# Patient Record
Sex: Female | Born: 1980 | Race: White | Hispanic: No | Smoking: Former smoker
Health system: Southern US, Community
[De-identification: ages and names within clinical notes are randomized; demographics above are authoritative.]

## PROBLEM LIST (undated history)

## (undated) DIAGNOSIS — F53 Postpartum depression: Secondary | ICD-10-CM

## (undated) DIAGNOSIS — G8929 Other chronic pain: Secondary | ICD-10-CM

## (undated) DIAGNOSIS — O99345 Other mental disorders complicating the puerperium: Secondary | ICD-10-CM

## (undated) DIAGNOSIS — F419 Anxiety disorder, unspecified: Secondary | ICD-10-CM

## (undated) DIAGNOSIS — R197 Diarrhea, unspecified: Secondary | ICD-10-CM

## (undated) DIAGNOSIS — T4145XA Adverse effect of unspecified anesthetic, initial encounter: Secondary | ICD-10-CM

## (undated) DIAGNOSIS — R519 Headache, unspecified: Secondary | ICD-10-CM

## (undated) DIAGNOSIS — S82891A Other fracture of right lower leg, initial encounter for closed fracture: Secondary | ICD-10-CM

## (undated) DIAGNOSIS — R51 Headache: Secondary | ICD-10-CM

## (undated) DIAGNOSIS — T8859XA Other complications of anesthesia, initial encounter: Secondary | ICD-10-CM

## (undated) DIAGNOSIS — M1990 Unspecified osteoarthritis, unspecified site: Secondary | ICD-10-CM

## (undated) DIAGNOSIS — M545 Low back pain, unspecified: Secondary | ICD-10-CM

## (undated) HISTORY — DX: Postpartum depression: F53.0

## (undated) HISTORY — PX: BREAST SURGERY: SHX581

## (undated) HISTORY — PX: FRACTURE SURGERY: SHX138

## (undated) HISTORY — DX: Diarrhea, unspecified: R19.7

## (undated) HISTORY — DX: Anxiety disorder, unspecified: F41.9

## (undated) HISTORY — DX: Other mental disorders complicating the puerperium: O99.345

---
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
## 2005-09-06 HISTORY — PX: LAPAROSCOPIC CHOLECYSTECTOMY: SUR755

## 2013-06-18 ENCOUNTER — Ambulatory Visit (HOSPITAL_BASED_OUTPATIENT_CLINIC_OR_DEPARTMENT_OTHER)
Admission: RE | Admit: 2013-06-18 | Discharge: 2013-06-18 | Disposition: A | Discharge status: Home / Self Care | Payer: BC Managed Care – PPO | Source: Ambulatory Visit | Attending: Family | Admitting: Family

## 2013-06-18 ENCOUNTER — Other Ambulatory Visit (HOSPITAL_COMMUNITY)
Admission: RE | Admit: 2013-06-18 | Discharge: 2013-06-18 | Disposition: A | Discharge status: Home / Self Care | Payer: BC Managed Care – PPO | Source: Ambulatory Visit | Attending: Family | Admitting: Family

## 2013-06-18 ENCOUNTER — Encounter: Payer: Self-pay | Admitting: Family

## 2013-06-18 ENCOUNTER — Ambulatory Visit (INDEPENDENT_AMBULATORY_CARE_PROVIDER_SITE_OTHER): Payer: BC Managed Care – PPO | Admitting: Family

## 2013-06-18 VITALS — BP 110/80 | HR 86 | Temp 98.5°F | Resp 18 | Ht 64.5 in | Wt 244.1 lb

## 2013-06-18 DIAGNOSIS — M51379 Other intervertebral disc degeneration, lumbosacral region without mention of lumbar back pain or lower extremity pain: Secondary | ICD-10-CM | POA: Insufficient documentation

## 2013-06-18 DIAGNOSIS — M5137 Other intervertebral disc degeneration, lumbosacral region: Secondary | ICD-10-CM | POA: Insufficient documentation

## 2013-06-18 DIAGNOSIS — Z803 Family history of malignant neoplasm of breast: Secondary | ICD-10-CM

## 2013-06-18 DIAGNOSIS — N760 Acute vaginitis: Secondary | ICD-10-CM | POA: Insufficient documentation

## 2013-06-18 DIAGNOSIS — Z113 Encounter for screening for infections with a predominantly sexual mode of transmission: Secondary | ICD-10-CM | POA: Insufficient documentation

## 2013-06-18 DIAGNOSIS — N949 Unspecified condition associated with female genital organs and menstrual cycle: Secondary | ICD-10-CM

## 2013-06-18 DIAGNOSIS — Z01419 Encounter for gynecological examination (general) (routine) without abnormal findings: Secondary | ICD-10-CM

## 2013-06-18 DIAGNOSIS — R102 Pelvic and perineal pain: Secondary | ICD-10-CM

## 2013-06-18 DIAGNOSIS — R1032 Left lower quadrant pain: Secondary | ICD-10-CM | POA: Insufficient documentation

## 2013-06-18 DIAGNOSIS — Z23 Encounter for immunization: Secondary | ICD-10-CM

## 2013-06-18 DIAGNOSIS — F411 Generalized anxiety disorder: Secondary | ICD-10-CM

## 2013-06-18 LAB — POCT URINALYSIS DIPSTICK
Glucose, UA: NEGATIVE
Nitrite, UA: NEGATIVE
Urobilinogen, UA: 0.2
pH, UA: 5

## 2013-06-18 MED ORDER — IOHEXOL 300 MG/ML  SOLN
100.0000 mL | Freq: Once | INTRAMUSCULAR | Status: AC | PRN
  Administered 2013-06-18: 100 mL via INTRAVENOUS

## 2013-06-18 NOTE — Assessment & Plan Note (Signed)
Stable on effexor. Continue same.  

## 2013-06-18 NOTE — Assessment & Plan Note (Signed)
She is s/p c section x 2. She is morbidly obese limiting physical exam.  Abdominal wall hernia is a possibility. Pap was performed today. Will also send for GC/Chlamydia, ua- urine culture if positive.  Check for gardnerella, candida, trich.  Send for CT abdomen and pelvis to further evaluate abdominal pain.

## 2013-06-18 NOTE — Progress Notes (Signed)
Subjective:    Patient ID: Gina James, female    DOB: 1981-03-18, 32 y.o.   MRN: [id number]  HPI  Ms. Gina James is a 32 yr old female who presents today to establish care. Her chief complaint today is that she has had constant abdominal cramping since July. Of note, she had a laparoscopy in the past to evaluate for possible endometriosis. She is requesting a referral to GYN.    She reports lower abdominal pain. Described as pressure, cramping. Worse if she tries to urinate, have bowel movement or if she lays on her stomach. Denies associated blood in stool or urine.  Discomfort is relieved after urination/defication.  Denies associated abdominal bulging.    Anxiety- diagnosed in 2006. Had post partum depression.  Was placed on effexor.  Report that her anxiety and depression are well controlled.    Review of Systems  Constitutional: Negative for fever.  HENT: Negative for rhinorrhea.   Respiratory: Negative for cough and shortness of breath.   Cardiovascular: Negative for chest pain.  Gastrointestinal: Negative for nausea, vomiting, diarrhea and constipation.  Genitourinary: Negative for dysuria.  Musculoskeletal: Negative for myalgias.       Reports some knee pain  Skin: Negative for rash.  Neurological:       + HA's.  Reports that she gets weekly headaches.  Has had migraines in the past.  Uses aleve for her headaches.   Hematological: Negative for adenopathy.   Past Medical History  Diagnosis Date  . Anxiety   . Post partum depression     History   Social History  . Marital Status: Married    Spouse Name: N/A    Number of Children: N/A  . Years of Education: N/A   Occupational History  . Not on file.   Social History Main Topics  . Smoking status: Former Smoker -- 8 years    Types: Cigarettes    Quit date: 09/06/2004  . Smokeless tobacco: Never Used  . Alcohol Use: Yes     Comment: once a month or less  . Drug Use: Not on file  . Sexual Activity: Not on file    Other Topics Concern  . Not on file   Social History Narrative   2 children- son 2007- Gina James,  Gina James 2009   Homemaker   Married   Completed tech school   Enjoys shopping, spending time with friends, reading.           Past Surgical History  Procedure Laterality Date  . Cholecystectomy  2007  . Other surgical history  1997    "laproscopy to check for endometriosis"    Family History  Problem Relation Age of Onset  . Diabetes Father   . Cancer Sister 30    breast, had chemo/double mastectomy  . Heart disease Maternal Grandfather   . Heart disease Paternal Grandfather     Allergies not on file  No current outpatient prescriptions on file prior to visit.   No current facility-administered medications on file prior to visit.    BP 110/80  Pulse 86  Temp(Src) 98.5 F (36.9 C) (Oral)  Resp 18  Ht 5' 4.5" (1.638 m)  Wt 244 lb 1.3 oz (110.714 kg)  BMI 41.26 kg/m2  SpO2 99%  LMP 06/01/2013       Objective:   Physical Exam  Constitutional: She is oriented to person, place, and time. She appears well-developed and well-nourished. No distress.  HENT:  Head: Normocephalic and atraumatic.  Eyes:  No scleral icterus.  Cardiovascular: Normal rate and regular rhythm.   No murmur heard. Pulmonary/Chest: Effort normal and breath sounds normal. No respiratory distress. She has no wheezes. She has no rales. She exhibits no tenderness.  Abdominal: Soft. Bowel sounds are normal. There is tenderness in the right lower quadrant and left lower quadrant.  Genitourinary:  Neg CMT Normal adnexa Small amount of white discharge noted in vagina No lesions Cervix is normal.    Musculoskeletal: She exhibits no edema.  Lymphadenopathy:    She has no cervical adenopathy.  Neurological: She is alert and oriented to person, place, and time.  Skin: Skin is warm and dry.  Psychiatric: She has a normal mood and affect. Her behavior is normal. Judgment and thought content normal.           Assessment & Plan:

## 2013-06-18 NOTE — Addendum Note (Signed)
Addended by: Mervin Kung A on: 06/18/2013 04:26 PM   Modules accepted: Orders

## 2013-06-18 NOTE — Patient Instructions (Signed)
Please complete your CT scan on the first floor. You will be contacted about your referral to GYN. Follow up in 1 month for a fasting physical. Welcome to [facility]!

## 2013-06-20 LAB — URINE CULTURE
Colony Count: NO GROWTH
Organism ID, Bacteria: NO GROWTH

## 2013-06-21 ENCOUNTER — Telehealth: Payer: Self-pay | Admitting: Family

## 2013-06-21 MED ORDER — METRONIDAZOLE 0.75 % VA GEL: 1.0000 | Freq: Every day | VAGINAL | Status: DC

## 2013-06-21 NOTE — Telephone Encounter (Signed)
Please call patient and let her know pap is negative.  GC/Chlamydia testing negative.  + bacterial vaginosis. Rx with metrogel.

## 2013-06-22 NOTE — Telephone Encounter (Signed)
Notifed pt and she voices understanding. Concerned that she may be starting her menstrual cycle in the next 3 days. Per verbal from Provider, advised pt that is shouldn't interfere with medication absorption as long as she doesn't use tampons while on medication. Pt declines oral form due to increased risk of GI side effects.

## 2013-06-29 ENCOUNTER — Telehealth: Payer: Self-pay | Admitting: Genetic Counselor

## 2013-06-29 NOTE — Telephone Encounter (Signed)
Called pt to schedule genetic appt per pt she wants to hold off on scheduling appt after speaking with MD. Jeanene Erb referring office to make aware spoke with Sue Lush.

## 2013-07-17 ENCOUNTER — Encounter: Payer: BC Managed Care – PPO | Admitting: Family

## 2013-09-03 ENCOUNTER — Ambulatory Visit (INDEPENDENT_AMBULATORY_CARE_PROVIDER_SITE_OTHER): Payer: BC Managed Care – PPO | Admitting: Physician Assistant

## 2013-09-03 ENCOUNTER — Encounter: Payer: Self-pay | Admitting: Physician Assistant

## 2013-09-03 VITALS — BP 112/76 | HR 79 | Temp 98.2°F | Resp 16 | Ht 64.5 in | Wt 247.5 lb

## 2013-09-03 DIAGNOSIS — M545 Low back pain, unspecified: Secondary | ICD-10-CM

## 2013-09-03 DIAGNOSIS — M5380 Other specified dorsopathies, site unspecified: Secondary | ICD-10-CM

## 2013-09-03 DIAGNOSIS — M62830 Muscle spasm of back: Secondary | ICD-10-CM

## 2013-09-03 MED ORDER — METAXALONE 800 MG PO TABS: 800.0000 mg | ORAL_TABLET | Freq: Three times a day (TID) | ORAL | Status: DC

## 2013-09-03 MED ORDER — TRAMADOL HCL 50 MG PO TABS: 50.0000 mg | ORAL_TABLET | Freq: Two times a day (BID) | ORAL | Status: DC | PRN

## 2013-09-03 NOTE — Progress Notes (Signed)
Pre visit review using our clinic review tool, if applicable. No additional management support is needed unless otherwise documented below in the visit note/SLS  

## 2013-09-03 NOTE — Patient Instructions (Signed)
Please continue with Aleve and [place].  Avoid heavy lifting or over use.  Continue home TENS unit.  You will be contacted by Physical therapy.  Skelaxin as needed.  Tramadol to be reserved only for severe pain if needed.  Please call or return to clinic if symptoms not improving.

## 2013-09-04 DIAGNOSIS — M545 Low back pain, unspecified: Secondary | ICD-10-CM | POA: Insufficient documentation

## 2013-09-04 NOTE — Assessment & Plan Note (Signed)
Rx Skelaxin. Continue Aleve as needed for pain. Rx tramadol for severe pain only. Apply topical icy hot or Aspercreme to affected area. Use TENS unit as prescribed. Will place referral to physical therapy. Encouraged exercise and weight loss after this flare-up has resolved. Likely to have recurrences of symptoms giving morbid obesity.

## 2013-09-04 NOTE — Progress Notes (Signed)
Patient ID: Gina James, female   DOB: 06/17/1981, 32 y.o.   MRN: [id number]  Patient presents to clinic today complaining of low back pain. Patient states this issue is chronic, starting after she gave birth to her child in 2009. Patient states she has occasional flareups of back pain that requires muscle relaxer and physical therapy.  Patient is out of her prescription for Skelaxin. Patient also states she's not been set up with physical therapy since moving to the area. Patient states that pain is constant and worse with motion. Denies heavy lifting or trauma. 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 Patient has taken Aleve for her symptoms with minimal relief of pain. Patient denies weakness, numbness or tingling of lower extremities. Denies radiation of back pain into her extremities.  Past Medical History  Diagnosis Date  . Anxiety   . Post partum depression     Current Outpatient Prescriptions on File Prior to Visit  Medication Sig Dispense Refill  . venlafaxine XR (EFFEXOR-XR) 150 MG 24 hr capsule Take 150 mg by mouth daily.      Marland Kitchen venlafaxine XR (EFFEXOR-XR) 37.5 MG 24 hr capsule Take 37.5 mg by mouth daily.       No current facility-administered medications on file prior to visit.    Allergies  Allergen Reactions  . Amoxicillin Hives  . Ceclor [Cefaclor] Hives  . Penicillins Hives  . Sulfa Antibiotics Hives  . Zithromax [Azithromycin] Hives    Family History  Problem Relation Age of Onset  . Diabetes Father   . Cancer Sister 30    breast, had chemo/double mastectomy  . Heart disease Maternal Grandfather   . Heart disease Paternal Grandfather     History   Social History  . Marital Status: Married    Spouse Name: N/A    Number of Children: N/A  . Years of Education: N/A   Social History Main Topics  . Smoking status: Former Smoker -- 8 years    Types: Cigarettes    Quit  date: 09/06/2004  . Smokeless tobacco: Never Used  . Alcohol Use: Yes     Comment: once a month or less  . Drug Use: None  . Sexual Activity: None   Other Topics Concern  . None   Social History Narrative   2 children- son 2007- Sean,  Taryn 2009   Homemaker   Married   Completed tech school   Enjoys shopping, spending time with friends, reading.          Review of Systems - See HPI.  All other ROS are negative.  Filed Vitals:   09/03/13 1111  BP: 112/76  Pulse: 79  Temp: 98.2 F (36.8 C)  Resp: 16   Physical Exam  Vitals reviewed. Constitutional: She is oriented to person, place, and time.  Well-developed, morbidly obese Caucasian female in no acute distress.  HENT:  Head: Normocephalic and atraumatic.  Eyes: Conjunctivae are normal.  Neck: Neck supple.  Cardiovascular: Normal rate, regular rhythm, normal heart sounds and intact distal pulses.   Pulmonary/Chest: Effort normal and breath sounds normal.  Musculoskeletal:       Right hip: Normal.       Left hip: Normal.       Cervical back: Normal.       Thoracic back: Normal.       Lumbar back: She exhibits tenderness, pain and spasm. She  exhibits normal range of motion, no bony tenderness and no deformity.       Right upper leg: Normal.       Left upper leg: Normal.  Neurological: She is alert and oriented to person, place, and time. No cranial nerve deficit. Gait normal.  Skin: Skin is warm and dry. No rash noted.  Psychiatric: Affect normal.    Recent Results (from the past 2160 hour(s))  POCT URINALYSIS DIPSTICK     Status: None   Collection Time    06/18/13  4:17 PM      Result Value Range   Color, UA dark yellow     Clarity, UA cloudy     Glucose, UA negative     Bilirubin, UA moderate     Ketones, UA moderate     Spec Grav, UA >=1.030     Blood, UA trace     pH, UA 5.0     Protein, UA trace     Urobilinogen, UA 0.2     Nitrite, UA negative     Leukocytes, UA Negative    POCT URINE HCG BY  VISUAL COLOR COMPARISON TESTS     Status: None   Collection Time    06/18/13  4:17 PM      Result Value Range   Preg Test, Ur Negative    URINE CULTURE     Status: None   Collection Time    06/18/13  4:21 PM      Result Value Range   Colony Count NO GROWTH     Organism ID, Bacteria NO GROWTH      Assessment/Plan: Low back pain Rx Skelaxin. Continue Aleve as needed for pain. Rx tramadol for severe pain only. Apply topical icy hot or Aspercreme to affected area. Use TENS unit as prescribed. Will place referral to physical therapy. Encouraged exercise and weight loss after this flare-up has resolved. Likely to have recurrences of symptoms giving morbid obesity.

## 2013-09-07 ENCOUNTER — Telehealth: Payer: Self-pay | Admitting: Family

## 2013-09-07 DIAGNOSIS — M545 Low back pain, unspecified: Secondary | ICD-10-CM

## 2013-09-07 NOTE — Telephone Encounter (Signed)
I can place a referral to Neurosurgery but it will be elective.  Patient did not have any neurological signs/symptoms at her visit, nor does she have imaging within our system except for a CT abdomen/pelvis which incidentally picked up arthritis changes of her lower spine.  Neurosurgeon will most likely not see her without her having some imaging to review.  I will still place the referral but my suggestion to the patient would be for her to return to clinic if symptoms not improving or if new symptoms have developed.  If pain is severe and she cannot get appointment, she should proceed to the ER.

## 2013-09-07 NOTE — Telephone Encounter (Signed)
Patient states that she has been taking the medication that was given to her but her back is not getting any better.

## 2013-09-07 NOTE — Telephone Encounter (Signed)
Called pt and informed her that we will need records from prior ortho before referral. Pt is request to see a Neurosurgeon instead of ortho.

## 2013-09-07 NOTE — Telephone Encounter (Signed)
Pt called back, informed pt. States her therapy does not start till Monday. She is requesting to be referred to see a specialists. Please advise.

## 2013-09-07 NOTE — Telephone Encounter (Signed)
Low back pain can take up to a few weeks to completely dissipate. Can take 2 of the tramadol every 12 hours instead of 1.  Has she been scheduled for physical therapy yet?  If pain is not improving over the weekend, then we will need to have her come back in for re-assessment and imaging.

## 2013-09-07 NOTE — Telephone Encounter (Signed)
Patient informed, understood & agreed; appt scheduled Mon, 01.05.15 at 1:15p for reassessment/SLS

## 2013-09-07 NOTE — Telephone Encounter (Signed)
Referral given.  Patient will be contacted by Specialist.

## 2013-09-10 ENCOUNTER — Ambulatory Visit (INDEPENDENT_AMBULATORY_CARE_PROVIDER_SITE_OTHER): Payer: BC Managed Care – PPO | Admitting: Physician Assistant

## 2013-09-10 ENCOUNTER — Encounter: Payer: Self-pay | Admitting: Physician Assistant

## 2013-09-10 VITALS — BP 106/72 | HR 84 | Temp 98.6°F | Resp 16 | Ht 64.5 in | Wt 243.8 lb

## 2013-09-10 DIAGNOSIS — M79605 Pain in left leg: Secondary | ICD-10-CM

## 2013-09-10 DIAGNOSIS — M545 Low back pain, unspecified: Secondary | ICD-10-CM

## 2013-09-10 DIAGNOSIS — M5380 Other specified dorsopathies, site unspecified: Secondary | ICD-10-CM

## 2013-09-10 DIAGNOSIS — M79604 Pain in right leg: Secondary | ICD-10-CM

## 2013-09-10 DIAGNOSIS — M62830 Muscle spasm of back: Secondary | ICD-10-CM

## 2013-09-10 MED ORDER — METHYLPREDNISOLONE (PAK) 4 MG PO TABS: ORAL_TABLET | ORAL | Status: DC

## 2013-09-10 MED ORDER — METAXALONE 800 MG PO TABS: 800.0000 mg | ORAL_TABLET | Freq: Three times a day (TID) | ORAL | Status: DC

## 2013-09-10 NOTE — Progress Notes (Signed)
Pre visit review using our clinic review tool, if applicable. No additional management support is needed unless otherwise documented below in the visit note/SLS  

## 2013-09-10 NOTE — Patient Instructions (Signed)
Continue muscle relaxer as prescribed.  Tramadol at night.  Tylenol during the day.  Take steroid pack as prescribed.  Follow-up with physical therapy tomorrow if you are able.  Will obtain an MRI of lumbar spine. Will change status of Neurology/Neurosurgery referral once we have your medical records.  If you develop numbness in your groin, fecal or urinary incontinence, inability to urinate, or severe pain, please proceed immediately to the ER.

## 2013-09-10 NOTE — Progress Notes (Signed)
Subjective:    Patient ID: Gina James, female    DOB: 03-Jan-1981, 33 y.o.   MRN: [id number]  HPI Patient presents to clinic today complaining of continued low back pain is now radiating into her hips and lower extremities bilaterally. Patient has been taking her skull ask him with some relief of symptoms. Has prescription for tramadol, but states it makes her drowsy, so she is only taken it before bedtime. Patient denies any recent trauma or reinjury since her last visit. Patient denies saddle anesthesia, urinary retention/incontinence or bowel retention/incontinence. Patient starts physical therapy tomorrow for her back pain.  Past Medical History  Diagnosis Date  . Anxiety   . Post partum depression    Current Outpatient Prescriptions on File Prior to Visit  Medication Sig Dispense Refill  . traMADol (ULTRAM) 50 MG tablet Take 1 tablet (50 mg total) by mouth every 12 (twelve) hours as needed for severe pain.  30 tablet  0  . venlafaxine XR (EFFEXOR-XR) 150 MG 24 hr capsule Take 150 mg by mouth daily.      Marland Kitchen venlafaxine XR (EFFEXOR-XR) 37.5 MG 24 hr capsule Take 37.5 mg by mouth daily.       No current facility-administered medications on file prior to visit.   Allergies  Allergen Reactions  . Amoxicillin Hives  . Ceclor [Cefaclor] Hives  . Penicillins Hives  . Sulfa Antibiotics Hives  . Zithromax [Azithromycin] Hives   Family History  Problem Relation Age of Onset  . Diabetes Father   . Cancer Sister 30    breast, had chemo/double mastectomy  . Heart disease Maternal Grandfather   . Heart disease Paternal Grandfather    History   Social History  . Marital Status: Married    Spouse Name: N/A    Number of Children: N/A  . Years of Education: N/A   Social History Main Topics  . Smoking status: Former Smoker -- 8 years    Types: Cigarettes    Quit date: 09/06/2004  . Smokeless tobacco: Never Used  . Alcohol Use: Yes     Comment: once a month or less  . Drug Use:  None  . Sexual Activity: None   Other Topics Concern  . None   Social History Narrative   2 children- son 2007- Sean,  Taryn 2009   Homemaker   Married   Completed tech school   Enjoys shopping, spending time with friends, reading.          Review of Systems  Constitutional: Negative.   Respiratory: Negative.   Cardiovascular: Negative.   Genitourinary: Negative.   Musculoskeletal: Positive for back pain. Negative for myalgias.  Neurological: Negative for syncope, weakness and numbness.      Objective:   Physical Exam  Vitals reviewed. Constitutional: She is oriented to person, place, and time. She appears well-developed and well-nourished.  HENT:  Head: Normocephalic and atraumatic.  Eyes: Conjunctivae and EOM are normal.  Neck: Neck supple.  Cardiovascular: Normal rate, regular rhythm, normal heart sounds and intact distal pulses.   Pulmonary/Chest: Breath sounds normal. No respiratory distress. She has no wheezes. She has no rales. She exhibits no tenderness.  Musculoskeletal:       Lumbar back: She exhibits tenderness, pain and spasm. She exhibits no bony tenderness and no swelling.       Right upper leg: She exhibits no tenderness, no bony tenderness and no swelling.       Left upper leg: She exhibits no tenderness, no  bony tenderness and no swelling.  Pain is worse with movement. Pain is somewhat alleviated by lying down.  Neurological: She is alert and oriented to person, place, and time. She has normal reflexes. No cranial nerve deficit. Coordination normal.  Skin: Skin is warm and dry. No rash noted.  Psychiatric: She has a normal mood and affect.    Assessment & Plan:  See assessment and plan notes.

## 2013-09-11 ENCOUNTER — Ambulatory Visit (HOSPITAL_BASED_OUTPATIENT_CLINIC_OR_DEPARTMENT_OTHER)
Admission: RE | Admit: 2013-09-11 | Discharge: 2013-09-11 | Disposition: A | Discharge status: Home / Self Care | Payer: BC Managed Care – PPO | Source: Ambulatory Visit | Attending: Physician Assistant | Admitting: Physician Assistant

## 2013-09-11 ENCOUNTER — Ambulatory Visit: Payer: BC Managed Care – PPO | Attending: Physician Assistant | Admitting: Physical Therapy

## 2013-09-11 ENCOUNTER — Telehealth: Payer: Self-pay | Admitting: Physician Assistant

## 2013-09-11 DIAGNOSIS — IMO0001 Reserved for inherently not codable concepts without codable children: Secondary | ICD-10-CM | POA: Insufficient documentation

## 2013-09-11 DIAGNOSIS — M545 Low back pain, unspecified: Secondary | ICD-10-CM | POA: Insufficient documentation

## 2013-09-11 DIAGNOSIS — M79605 Pain in left leg: Secondary | ICD-10-CM

## 2013-09-11 DIAGNOSIS — R293 Abnormal posture: Secondary | ICD-10-CM | POA: Insufficient documentation

## 2013-09-11 DIAGNOSIS — M5126 Other intervertebral disc displacement, lumbar region: Secondary | ICD-10-CM | POA: Insufficient documentation

## 2013-09-11 DIAGNOSIS — M79604 Pain in right leg: Secondary | ICD-10-CM

## 2013-09-11 DIAGNOSIS — M6281 Muscle weakness (generalized): Secondary | ICD-10-CM | POA: Insufficient documentation

## 2013-09-11 HISTORY — DX: Pain in right leg: M79.604

## 2013-09-11 HISTORY — DX: Pain in left leg: M79.605

## 2013-09-11 NOTE — Assessment & Plan Note (Signed)
Will obtain MRI. Referral to  orthopedics or neurosurgery to placed once imaging results are reviewed. Depo-Medrol injection given in clinic. Rx Medrol Dosepak. Refill Skelaxin and tramadol. Continue physical therapy. Avoid heavy lifting or over exertion. Patient educated on alarm symptoms and when to proceed to emergency department.

## 2013-09-11 NOTE — Telephone Encounter (Signed)
Please inform patient that MRI does show evidence of nerve compression at her sacrum that could be cause of her symptoms.  Also shows significant degenerative changes (arthritis).  Will change referral to Neurosurgery from Elective to Routine. She should be contacted by their office. She should continue physical therapy and steroid pack as prescribed.

## 2013-09-12 MED ORDER — METHYLPREDNISOLONE ACETATE 80 MG/ML IJ SUSP
80.0000 mg | Freq: Once | INTRAMUSCULAR | Status: AC
  Administered 2013-09-10: 80 mg via INTRAMUSCULAR

## 2013-09-12 NOTE — Addendum Note (Signed)
Addended by: Rockwell Germany on: 09/12/2013 08:47 AM   Modules accepted: Orders

## 2013-09-12 NOTE — Telephone Encounter (Signed)
Patient informed, understood & agreed/SLS  

## 2013-09-14 ENCOUNTER — Ambulatory Visit: Payer: BC Managed Care – PPO | Admitting: Physical Therapy

## 2013-09-18 ENCOUNTER — Ambulatory Visit: Payer: BC Managed Care – PPO | Admitting: Physical Therapy

## 2013-09-20 ENCOUNTER — Other Ambulatory Visit: Payer: Self-pay | Admitting: Neurosurgery

## 2013-09-20 DIAGNOSIS — M5126 Other intervertebral disc displacement, lumbar region: Secondary | ICD-10-CM

## 2013-09-21 ENCOUNTER — Ambulatory Visit: Payer: BC Managed Care – PPO | Admitting: Physical Therapy

## 2013-09-24 ENCOUNTER — Ambulatory Visit
Admission: RE | Admit: 2013-09-24 | Discharge: 2013-09-24 | Disposition: A | Discharge status: Home / Self Care | Payer: BC Managed Care – PPO | Source: Ambulatory Visit | Attending: Neurosurgery | Admitting: Neurosurgery

## 2013-09-24 ENCOUNTER — Ambulatory Visit: Payer: BC Managed Care – PPO | Admitting: Physical Therapy

## 2013-09-24 VITALS — BP 144/76 | HR 74

## 2013-09-24 DIAGNOSIS — M5126 Other intervertebral disc displacement, lumbar region: Secondary | ICD-10-CM

## 2013-09-24 MED ORDER — METHYLPREDNISOLONE ACETATE 40 MG/ML INJ SUSP (RADIOLOG
120.0000 mg | Freq: Once | INTRAMUSCULAR | Status: AC
  Administered 2013-09-24: 120 mg via EPIDURAL

## 2013-09-24 MED ORDER — IOHEXOL 180 MG/ML  SOLN
1.0000 mL | Freq: Once | INTRAMUSCULAR | Status: AC | PRN
  Administered 2013-09-24: 1 mL via EPIDURAL

## 2013-09-24 NOTE — Discharge Instructions (Signed)

## 2013-09-25 ENCOUNTER — Ambulatory Visit: Payer: BC Managed Care – PPO | Admitting: Physical Therapy

## 2013-10-02 ENCOUNTER — Ambulatory Visit: Payer: BC Managed Care – PPO | Admitting: Physical Therapy

## 2013-10-05 ENCOUNTER — Ambulatory Visit: Payer: BC Managed Care – PPO | Admitting: Physical Therapy

## 2013-10-09 ENCOUNTER — Telehealth: Payer: Self-pay | Admitting: Family

## 2013-10-09 NOTE — Telephone Encounter (Signed)
Patient has an abcess and dentist wants to know what antibiotic the patient can take

## 2013-10-09 NOTE — Telephone Encounter (Signed)
Reviewed antibiotic options-  cipro and flagyl should be OK for her and provide gram + and gram - coverage.  Phoned Dr. Glennon Hamilton, discussed recommendations with his assistant Amy.

## 2013-10-09 NOTE — Telephone Encounter (Signed)
Please see pt's allergy list, please advise.

## 2013-11-23 ENCOUNTER — Encounter: Payer: Self-pay | Admitting: Family

## 2013-11-23 ENCOUNTER — Ambulatory Visit (INDEPENDENT_AMBULATORY_CARE_PROVIDER_SITE_OTHER): Payer: BC Managed Care – PPO | Admitting: Family

## 2013-11-23 VITALS — BP 118/78 | HR 82 | Temp 98.7°F | Resp 16 | Ht 64.5 in | Wt 236.0 lb

## 2013-11-23 DIAGNOSIS — M25569 Pain in unspecified knee: Secondary | ICD-10-CM

## 2013-11-23 DIAGNOSIS — F411 Generalized anxiety disorder: Secondary | ICD-10-CM

## 2013-11-23 DIAGNOSIS — M5380 Other specified dorsopathies, site unspecified: Secondary | ICD-10-CM

## 2013-11-23 DIAGNOSIS — M23209 Derangement of unspecified meniscus due to old tear or injury, unspecified knee: Secondary | ICD-10-CM

## 2013-11-23 DIAGNOSIS — M23302 Other meniscus derangements, unspecified lateral meniscus, unspecified knee: Secondary | ICD-10-CM

## 2013-11-23 DIAGNOSIS — M62830 Muscle spasm of back: Secondary | ICD-10-CM

## 2013-11-23 MED ORDER — VENLAFAXINE HCL ER 37.5 MG PO CP24: 37.5000 mg | ORAL_CAPSULE | Freq: Every day | ORAL | Status: DC

## 2013-11-23 MED ORDER — METAXALONE 800 MG PO TABS: 800.0000 mg | ORAL_TABLET | Freq: Every day | ORAL | Status: DC | PRN

## 2013-11-23 MED ORDER — VENLAFAXINE HCL ER 150 MG PO CP24: 150.0000 mg | ORAL_CAPSULE | Freq: Every day | ORAL | Status: DC
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
## 2013-11-23 NOTE — Patient Instructions (Addendum)
You will be contacted about your referral to ortho. Follow up in 6 months, sooner if problems/concerns.

## 2013-11-23 NOTE — Assessment & Plan Note (Signed)
Will refer to ortho for further evaluation.  

## 2013-11-23 NOTE — Progress Notes (Signed)
Pre visit review using our clinic review tool, if applicable. No additional management support is needed unless otherwise documented below in the visit note. 

## 2013-11-23 NOTE — Assessment & Plan Note (Signed)
Stable on effexor, continue same. 

## 2013-12-17 ENCOUNTER — Ambulatory Visit (INDEPENDENT_AMBULATORY_CARE_PROVIDER_SITE_OTHER): Payer: BC Managed Care – PPO | Admitting: Family

## 2013-12-17 ENCOUNTER — Encounter: Payer: Self-pay | Admitting: Family

## 2013-12-17 VITALS — BP 130/74 | HR 82 | Temp 98.2°F | Resp 16 | Ht 64.5 in | Wt 230.1 lb

## 2013-12-17 DIAGNOSIS — N760 Acute vaginitis: Secondary | ICD-10-CM

## 2013-12-17 MED ORDER — FLUCONAZOLE 150 MG PO TABS: ORAL_TABLET | ORAL | Status: DC

## 2013-12-17 NOTE — Assessment & Plan Note (Signed)
Wet prep/GC chlamydia performed today. Empiric rx with diflucan. Await results.

## 2013-12-17 NOTE — Patient Instructions (Signed)
Start diflucan for itching. Call if symptoms worsen, or if symptoms do not improve.  Schedule complete physical at the front desk.

## 2013-12-17 NOTE — Progress Notes (Signed)
Pre visit review using our clinic review tool, if applicable. No additional management support is needed unless otherwise documented below in the visit note. 

## 2013-12-17 NOTE — Addendum Note (Signed)
Addended by: Kelle Darting A on: 12/17/2013 05:52 PM   Modules accepted: Orders

## 2013-12-17 NOTE — Progress Notes (Signed)
Subjective:    Patient ID: Gina James, female    DOB: 07/03/1981, 33 y.o.   MRN: [id number]  HPI  Gina James is a 33 yr old female who presents today with chief complaint of vaginal itching.Reports that symptoms have been present for > 1 week.  Used Monistat 1 day, monistat 3 day. Swelling has resolved but continues to have itching.    Review of Systems See HPI  Past Medical History  Diagnosis Date  . Anxiety   . Post partum depression     History   Social History  . Marital Status: Married    Spouse Name: N/A    Number of Children: N/A  . Years of Education: N/A   Occupational History  . Not on file.   Social History Main Topics  . Smoking status: Former Smoker -- 8 years    Types: Cigarettes    Quit date: 09/06/2004  . Smokeless tobacco: Never Used  . Alcohol Use: Yes     Comment: once a month or less  . Drug Use: Not on file  . Sexual Activity: Not on file   Other Topics Concern  . Not on file   Social History Narrative   2 children- son 2007- Gina James,  Gina James 2009   Homemaker   Married   Completed tech school   Enjoys shopping, spending time with friends, reading.           Past Surgical History  Procedure Laterality Date  . Cholecystectomy  2007  . Other surgical history  1997    "laproscopy to check for endometriosis"  . Cesarean section  2007 and 2009    Family History  Problem Relation Age of Onset  . Diabetes Father   . Cancer Sister 30    breast, had chemo/double mastectomy  . Heart disease Maternal Grandfather   . Heart disease Paternal Grandfather     Allergies  Allergen Reactions  . Amoxicillin Hives  . Ceclor [Cefaclor] Hives  . Penicillins Hives  . Sulfa Antibiotics Hives  . Zithromax [Azithromycin] Hives    Current Outpatient Prescriptions on File Prior to Visit  Medication Sig Dispense Refill  . metaxalone (SKELAXIN) 800 MG tablet Take 1 tablet (800 mg total) by mouth daily as needed for muscle spasms.  30 tablet  0    . venlafaxine XR (EFFEXOR-XR) 150 MG 24 hr capsule Take 1 capsule (150 mg total) by mouth daily.  30 capsule  5  . venlafaxine XR (EFFEXOR-XR) 37.5 MG 24 hr capsule Take 1 capsule (37.5 mg total) by mouth daily.  30 capsule  5   No current facility-administered medications on file prior to visit.    BP 130/74  Pulse 82  Temp(Src) 98.2 F (36.8 C) (Oral)  Resp 16  Ht 5' 4.5" (1.638 m)  Wt 230 lb 1.3 oz (104.364 kg)  BMI 38.90 kg/m2  SpO2 99%  LMP 11/23/2013       Objective:   Physical Exam  Constitutional: She is oriented to person, place, and time. She appears well-developed and well-nourished. No distress.  HENT:  Head: Normocephalic and atraumatic.  Cardiovascular: Normal rate and regular rhythm.   No murmur heard. Pulmonary/Chest: Effort normal and breath sounds normal. No respiratory distress. She has no wheezes. She has no rales. She exhibits no tenderness.  Genitourinary: There is no rash on the right labia. There is no rash on the left labia.  Some thin white discharge is noted. Neg CMT  Neurological: She  is alert and oriented to person, place, and time.  Psychiatric: She has a normal mood and affect. Her behavior is normal. Judgment and thought content normal.          Assessment & Plan:

## 2013-12-19 ENCOUNTER — Encounter: Payer: Self-pay | Admitting: Family

## 2013-12-19 LAB — WET PREP BY MOLECULAR PROBE
CANDIDA SPECIES: NEGATIVE
GARDNERELLA VAGINALIS: NEGATIVE
TRICHOMONAS VAG: NEGATIVE

## 2013-12-19 LAB — GC/CHLAMYDIA PROBE AMP
CT Probe RNA: NEGATIVE
GC PROBE AMP APTIMA: NEGATIVE

## 2014-01-11 ENCOUNTER — Ambulatory Visit: Payer: BC Managed Care – PPO | Admitting: Physical Therapy

## 2014-01-17 ENCOUNTER — Ambulatory Visit: Payer: BC Managed Care – PPO | Attending: Orthopaedic Surgery | Admitting: Physical Therapy

## 2014-01-17 DIAGNOSIS — M545 Low back pain, unspecified: Secondary | ICD-10-CM | POA: Diagnosis not present

## 2014-01-17 DIAGNOSIS — M25569 Pain in unspecified knee: Secondary | ICD-10-CM | POA: Insufficient documentation

## 2014-01-17 DIAGNOSIS — R262 Difficulty in walking, not elsewhere classified: Secondary | ICD-10-CM | POA: Insufficient documentation

## 2014-01-17 DIAGNOSIS — IMO0001 Reserved for inherently not codable concepts without codable children: Secondary | ICD-10-CM | POA: Diagnosis not present

## 2014-01-31 ENCOUNTER — Ambulatory Visit: Payer: BC Managed Care – PPO | Admitting: Physical Therapy

## 2014-01-31 DIAGNOSIS — IMO0001 Reserved for inherently not codable concepts without codable children: Secondary | ICD-10-CM | POA: Diagnosis not present

## 2014-02-20 ENCOUNTER — Ambulatory Visit: Payer: BC Managed Care – PPO | Admitting: Physical Therapy

## 2014-02-28 ENCOUNTER — Other Ambulatory Visit (HOSPITAL_COMMUNITY)
Admission: RE | Admit: 2014-02-28 | Discharge: 2014-02-28 | Disposition: A | Discharge status: Home / Self Care | Payer: BC Managed Care – PPO | Source: Ambulatory Visit | Attending: Physician Assistant | Admitting: Physician Assistant

## 2014-02-28 ENCOUNTER — Encounter: Payer: Self-pay | Admitting: Physician Assistant

## 2014-02-28 ENCOUNTER — Ambulatory Visit (INDEPENDENT_AMBULATORY_CARE_PROVIDER_SITE_OTHER): Payer: BC Managed Care – PPO | Admitting: Physician Assistant

## 2014-02-28 VITALS — BP 102/72 | HR 78 | Temp 99.3°F | Resp 16 | Ht 64.5 in | Wt 208.5 lb

## 2014-02-28 DIAGNOSIS — N760 Acute vaginitis: Secondary | ICD-10-CM

## 2014-02-28 DIAGNOSIS — B373 Candidiasis of vulva and vagina: Secondary | ICD-10-CM

## 2014-02-28 DIAGNOSIS — B3731 Acute candidiasis of vulva and vagina: Secondary | ICD-10-CM

## 2014-02-28 DIAGNOSIS — Z113 Encounter for screening for infections with a predominantly sexual mode of transmission: Secondary | ICD-10-CM | POA: Insufficient documentation

## 2014-02-28 MED ORDER — FLUCONAZOLE 150 MG PO TABS: 150.0000 mg | ORAL_TABLET | Freq: Once | ORAL | Status: DC

## 2014-02-28 NOTE — Patient Instructions (Signed)
Please take medication as directed.  Avoid soaps with dyes or perfumes as they can irritate the vagina.  Take a daily probiotic.  I will call you with your test results.  Call or return to clinic if symptoms worsen or new symptoms develop.

## 2014-02-28 NOTE — Progress Notes (Signed)
Pre visit review using our clinic review tool, if applicable. No additional management support is needed unless otherwise documented below in the visit note/SLS  

## 2014-03-03 NOTE — Progress Notes (Signed)
Patient presents to clinic today c/o vaginal pruritus and white discharge.  Endorses a couple of yeast infections over the past couple of months.  Endorses some improvement with OTC treatments, but symptoms recur.  Patient is sexually active with one female partner.  Denies concern for STD.  Patient has been using feminine soaps to clean the vaginal area.  Past Medical History  Diagnosis Date  . Anxiety   . Post partum depression     Current Outpatient Prescriptions on File Prior to Visit  Medication Sig Dispense Refill  . metaxalone (SKELAXIN) 800 MG tablet Take 1 tablet (800 mg total) by mouth daily as needed for muscle spasms.  30 tablet  0  . pyridOXINE (VITAMIN B-6) 100 MG tablet Take 1,000 mg by mouth daily.      Marland Kitchen venlafaxine XR (EFFEXOR-XR) 150 MG 24 hr capsule Take 1 capsule (150 mg total) by mouth daily.  30 capsule  5  . venlafaxine XR (EFFEXOR-XR) 37.5 MG 24 hr capsule Take 1 capsule (37.5 mg total) by mouth daily.  30 capsule  5   No current facility-administered medications on file prior to visit.    Allergies  Allergen Reactions  . Amoxicillin Hives  . Ceclor [Cefaclor] Hives  . Penicillins Hives  . Sulfa Antibiotics Hives  . Zithromax [Azithromycin] Hives    Family History  Problem Relation Age of Onset  . Diabetes Father   . Cancer Sister 30    breast, had chemo/double mastectomy  . Heart disease Maternal Grandfather   . Heart disease Paternal Grandfather     History   Social History  . Marital Status: Married    Spouse Name: N/A    Number of Children: N/A  . Years of Education: N/A   Social History Main Topics  . Smoking status: Former Smoker -- 8 years    Types: Cigarettes    Quit date: 09/06/2004  . Smokeless tobacco: Never Used  . Alcohol Use: Yes     Comment: once a month or less  . Drug Use: None  . Sexual Activity: None   Other Topics Concern  . None   Social History Narrative   2 children- son 2007- Sean,  Taryn 2009   Homemaker   Married   Completed tech school   Enjoys shopping, spending time with friends, reading.           Review of Systems - See HPI.  All other ROS are negative.  BP 102/72  Pulse 78  Temp(Src) 99.3 F (37.4 C) (Oral)  Resp 16  Ht 5' 4.5" (1.638 m)  Wt 208 lb 8 oz (94.575 kg)  BMI 35.25 kg/m2  SpO2 99%  LMP 02/09/2014  Physical Exam  Vitals reviewed. Cardiovascular: Normal rate, regular rhythm, normal heart sounds and intact distal pulses.   Pulmonary/Chest: Effort normal and breath sounds normal. No respiratory distress. She has no wheezes. She has no rales. She exhibits no tenderness.  Genitourinary: Uterus normal, cervix normal, right adnexa normal, left adnexa normal and vulva normal. Cervix exhibits no tenderness. Vulva exhibits no lesion. Thick  white and vaginal discharge found.    Recent Results (from the past 2160 hour(s))  WET PREP BY MOLECULAR PROBE     Status: None   Collection Time    12/17/13  5:52 PM      Result Value Ref Range   Candida species NEG  Negative   Trichomonas vaginosis NEG  Negative   Gardnerella vaginalis NEG  Negative  GC/CHLAMYDIA PROBE  AMP     Status: None   Collection Time    12/17/13  5:52 PM      Result Value Ref Range   CT Probe RNA NEGATIVE     GC Probe RNA NEGATIVE     Comment:                                                                                             **Normal Reference Range: Negative**                 Assay performed using the Gen-Probe APTIMA COMBO2 (R) Assay.           Acceptable specimen types for this assay include APTIMA Swabs (Unisex,     endocervical, urethral, or vaginal), first void urine, and ThinPrep     liquid based cytology samples.    Assessment/Plan: Vaginitis and vulvovaginitis Will empirically treat for candidal vaginitis with Diflucan.  Vaginal specimen sent for ancillary testing.  Encourage daily probiotic.  Avoid dyes or scented soaps to vaginal area.

## 2014-03-03 NOTE — Assessment & Plan Note (Signed)
Will empirically treat for candidal vaginitis with Diflucan.  Vaginal specimen sent for ancillary testing.  Encourage daily probiotic.  Avoid dyes or scented soaps to vaginal area.

## 2014-03-12 ENCOUNTER — Telehealth: Payer: Self-pay | Admitting: *Deleted

## 2014-03-12 NOTE — Telephone Encounter (Signed)
Pt left message that she was returning our call re: lab results. Spoke with pt and advised her that all results are final and negative and to return for re-evaluation if symptoms persist. Pt voices understanding.

## 2014-04-02 ENCOUNTER — Ambulatory Visit: Payer: BC Managed Care – PPO | Admitting: Family

## 2014-04-02 DIAGNOSIS — Z0289 Encounter for other administrative examinations: Secondary | ICD-10-CM

## 2014-04-08 ENCOUNTER — Ambulatory Visit: Payer: BC Managed Care – PPO | Admitting: Family

## 2014-04-08 ENCOUNTER — Encounter: Payer: Self-pay | Admitting: Family

## 2014-04-08 ENCOUNTER — Telehealth: Payer: Self-pay | Admitting: Family

## 2014-04-08 DIAGNOSIS — Z0289 Encounter for other administrative examinations: Secondary | ICD-10-CM

## 2014-04-08 NOTE — Telephone Encounter (Signed)
Left message for patient to return my call and letter sent

## 2014-04-08 NOTE — Telephone Encounter (Signed)
Please contact pt and advise her to reschedule.

## 2014-04-08 NOTE — Telephone Encounter (Signed)
Patient was schedule for last week and no showed rescheduled to today because she thinks she has a hernia no showed

## 2014-05-08 ENCOUNTER — Other Ambulatory Visit: Payer: Self-pay | Admitting: Neurosurgery

## 2014-05-08 DIAGNOSIS — M5126 Other intervertebral disc displacement, lumbar region: Secondary | ICD-10-CM

## 2014-05-09 ENCOUNTER — Ambulatory Visit
Admission: RE | Admit: 2014-05-09 | Discharge: 2014-05-09 | Disposition: A | Discharge status: Home / Self Care | Payer: BC Managed Care – PPO | Source: Ambulatory Visit | Attending: Neurosurgery | Admitting: Neurosurgery

## 2014-05-09 VITALS — BP 119/71 | HR 69

## 2014-05-09 DIAGNOSIS — M5126 Other intervertebral disc displacement, lumbar region: Secondary | ICD-10-CM

## 2014-05-09 MED ORDER — METHYLPREDNISOLONE ACETATE 40 MG/ML INJ SUSP (RADIOLOG
120.0000 mg | Freq: Once | INTRAMUSCULAR | Status: AC
  Administered 2014-05-09: 120 mg via EPIDURAL

## 2014-05-09 MED ORDER — IOHEXOL 180 MG/ML  SOLN
1.0000 mL | Freq: Once | INTRAMUSCULAR | Status: AC | PRN
  Administered 2014-05-09: 1 mL via EPIDURAL

## 2014-05-09 NOTE — Discharge Instructions (Signed)

## 2014-05-28 ENCOUNTER — Other Ambulatory Visit: Payer: Self-pay | Admitting: Family

## 2014-05-28 ENCOUNTER — Telehealth: Payer: Self-pay | Admitting: Family

## 2014-05-28 MED ORDER — VENLAFAXINE HCL ER 37.5 MG PO CP24: 37.5000 mg | ORAL_CAPSULE | Freq: Every day | ORAL | Status: DC

## 2014-05-28 MED ORDER — VENLAFAXINE HCL ER 150 MG PO CP24: 150.0000 mg | ORAL_CAPSULE | Freq: Every day | ORAL | Status: DC

## 2014-05-28 NOTE — Telephone Encounter (Signed)
Requesting refill on venlafaxine XR [facility] in [place]

## 2014-05-28 NOTE — Telephone Encounter (Signed)
Refill request for Effexor 150 & 37.5 mg Last filled by MD on - 03.20.15, #30x5 Last AEX - 03.20.15 [Acute only - 06.25.15] Next AEX - 6 Mths [appt scheduled for 10.05.15] Please Advise on refills/SLS

## 2014-06-10 ENCOUNTER — Encounter: Payer: Self-pay | Admitting: Family

## 2014-06-10 ENCOUNTER — Ambulatory Visit (INDEPENDENT_AMBULATORY_CARE_PROVIDER_SITE_OTHER): Payer: BC Managed Care – PPO | Admitting: Family

## 2014-06-10 VITALS — BP 115/67 | HR 78 | Temp 99.1°F | Resp 16 | Ht 64.5 in | Wt 207.0 lb

## 2014-06-10 DIAGNOSIS — M62830 Muscle spasm of back: Secondary | ICD-10-CM

## 2014-06-10 DIAGNOSIS — F411 Generalized anxiety disorder: Secondary | ICD-10-CM

## 2014-06-10 DIAGNOSIS — Z23 Encounter for immunization: Secondary | ICD-10-CM

## 2014-06-10 DIAGNOSIS — E669 Obesity, unspecified: Secondary | ICD-10-CM

## 2014-06-10 MED ORDER — VENLAFAXINE HCL ER 150 MG PO CP24: ORAL_CAPSULE | ORAL | Status: DC

## 2014-06-10 MED ORDER — METAXALONE 800 MG PO TABS: 800.0000 mg | ORAL_TABLET | Freq: Every day | ORAL | Status: DC | PRN

## 2014-06-10 MED ORDER — VENLAFAXINE HCL ER 37.5 MG PO CP24: 37.5000 mg | ORAL_CAPSULE | Freq: Every day | ORAL | Status: DC

## 2014-06-10 NOTE — Progress Notes (Signed)
Pre visit review using our clinic review tool, if applicable. No additional management support is needed unless otherwise documented below in the visit note. 

## 2014-06-10 NOTE — Assessment & Plan Note (Signed)
Stable on effexor, continue same. 

## 2014-06-10 NOTE — Patient Instructions (Signed)
Continue your good work with exercise. Try to limit your calories to 1200 a day to help you with weight loss. Follow up in 6 months, sooner if problems/concerns.

## 2014-06-10 NOTE — Progress Notes (Signed)
Subjective:    Patient ID: Gina James, female    DOB: December 16, 1980, 33 y.o.   MRN: [id number]  HPI  Gina James is a 33 yr old female who presents today for follow up.  1) anxiety-  Maintained on effexor. Reports that anxiety is well controlled.  2) obesity-  She is meeting with a Physiological scientist. Has tried different work outs and adjusting diet but not losing. She reports that she works out 2 x a day. She reports that she weight lifts 2 x a week.  Spinning 3 x a week, boot camp 2x a week.  Kick boxing 2 x a week.    24 hr diet recall:  Breakfast- single serve organic oatmeal blueberries, walnuts and a tablespoon whole milk  Am coffee, green tea  Lunch- chicken fajita no carbs with cheese Water throughout the day  Protein bar for snack- some days, not yesterday  Dinner- made thin crust whole wheat with cheese, chicken, spinach, feta, olive  No pm snack.    Wt Readings from Last 3 Encounters:  06/10/14 207 lb (93.895 kg)  02/28/14 208 lb 8 oz (94.575 kg)  12/17/13 230 lb 1.3 oz (104.364 kg)   3) Intermittent low back pain- reports that she has used skelaxin sparingly and this helps her symptoms significantly.  Review of Systems See HPI  Past Medical History  Diagnosis Date  . Anxiety   . Post partum depression     History   Social History  . Marital Status: Married    Spouse Name: N/A    Number of Children: N/A  . Years of Education: N/A   Occupational History  . Not on file.   Social History Main Topics  . Smoking status: Former Smoker -- 8 years    Types: Cigarettes    Quit date: 09/06/2004  . Smokeless tobacco: Never Used  . Alcohol Use: Yes     Comment: once a month or less  . Drug Use: Not on file  . Sexual Activity: Not on file   Other Topics Concern  . Not on file   Social History Narrative   2 children- son 2007- Gina James,  Gina James 2009   Homemaker   Married   Completed tech school   Enjoys shopping, spending time with friends, reading.             Past Surgical History  Procedure Laterality Date  . Cholecystectomy  2007  . Other surgical history  1997    "laproscopy to check for endometriosis"  . Cesarean section  2007 and 2009    Family History  Problem Relation Age of Onset  . Diabetes Father   . Cancer Sister 30    breast, had chemo/double mastectomy  . Heart disease Maternal Grandfather   . Heart disease Paternal Grandfather     Allergies  Allergen Reactions  . Amoxicillin Hives  . Ceclor [Cefaclor] Hives  . Penicillins Hives  . Sulfa Antibiotics Hives  . Zithromax [Azithromycin] Hives    Current Outpatient Prescriptions on File Prior to Visit  Medication Sig Dispense Refill  . metaxalone (SKELAXIN) 800 MG tablet Take 1 tablet (800 mg total) by mouth daily as needed for muscle spasms.  30 tablet  0  . pyridOXINE (VITAMIN B-6) 100 MG tablet Take 1,000 mg by mouth daily.      Marland Kitchen venlafaxine XR (EFFEXOR-XR) 150 MG 24 hr capsule TAKE ONE CAPSULE BY MOUTH ONCE DAILY  30 capsule  0  . venlafaxine  XR (EFFEXOR-XR) 37.5 MG 24 hr capsule Take 1 capsule (37.5 mg total) by mouth daily.  30 capsule  0   No current facility-administered medications on file prior to visit.    BP 115/67  Pulse 78  Temp(Src) 99.1 F (37.3 C) (Oral)  Resp 16  Ht 5' 4.5" (1.638 m)  Wt 207 lb (93.895 kg)  BMI 35.00 kg/m2  SpO2 100%  LMP 05/23/2014       Objective:   Physical Exam  Constitutional: She appears well-developed and well-nourished. No distress.  Cardiovascular: Normal rate and regular rhythm.   No murmur heard. Pulmonary/Chest: Effort normal and breath sounds normal. No respiratory distress. She has no wheezes. She has no rales. She exhibits no tenderness.  Neurological: She is alert.  Skin: Skin is warm and dry.  Psychiatric: She has a normal mood and affect. Her behavior is normal. Judgment and thought content normal.          Assessment & Plan:

## 2014-06-10 NOTE — Assessment & Plan Note (Addendum)
I commended her for her exercise efforts.  We discussed bringing her calories down from 1500 a day to 1200 a day.  Continue exercise.  15 min spent with pt today.  >50% of this time was spent counseling pt on diet/exercise/weight loss.

## 2014-08-06 ENCOUNTER — Ambulatory Visit (INDEPENDENT_AMBULATORY_CARE_PROVIDER_SITE_OTHER): Payer: BC Managed Care – PPO | Admitting: Family

## 2014-08-06 ENCOUNTER — Other Ambulatory Visit (HOSPITAL_COMMUNITY)
Admission: RE | Admit: 2014-08-06 | Discharge: 2014-08-06 | Disposition: A | Discharge status: Home / Self Care | Payer: BC Managed Care – PPO | Source: Ambulatory Visit | Attending: Family | Admitting: Family

## 2014-08-06 ENCOUNTER — Encounter: Payer: Self-pay | Admitting: Family

## 2014-08-06 VITALS — BP 120/80 | HR 76 | Temp 98.6°F | Resp 16 | Ht 64.5 in | Wt 200.0 lb

## 2014-08-06 DIAGNOSIS — N760 Acute vaginitis: Secondary | ICD-10-CM | POA: Insufficient documentation

## 2014-08-06 DIAGNOSIS — Z113 Encounter for screening for infections with a predominantly sexual mode of transmission: Secondary | ICD-10-CM | POA: Insufficient documentation

## 2014-08-06 MED ORDER — FLUCONAZOLE 150 MG PO TABS: ORAL_TABLET | ORAL | Status: DC

## 2014-08-06 NOTE — Progress Notes (Signed)
Subjective:    Patient ID: Gina James, female    DOB: 03/07/1981, 33 y.o.   MRN: [id number]  HPI   [name] is a 33 yr old female who presents with chief complaint of vaginal itching.  Has been present x 1 week. Vaginal itching- internal.  Pelvic aching.  White thick discharge.  No new partners.  Denies fever, dysuria, frequency.   Review of Systems See HPI  Past Medical History  Diagnosis Date  . Anxiety   . Post partum depression     History   Social History  . Marital Status: Married    Spouse Name: N/A    Number of Children: N/A  . Years of Education: N/A   Occupational History  . Not on file.   Social History Main Topics  . Smoking status: Former Smoker -- 8 years    Types: Cigarettes    Quit date: 09/06/2004  . Smokeless tobacco: Never Used  . Alcohol Use: Yes     Comment: once a month or less  . Drug Use: Not on file  . Sexual Activity: Not on file   Other Topics Concern  . Not on file   Social History Narrative   2 children- son 2007- Sean,  Taryn 2009   Homemaker   Married   Completed tech school   Enjoys shopping, spending time with friends, reading.           Past Surgical History  Procedure Laterality Date  . Cholecystectomy  2007  . Other surgical history  1997    "laproscopy to check for endometriosis"  . Cesarean section  2007 and 2009    Family History  Problem Relation Age of Onset  . Diabetes Father   . Cancer Sister 30    breast, had chemo/double mastectomy  . Heart disease Maternal Grandfather   . Heart disease Paternal Grandfather     Allergies  Allergen Reactions  . Amoxicillin Hives  . Ceclor [Cefaclor] Hives  . Penicillins Hives  . Sulfa Antibiotics Hives  . Zithromax [Azithromycin] Hives    Current Outpatient Prescriptions on File Prior to Visit  Medication Sig Dispense Refill  . metaxalone (SKELAXIN) 800 MG tablet Take 1 tablet (800 mg total) by mouth daily as needed for muscle spasms. 30 tablet 0  .  Multiple Vitamins-Minerals (MULTIVITAMIN WITH MINERALS) tablet Take 1 tablet by mouth daily.    . Omega-3 Fatty Acids (FISH OIL) 1000 MG CAPS Take 1,000 mg by mouth daily.    Marland Kitchen pyridOXINE (VITAMIN B-6) 100 MG tablet Take 1,000 mg by mouth daily.    Marland Kitchen venlafaxine XR (EFFEXOR-XR) 150 MG 24 hr capsule TAKE ONE CAPSULE BY MOUTH ONCE DAILY 30 capsule 5  . venlafaxine XR (EFFEXOR-XR) 37.5 MG 24 hr capsule Take 1 capsule (37.5 mg total) by mouth daily. 30 capsule 5   No current facility-administered medications on file prior to visit.    BP 120/80 mmHg  Pulse 76  Temp(Src) 98.6 F (37 C) (Oral)  Resp 16  Ht 5' 4.5" (1.638 m)  Wt 200 lb (90.719 kg)  BMI 33.81 kg/m2  SpO2 99%  LMP 07/14/2014       Objective:   Physical Exam  Constitutional: She is oriented to person, place, and time. She appears well-developed and well-nourished. No distress.  HENT:  Head: Normocephalic and atraumatic.  Cardiovascular: Normal rate and regular rhythm.   No murmur heard. Pulmonary/Chest: Effort normal and breath sounds normal. No respiratory distress. She has  no wheezes. She has no rales. She exhibits no tenderness.  Genitourinary:  Copious thick white vaginal discharge is noted  Neurological: She is alert and oriented to person, place, and time.  Psychiatric: She has a normal mood and affect. Her behavior is normal. Judgment and thought content normal.          Assessment & Plan:

## 2014-08-06 NOTE — Patient Instructions (Signed)
Start diflucan. Call if symptoms worsen or if symptoms do not improve.

## 2014-08-06 NOTE — Progress Notes (Signed)
Pre visit review using our clinic review tool, if applicable. No additional management support is needed unless otherwise documented below in the visit note. 

## 2014-08-07 ENCOUNTER — Telehealth: Payer: Self-pay | Admitting: Family

## 2014-08-07 LAB — CERVICOVAGINAL ANCILLARY ONLY
CHLAMYDIA, DNA PROBE: NEGATIVE
NEISSERIA GONORRHEA: NEGATIVE
WET PREP (BD AFFIRM): NEGATIVE
Wet Prep (BD Affirm): NEGATIVE
Wet Prep (BD Affirm): POSITIVE — AB

## 2014-08-07 MED ORDER — METRONIDAZOLE 500 MG PO TABS: 500.0000 mg | ORAL_TABLET | Freq: Two times a day (BID) | ORAL | Status: DC

## 2014-08-07 NOTE — Telephone Encounter (Signed)
Left detailed message on home# and to call if any questions. 

## 2014-08-07 NOTE — Telephone Encounter (Signed)
Correction to below documentation. Message was left on cell#.

## 2014-08-07 NOTE — Telephone Encounter (Signed)
Please let pt know that wet prep shows bv. Neg for yeast.  Start metronidazole, no ETOH while taking. Stop diflucan.

## 2014-08-08 ENCOUNTER — Encounter: Payer: Self-pay | Admitting: Family

## 2014-08-11 DIAGNOSIS — N760 Acute vaginitis: Secondary | ICD-10-CM | POA: Insufficient documentation

## 2014-08-11 NOTE — Assessment & Plan Note (Signed)
rx provided for diflucan at time of visit based on exam, however wet prep neg for yeast and + fo BV. Rx provided for metronidazole.

## 2014-10-14 ENCOUNTER — Other Ambulatory Visit: Payer: Self-pay | Admitting: Neurosurgery

## 2014-10-14 DIAGNOSIS — M5136 Other intervertebral disc degeneration, lumbar region: Secondary | ICD-10-CM

## 2014-10-15 ENCOUNTER — Ambulatory Visit
Admission: RE | Admit: 2014-10-15 | Discharge: 2014-10-15 | Disposition: A | Discharge status: Home / Self Care | Payer: BLUE CROSS/BLUE SHIELD | Source: Ambulatory Visit | Attending: Neurosurgery | Admitting: Neurosurgery

## 2014-10-15 DIAGNOSIS — M5136 Other intervertebral disc degeneration, lumbar region: Secondary | ICD-10-CM

## 2014-10-15 MED ORDER — IOHEXOL 180 MG/ML  SOLN
1.0000 mL | Freq: Once | INTRAMUSCULAR | Status: AC | PRN
  Administered 2014-10-15: 1 mL via EPIDURAL

## 2014-10-15 MED ORDER — METHYLPREDNISOLONE ACETATE 40 MG/ML INJ SUSP (RADIOLOG
120.0000 mg | Freq: Once | INTRAMUSCULAR | Status: AC
  Administered 2014-10-15: 120 mg via EPIDURAL

## 2014-10-18 ENCOUNTER — Other Ambulatory Visit: Payer: Self-pay | Admitting: Family

## 2014-10-18 NOTE — Telephone Encounter (Signed)
Caller name: Ashlie Relation to pt: self  Call back number: [phone] Pharmacy: [facility] [address]  Reason for call:   Requesting refill of metaxalone [facility]

## 2014-10-18 NOTE — Telephone Encounter (Signed)
Last filled:  06/10/14 Amt: 30, 0  Last OV:  06/10/14 for muscle spasm  Please advise.

## 2014-10-18 NOTE — Telephone Encounter (Signed)
Needs OV first please.

## 2014-10-18 NOTE — Telephone Encounter (Signed)
Called and spoke with the pt and informed her of Melissa note below.  Pt understood and agreed, and scheduled an appt.//AB/CMA

## 2014-10-22 ENCOUNTER — Encounter: Payer: Self-pay | Admitting: Family

## 2014-10-22 ENCOUNTER — Ambulatory Visit (INDEPENDENT_AMBULATORY_CARE_PROVIDER_SITE_OTHER): Payer: BLUE CROSS/BLUE SHIELD | Admitting: Family

## 2014-10-22 VITALS — BP 90/60 | HR 72 | Temp 99.2°F | Resp 16 | Ht 64.5 in | Wt 201.4 lb

## 2014-10-22 DIAGNOSIS — M62830 Muscle spasm of back: Secondary | ICD-10-CM

## 2014-10-22 DIAGNOSIS — M545 Low back pain: Secondary | ICD-10-CM

## 2014-10-22 DIAGNOSIS — M79604 Pain in right leg: Secondary | ICD-10-CM

## 2014-10-22 DIAGNOSIS — E669 Obesity, unspecified: Secondary | ICD-10-CM

## 2014-10-22 DIAGNOSIS — F411 Generalized anxiety disorder: Secondary | ICD-10-CM

## 2014-10-22 DIAGNOSIS — M79605 Pain in left leg: Secondary | ICD-10-CM

## 2014-10-22 MED ORDER — METAXALONE 800 MG PO TABS: 800.0000 mg | ORAL_TABLET | Freq: Every day | ORAL | Status: DC | PRN

## 2014-10-22 NOTE — Assessment & Plan Note (Signed)
Improved s/p ESI, refill skelaxin for PRN use.  Avoid exercises that worsen your low back pain (such as kick boxing, pilates, heavy weight lifting etc.)

## 2014-10-22 NOTE — Progress Notes (Signed)
Pre visit review using our clinic review tool, if applicable. No additional management support is needed unless otherwise documented below in the visit note. 

## 2014-10-22 NOTE — Progress Notes (Signed)
Subjective:    Patient ID: Gina James, female    DOB: Aug 05, 1981, 34 y.o.   MRN: [id number]  HPI  Patient here for follow up:  1) Anxiety- she is currently maintained on effexor xr.  Reports that anxiety is well controlled on the effexor xr 150 + 37.5 once daily. Denies depression symptoms.   2) Obesity-  Weight is down 6 pounds since October. Working out regularly.    3) Low back pain/spams- had right L5-S1 ESI on 2/9.  She reports improvement in her pain from [facility]. She reports that she continues to have some spasms in her lower back (more in the center of her lower back).  Reports that prior to her shot she was doing pilates and this really hurt her back.  She lifts weights and ha dropped down in the weights.    Past Medical History  Diagnosis Date  . Anxiety   . Post partum depression     Review of Systems See HPI  Past Medical History  Diagnosis Date  . Anxiety   . Post partum depression     History   Social History  . Marital Status: Married    Spouse Name: N/A  . Number of Children: N/A  . Years of Education: N/A   Occupational History  . Not on file.   Social History Main Topics  . Smoking status: Former Smoker -- 8 years    Types: Cigarettes    Quit date: 09/06/2004  . Smokeless tobacco: Never Used  . Alcohol Use: Yes     Comment: once a month or less  . Drug Use: Not on file  . Sexual Activity: Not on file   Other Topics Concern  . Not on file   Social History Narrative   2 children- son 2007- Sean,  Taryn 2009   Homemaker   Married   Completed tech school   Enjoys shopping, spending time with friends, reading.           Past Surgical History  Procedure Laterality Date  . Cholecystectomy  2007  . Other surgical history  1997    "laproscopy to check for endometriosis"  . Cesarean section  2007 and 2009    Family History  Problem Relation Age of Onset  . Diabetes Father   . Cancer Sister 30    breast, had chemo/double mastectomy    . Heart disease Maternal Grandfather   . Heart disease Paternal Grandfather     Allergies  Allergen Reactions  . Amoxicillin Hives  . Ceclor [Cefaclor] Hives  . Penicillins Hives  . Sulfa Antibiotics Hives  . Zithromax [Azithromycin] Hives    Current Outpatient Prescriptions on File Prior to Visit  Medication Sig Dispense Refill  . Multiple Vitamins-Minerals (MULTIVITAMIN WITH MINERALS) tablet Take 1 tablet by mouth daily.    . Omega-3 Fatty Acids (FISH OIL) 1000 MG CAPS Take 1,000 mg by mouth daily.    Marland Kitchen pyridOXINE (VITAMIN B-6) 100 MG tablet Take 1,000 mg by mouth daily.    Marland Kitchen venlafaxine XR (EFFEXOR-XR) 150 MG 24 hr capsule TAKE ONE CAPSULE BY MOUTH ONCE DAILY 30 capsule 5  . venlafaxine XR (EFFEXOR-XR) 37.5 MG 24 hr capsule Take 1 capsule (37.5 mg total) by mouth daily. 30 capsule 5   No current facility-administered medications on file prior to visit.    BP 90/60 mmHg  Pulse 72  Temp(Src) 99.2 F (37.3 C) (Oral)  Resp 16  Ht 5' 4.5" (1.638 m)  Wt  201 lb 6.4 oz (91.354 kg)  BMI 34.05 kg/m2  SpO2 99%  LMP 10/03/2014       Objective:    Physical Exam  Constitutional: She appears well-developed and well-nourished.  Cardiovascular: Normal rate, regular rhythm and normal heart sounds.   No murmur heard. Pulmonary/Chest: Effort normal and breath sounds normal. No respiratory distress. She has no wheezes.  Musculoskeletal:       Cervical back: She exhibits no tenderness.       Thoracic back: She exhibits no tenderness.       Lumbar back: She exhibits no tenderness.  Neurological:  Reflex Scores:      Patellar reflexes are 2+ on the right side and 2+ on the left side. Bilateral lower extrem strength is 5/5  Psychiatric: She has a normal mood and affect. Her behavior is normal. Judgment and thought content normal.    BP 90/60 mmHg  Pulse 72  Temp(Src) 99.2 F (37.3 C) (Oral)  Resp 16  Ht 5' 4.5" (1.638 m)  Wt 201 lb 6.4 oz (91.354 kg)  BMI 34.05 kg/m2  SpO2  99%  LMP 10/03/2014 Wt Readings from Last 3 Encounters:  10/22/14 201 lb 6.4 oz (91.354 kg)  08/06/14 200 lb (90.719 kg)  06/10/14 207 lb (93.895 kg)     Dg Epidurography  10/15/2014   CLINICAL DATA:  Lumbosacral spondylosis without myelopathy. Good relief for several months with pain recurring now on the RIGHT.  EXAM: LUMBAR INTERLAMINAR EPIDURAL INJECTION  FLUOROSCOPY TIME:  13 seconds corresponding to a dose of 0.07 Gy cm squared  PROCEDURE: Procedure: Informed consent was obtained on the first visit. Time-out procedure was performed. An interlaminar approach was performed at L5-S1 on the RIGHT. 20 gauge needle was advanced using loss-of-resistance technique.  DIAGNOSTIC/THERAPUETIC EPIDURAL INJECTION: Injection of Omnipaque 180 shows a good epidural pattern with spread above and below the level of needle placement, primarily on the side of needle placement. No vascular or subarachnoid opacification was seen. 120 mg of Depo-Medrol mixed with 2 cc of 1% Lidocaine were instilled. The procedure was well-tolerated, and the patient was discharged thirty minutes following the injection in good condition.  IMPRESSION: Technically successful third lumbar interlaminar epidural injection at L5-S1, RIGHT.   Electronically Signed   By: Rolla Flatten M.D.   On: 10/15/2014 07:56       Assessment & Plan:   Problem List Items Addressed This Visit    None    Visit Diagnoses    Muscle spasm of back    -  Primary        O'SULLIVAN,Tenzin Pavon S., NP

## 2014-10-22 NOTE — Assessment & Plan Note (Signed)
Commended pt on her weight loss efforts.  Continue.

## 2014-10-22 NOTE — Assessment & Plan Note (Signed)
Stable on effexor, continue same. 

## 2014-10-22 NOTE — Patient Instructions (Addendum)
Avoid exercises that worsen your low back pain (such as kick boxing, pilates, heavy weight lifting etc.) Continue current dose of effexor.  Please schedule a complete physical at the front desk.

## 2014-10-28 ENCOUNTER — Ambulatory Visit (INDEPENDENT_AMBULATORY_CARE_PROVIDER_SITE_OTHER): Payer: BLUE CROSS/BLUE SHIELD | Admitting: Family

## 2014-10-28 ENCOUNTER — Encounter: Payer: Self-pay | Admitting: Family

## 2014-10-28 VITALS — BP 110/68 | HR 79 | Temp 98.9°F | Resp 16 | Ht 64.5 in | Wt 202.2 lb

## 2014-10-28 DIAGNOSIS — Z23 Encounter for immunization: Secondary | ICD-10-CM

## 2014-10-28 DIAGNOSIS — K148 Other diseases of tongue: Secondary | ICD-10-CM

## 2014-10-28 DIAGNOSIS — Z0000 Encounter for general adult medical examination without abnormal findings: Secondary | ICD-10-CM

## 2014-10-28 DIAGNOSIS — Z8632 Personal history of gestational diabetes: Secondary | ICD-10-CM

## 2014-10-28 LAB — CBC WITH DIFFERENTIAL/PLATELET
BASOS ABS: 0 10*3/uL (ref 0.0–0.1)
BASOS PCT: 0.6 % (ref 0.0–3.0)
Eosinophils Absolute: 0 10*3/uL (ref 0.0–0.7)
Eosinophils Relative: 0.8 % (ref 0.0–5.0)
HCT: 42 % (ref 36.0–46.0)
Hemoglobin: 14.5 g/dL (ref 12.0–15.0)
LYMPHS PCT: 48.8 % — AB (ref 12.0–46.0)
Lymphs Abs: 2.5 10*3/uL (ref 0.7–4.0)
MCHC: 34.5 g/dL (ref 30.0–36.0)
MCV: 85.8 fl (ref 78.0–100.0)
Monocytes Absolute: 0.3 10*3/uL (ref 0.1–1.0)
Monocytes Relative: 6.6 % (ref 3.0–12.0)
NEUTROS PCT: 43.2 % (ref 43.0–77.0)
Neutro Abs: 2.3 10*3/uL (ref 1.4–7.7)
PLATELETS: 133 10*3/uL — AB (ref 150.0–400.0)
RBC: 4.9 Mil/uL (ref 3.87–5.11)
RDW: 13.8 % (ref 11.5–15.5)
WBC: 5.2 10*3/uL (ref 4.0–10.5)

## 2014-10-28 LAB — TSH: TSH: 1.55 u[IU]/mL (ref 0.35–4.50)

## 2014-10-28 LAB — HEMOGLOBIN A1C: HEMOGLOBIN A1C: 5.1 % (ref 4.6–6.5)

## 2014-10-28 NOTE — Progress Notes (Signed)
Subjective:    Patient ID: Gina James, female    DOB: [birth year], 34 y.o.   MRN: [id number]  HPI  Patient presents today for complete physical.  Immunizations: due for Td today Diet:healthy Exercise: 7 hours a week reports that she was eating 1000-1200 calories a day.  Was not losing weight, eating up to 2000 a day Pap Smear:  2014- normal per pt Mammogram: 2014- normal.    Review of Systems  Constitutional:       Wt Readings from Last 3 Encounters: 10/28/14 : 202 lb 3.2 oz (91.717 kg) 10/22/14 : 201 lb 6.4 oz (91.354 kg) 08/06/14 : 200 lb (90.719 kg)   HENT: Negative for hearing loss and rhinorrhea.   Eyes: Negative for visual disturbance.       Some blurred vision, would like her contact prescription changed.   Respiratory: Negative for cough and shortness of breath.   Cardiovascular: Negative for chest pain and leg swelling.  Gastrointestinal: Negative for nausea, vomiting and diarrhea.  Genitourinary: Negative for dysuria, frequency and menstrual problem.  Musculoskeletal: Positive for back pain.       Some right knee pain- old injury Back pain is better following ESI earlier this month. Felt dizzy/nauseated on skelaxin, d/c'd using aleve as needed  Skin: Negative for rash.  Neurological: Positive for headaches.       Daily headaches, some are severe, uses aleve bid for months  Psychiatric/Behavioral:       Denies depression/anxiety, frustrated about her back, improved on effexor   Past Medical History  Diagnosis Date  . Anxiety   . Post partum depression     History   Social History  . Marital Status: Married    Spouse Name: N/A  . Number of Children: N/A  . Years of Education: N/A   Occupational History  . Not on file.   Social History Main Topics  . Smoking status: Former Smoker -- 8 years    Types: Cigarettes    Quit date: 09/06/2004  . Smokeless tobacco: Never Used  . Alcohol Use: Yes     Comment: once a month or less  . Drug Use: Not on  file  . Sexual Activity: Not on file   Other Topics Concern  . Not on file   Social History Narrative   2 children- son 2007- Sean,  Taryn 2009   Homemaker   Married   Completed tech school   Enjoys shopping, spending time with friends, reading.           Past Surgical History  Procedure Laterality Date  . Cholecystectomy  2007  . Other surgical history  1997    "laproscopy to check for endometriosis"  . Cesarean section  2007 and 2009    Family History  Problem Relation Age of Onset  . Diabetes Father   . Cancer Sister 30    breast, had chemo/double mastectomy  . Heart disease Maternal Grandfather   . Heart disease Paternal Grandfather     Allergies  Allergen Reactions  . Amoxicillin Hives  . Ceclor [Cefaclor] Hives  . Penicillins Hives  . Sulfa Antibiotics Hives  . Zithromax [Azithromycin] Hives    Current Outpatient Prescriptions on File Prior to Visit  Medication Sig Dispense Refill  . metaxalone (SKELAXIN) 800 MG tablet Take 1 tablet (800 mg total) by mouth daily as needed for muscle spasms. 30 tablet 0  . Multiple Vitamins-Minerals (MULTIVITAMIN WITH MINERALS) tablet Take 1 tablet by mouth daily.    Marland Kitchen  Omega-3 Fatty Acids (FISH OIL) 1000 MG CAPS Take 1,000 mg by mouth daily.    Marland Kitchen pyridOXINE (VITAMIN B-6) 100 MG tablet Take 1,000 mg by mouth daily.    Marland Kitchen venlafaxine XR (EFFEXOR-XR) 150 MG 24 hr capsule TAKE ONE CAPSULE BY MOUTH ONCE DAILY 30 capsule 5  . venlafaxine XR (EFFEXOR-XR) 37.5 MG 24 hr capsule Take 1 capsule (37.5 mg total) by mouth daily. 30 capsule 5   No current facility-administered medications on file prior to visit.    BP 84/56 mmHg  Pulse 79  Temp(Src) 98.9 F (37.2 C) (Oral)  Resp 16  Ht 5' 4.5" (1.638 m)  Wt 202 lb 3.2 oz (91.717 kg)  BMI 34.18 kg/m2  SpO2 99%  LMP 10/03/2014        Objective:   Physical Exam  Physical Exam  Constitutional: She is oriented to person, place, and time. She appears well-developed and  well-nourished. No distress.  HENT:  Head: Normocephalic and atraumatic.  Right Ear: Tympanic membrane and ear canal normal.  Left Ear: Tympanic membrane and ear canal normal.  Mouth/Throat: Oropharynx is clear and moist.  Eyes: Pupils are equal, round, and reactive to light. No scleral icterus.  Neck: Normal range of motion. No thyromegaly present.  Cardiovascular: Normal rate and regular rhythm.   No murmur heard. Pulmonary/Chest: Effort normal and breath sounds normal. No respiratory distress. He has no wheezes. She has no rales. She exhibits no tenderness.  Abdominal: Soft. Bowel sounds are normal. He exhibits no distension and no mass. There is no tenderness. There is no rebound and no guarding.  Musculoskeletal: She exhibits no edema.  Lymphadenopathy:    She has no cervical adenopathy.  Neurological: She is alert and oriented to person, place, and time. She has normal patellar reflexes. She exhibits normal muscle tone. Coordination normal.  Skin: Skin is warm and dry. suntanned, multiple tattoos, discolored mole note right hip.  Psychiatric: She has a normal mood and affect. Her behavior is normal. Judgment and thought content normal.  Breasts: Examined lying Right: Without masses, retractions, discharge or axillary adenopathy.  Left: Without masses, retractions, discharge or axillary adenopathy.      Assessment & Plan:         Assessment & Plan:

## 2014-10-28 NOTE — Progress Notes (Signed)
Pre visit review using our clinic review tool, if applicable. No additional management support is needed unless otherwise documented below in the visit note. 

## 2014-10-28 NOTE — Patient Instructions (Signed)
Please complete lab work prior to leaving You will be contacted about your referral to ENT. Stop tanning!  This can lead to skin cancer and premature aging. Schedule mole removal appointment (75min) at the front desk.

## 2014-10-29 DIAGNOSIS — Z0000 Encounter for general adult medical examination without abnormal findings: Secondary | ICD-10-CM | POA: Insufficient documentation

## 2014-10-29 HISTORY — DX: Encounter for general adult medical examination without abnormal findings: Z00.00

## 2014-10-29 LAB — BASIC METABOLIC PANEL
BUN: 16 mg/dL (ref 6–23)
CO2: 25 meq/L (ref 19–32)
CREATININE: 0.83 mg/dL (ref 0.40–1.20)
Calcium: 9.2 mg/dL (ref 8.4–10.5)
Chloride: 106 mEq/L (ref 96–112)
GFR: 83.09 mL/min (ref >60.00)
Glucose, Bld: 65 mg/dL — ABNORMAL LOW (ref 70–99)
Potassium: 4.4 mEq/L (ref 3.5–5.1)
Sodium: 139 mEq/L (ref 135–145)

## 2014-10-29 LAB — HEPATIC FUNCTION PANEL
ALT: 85 U/L — AB (ref 0–35)
AST: 77 U/L — AB (ref 0–37)
Albumin: 3.9 g/dL (ref 3.5–5.2)
Alkaline Phosphatase: 110 U/L (ref 39–117)
BILIRUBIN TOTAL: 0.5 mg/dL (ref 0.2–1.2)
Bilirubin, Direct: 0.1 mg/dL (ref 0.0–0.3)
Total Protein: 7 g/dL (ref 6.0–8.3)

## 2014-10-29 LAB — LIPID PANEL
CHOL/HDL RATIO: 4
CHOLESTEROL: 147 mg/dL (ref 0–200)
HDL: 41.2 mg/dL (ref >39.00)
LDL Cholesterol: 87 mg/dL (ref 0–99)
NonHDL: 105.8
Triglycerides: 93 mg/dL (ref 0.0–149.0)
VLDL: 18.6 mg/dL (ref 0.0–40.0)

## 2014-10-29 NOTE — Assessment & Plan Note (Signed)
Continue regular excise, diet, tetanus booster today. Obtain routine lab work.

## 2014-10-31 ENCOUNTER — Telehealth: Payer: Self-pay | Admitting: Family

## 2014-10-31 DIAGNOSIS — R945 Abnormal results of liver function studies: Secondary | ICD-10-CM

## 2014-10-31 DIAGNOSIS — R7989 Other specified abnormal findings of blood chemistry: Secondary | ICD-10-CM

## 2014-10-31 NOTE — Telephone Encounter (Signed)
Please contact pt and let her know LFT's are elevated. I would recommend that she complete acute hep panel and abd [place] pended below.

## 2014-10-31 NOTE — Telephone Encounter (Signed)
Left detailed message on pt's cell#  re: below results and to call and schedule lab appt. Orders signed.

## 2014-11-01 ENCOUNTER — Other Ambulatory Visit (INDEPENDENT_AMBULATORY_CARE_PROVIDER_SITE_OTHER): Payer: BLUE CROSS/BLUE SHIELD

## 2014-11-01 DIAGNOSIS — R7989 Other specified abnormal findings of blood chemistry: Secondary | ICD-10-CM

## 2014-11-01 DIAGNOSIS — R945 Abnormal results of liver function studies: Secondary | ICD-10-CM

## 2014-11-02 LAB — HEPATITIS PANEL, ACUTE
HCV Ab: NEGATIVE
Hep A IgM: NONREACTIVE
Hep B C IgM: NONREACTIVE
Hepatitis B Surface Ag: NEGATIVE

## 2014-11-05 ENCOUNTER — Ambulatory Visit (HOSPITAL_BASED_OUTPATIENT_CLINIC_OR_DEPARTMENT_OTHER)
Admission: RE | Admit: 2014-11-05 | Discharge: 2014-11-05 | Disposition: A | Discharge status: Home / Self Care | Payer: BLUE CROSS/BLUE SHIELD | Source: Ambulatory Visit | Attending: Family | Admitting: Family

## 2014-11-05 DIAGNOSIS — R7989 Other specified abnormal findings of blood chemistry: Secondary | ICD-10-CM | POA: Insufficient documentation

## 2014-11-05 DIAGNOSIS — Z9049 Acquired absence of other specified parts of digestive tract: Secondary | ICD-10-CM | POA: Diagnosis not present

## 2014-11-11 ENCOUNTER — Telehealth: Payer: Self-pay | Admitting: *Deleted

## 2014-11-11 DIAGNOSIS — R748 Abnormal levels of other serum enzymes: Secondary | ICD-10-CM

## 2014-11-11 NOTE — Telephone Encounter (Signed)
I would recommend that she stop all supplements except for multivitamin for now until we repeat her lab work.

## 2014-11-11 NOTE — Telephone Encounter (Signed)
Notified pt and she voices understanding. 

## 2014-11-11 NOTE — Telephone Encounter (Signed)
-----   Message from Debbrah Alar, NP sent at 11/08/2014  7:43 AM EST ----- Abdominal US is normal. Hepatitis screen negative.  Is she taking any herbal supplements.  How often does she drink ETOH.  I would avoid ETOH. Follow up LFT in 1 month, dx elevated lft.

## 2014-11-11 NOTE — Telephone Encounter (Signed)
Notified pt. She states she currently takes Olive Leaf capsule one daily, AZO yeast + one daily, Chaspeberry only around menstrual cycle and does not drink alcohol every week. If she does usually is 2 beers or 1 glass of wine. Scheduled pt lab appt for 12/12/14 at 1:15pm.  Lab order entered. Please advise if further instructions?

## 2014-11-19 ENCOUNTER — Ambulatory Visit (INDEPENDENT_AMBULATORY_CARE_PROVIDER_SITE_OTHER): Payer: BLUE CROSS/BLUE SHIELD | Admitting: Family

## 2014-11-19 ENCOUNTER — Encounter: Payer: Self-pay | Admitting: Family

## 2014-11-19 ENCOUNTER — Other Ambulatory Visit (HOSPITAL_COMMUNITY)
Admission: RE | Admit: 2014-11-19 | Discharge: 2014-11-19 | Disposition: A | Discharge status: Home / Self Care | Payer: BLUE CROSS/BLUE SHIELD | Source: Ambulatory Visit | Attending: Family | Admitting: Family

## 2014-11-19 VITALS — BP 102/68 | HR 78 | Temp 99.3°F | Resp 18 | Ht 64.5 in | Wt 202.8 lb

## 2014-11-19 DIAGNOSIS — D2271 Melanocytic nevi of right lower limb, including hip: Secondary | ICD-10-CM | POA: Diagnosis not present

## 2014-11-19 DIAGNOSIS — L989 Disorder of the skin and subcutaneous tissue, unspecified: Secondary | ICD-10-CM

## 2014-11-19 DIAGNOSIS — D229 Melanocytic nevi, unspecified: Secondary | ICD-10-CM

## 2014-11-19 NOTE — Progress Notes (Signed)
Pre visit review using our clinic review tool, if applicable. No additional management support is needed unless otherwise documented below in the visit note. 

## 2014-11-19 NOTE — Progress Notes (Signed)
   Subjective:    Patient ID: Gina James, female    DOB: December 11, 1980, 34 y.o.   MRN: [id number]  HPI   Gina James is a 34 yr old female who presents today for mole removal.       Review of Systems     Objective:   Physical Exam  Constitutional: She is oriented to person, place, and time. She appears well-developed and well-nourished. No distress.  Neurological: She is alert and oriented to person, place, and time.  Skin:  approx .6cm hyperpigmented lesion with hypopigmented center right hip  Psychiatric: She has a normal mood and affect. Her behavior is normal. Thought content normal.          Assessment & Plan:

## 2014-11-19 NOTE — Patient Instructions (Signed)
Keep area clean and dry for 24 hrs. Call if redness/swelling. We will contact you with your results.

## 2014-11-19 NOTE — Assessment & Plan Note (Signed)
Procedure including risks/benefits explained to patient.  Questions were answered. After informed consent was obtained and a time out completed, site was cleansed with betadine and then alcohol. 1% Lidocaine with epinephrine was injected under lesion and then shave biopsy was performed. Area was cauterized to obtain hemostasis.  Pt tolerated procedure well.  Specimen sent for pathology review.  Pt instructed to keep the area dry for 24 hours and to contact us if he develops redness, drainage or swelling at the site.

## 2014-11-22 ENCOUNTER — Encounter: Payer: Self-pay | Admitting: Family

## 2014-12-06 HISTORY — PX: COMBINED ABDOMINOPLASTY AND LIPOSUCTION: SUR284

## 2014-12-10 ENCOUNTER — Ambulatory Visit: Payer: BLUE CROSS/BLUE SHIELD | Admitting: Family

## 2014-12-12 ENCOUNTER — Other Ambulatory Visit: Payer: BLUE CROSS/BLUE SHIELD

## 2014-12-17 ENCOUNTER — Encounter (HOSPITAL_BASED_OUTPATIENT_CLINIC_OR_DEPARTMENT_OTHER): Payer: Self-pay | Admitting: *Deleted

## 2014-12-17 ENCOUNTER — Emergency Department (HOSPITAL_BASED_OUTPATIENT_CLINIC_OR_DEPARTMENT_OTHER)
Admission: EM | Admit: 2014-12-17 | Discharge: 2014-12-17 | Disposition: A | Discharge status: Home / Self Care | Payer: BLUE CROSS/BLUE SHIELD | Attending: Emergency Medicine | Admitting: Emergency Medicine

## 2014-12-17 DIAGNOSIS — Z8659 Personal history of other mental and behavioral disorders: Secondary | ICD-10-CM | POA: Insufficient documentation

## 2014-12-17 DIAGNOSIS — Z792 Long term (current) use of antibiotics: Secondary | ICD-10-CM | POA: Diagnosis not present

## 2014-12-17 DIAGNOSIS — Z87891 Personal history of nicotine dependence: Secondary | ICD-10-CM | POA: Diagnosis not present

## 2014-12-17 DIAGNOSIS — Z79899 Other long term (current) drug therapy: Secondary | ICD-10-CM | POA: Diagnosis not present

## 2014-12-17 DIAGNOSIS — R339 Retention of urine, unspecified: Secondary | ICD-10-CM | POA: Diagnosis not present

## 2014-12-17 DIAGNOSIS — R338 Other retention of urine: Secondary | ICD-10-CM

## 2014-12-17 DIAGNOSIS — Z880 Allergy status to penicillin: Secondary | ICD-10-CM | POA: Insufficient documentation

## 2014-12-17 NOTE — ED Notes (Signed)
Pt d/c home with foley in place- has f/u appt with Dr Lenon Curt in am-referral given for urology

## 2014-12-17 NOTE — ED Provider Notes (Signed)
CSN: [id number]     Arrival date & time 12/17/14  2043 History  This chart was scribed for Orpah Greek, MD by Tula Nakayama, ED Scribe. This patient was seen in room MH10/MH10 and the patient's care was started at 9:58 PM.    Chief Complaint  Patient presents with  . Urinary Retention   The history is provided by the patient. No language interpreter was used.    HPI Comments: Gina James is a 34 y.o. female who presents to the Emergency Department complaining of moderate, gradually worsening urinary retention that started 2 hours ago. She states suprapubic abdominal pain as an associated symptom. Pt had liposuction of her bilateral thighs and a tummy tuck earlier today. She reports prior problems urinating after anesthesia. Pt has a follow-up appointment with her surgeon tomorrow. Her last void was 12 hours ago.  Past Medical History  Diagnosis Date  . Anxiety   . Post partum depression    Past Surgical History  Procedure Laterality Date  . Cholecystectomy  2007  . Other surgical history  1997    "laproscopy to check for endometriosis"  . Cesarean section  2007 and 2009  . Liposuction    . Tummy tuck     Family History  Problem Relation Age of Onset  . Diabetes Father   . Cancer Sister 30    breast, had chemo/double mastectomy  . Heart disease Maternal Grandfather   . Heart disease Paternal Grandfather    History  Substance Use Topics  . Smoking status: Former Smoker -- 8 years    Types: Cigarettes    Quit date: 09/06/2004  . Smokeless tobacco: Never Used  . Alcohol Use: Yes     Comment: once a month or less   OB History    No data available     Review of Systems  Gastrointestinal: Positive for abdominal pain.  Genitourinary: Positive for difficulty urinating.  All other systems reviewed and are negative.  Allergies  Phenergan; Amoxicillin; Ceclor; Penicillins; Sulfa antibiotics; and Zithromax  Home Medications   Prior to Admission medications    Medication Sig Start Date End Date Taking? Authorizing Provider  doxycycline (VIBRAMYCIN) 100 MG capsule Take 100 mg by mouth 2 (two) times daily.   Yes Historical Provider, MD  metaxalone (SKELAXIN) 800 MG tablet Take 1 tablet (800 mg total) by mouth daily as needed for muscle spasms. 10/22/14  Yes Debbrah Alar, NP  Multiple Vitamins-Minerals (MULTIVITAMIN WITH MINERALS) tablet Take 1 tablet by mouth daily.   Yes Historical Provider, MD  Omega-3 Fatty Acids (FISH OIL) 1000 MG CAPS Take 1,000 mg by mouth daily.   Yes Historical Provider, MD  oxyCODONE-acetaminophen (PERCOCET/ROXICET) 5-325 MG per tablet Take 2 tablets by mouth every 4 (four) hours as needed for severe pain.   Yes Historical Provider, MD  pyridOXINE (VITAMIN B-6) 100 MG tablet Take 1,000 mg by mouth daily.   Yes Historical Provider, MD  venlafaxine XR (EFFEXOR-XR) 150 MG 24 hr capsule TAKE ONE CAPSULE BY MOUTH ONCE DAILY 06/10/14  Yes Debbrah Alar, NP  venlafaxine XR (EFFEXOR-XR) 37.5 MG 24 hr capsule Take 1 capsule (37.5 mg total) by mouth daily. 06/10/14   Debbrah Alar, NP   BP 126/75 mmHg  Pulse 99  Temp(Src) 99 F (37.2 C) (Oral)  Resp 18  Ht 5\' 4"  (1.626 m)  Wt 198 lb (89.812 kg)  BMI 33.97 kg/m2  SpO2 99%  LMP 11/29/2014 Physical Exam  Constitutional: She is oriented to person, place,  and time. She appears well-developed and well-nourished. No distress.  HENT:  Head: Normocephalic and atraumatic.  Right Ear: Hearing normal.  Left Ear: Hearing normal.  Nose: Nose normal.  Mouth/Throat: Oropharynx is clear and moist and mucous membranes are normal.  Eyes: Conjunctivae and EOM are normal. Pupils are equal, round, and reactive to light.  Neck: Normal range of motion. Neck supple.  Cardiovascular: Regular rhythm, S1 normal and S2 normal.  Exam reveals no gallop and no friction rub.   No murmur heard. Pulmonary/Chest: Effort normal and breath sounds normal. No respiratory distress. She exhibits no  tenderness.  Abdominal: Soft. Normal appearance and bowel sounds are normal. There is no hepatosplenomegaly. There is tenderness. There is no rebound, no guarding, no tenderness at McBurney's point and negative Murphy's sign. No hernia.  Diffuse abdominal tenderness  Musculoskeletal: Normal range of motion.  Neurological: She is alert and oriented to person, place, and time. She has normal strength. No cranial nerve deficit or sensory deficit. Coordination normal. GCS eye subscore is 4. GCS verbal subscore is 5. GCS motor subscore is 6.  Skin: Skin is warm, dry and intact. No rash noted. No cyanosis.  Psychiatric: She has a normal mood and affect. Her speech is normal and behavior is normal. Thought content normal.  Nursing note and vitals reviewed.   ED Course  Procedures   DIAGNOSTIC STUDIES: Oxygen Saturation is 99% on RA, normal by my interpretation.    COORDINATION OF CARE: 10:16 PM Discussed treatment plan with pt which includes insertion of a foley catheter. Pt agreed to plan.   Labs Review Labs Reviewed - No data to display  Imaging Review No results found.   EKG Interpretation None      MDM   Final diagnoses:  None   acute urinary retention  Patient presents to the ER for evaluation of acute urinary retention after anesthesia. Patient had a cosmetic surgery performed earlier today. She has not been able to urinate since she left the surgery center. She has had similar urinary retention with anesthesia in the past. Patient was complaining of pelvic pain and pressure, sense of needing to urinate. A Foley catheter was placed and this discomfort completely resolved.  She has follow-up scheduled tomorrow with her surgeon. She will also be referred to urology to determine when it is appropriate to remove the Foley catheter.  I personally performed the services described in this documentation, which was scribed in my presence. The recorded information has been reviewed and  is accurate.     Orpah Greek, MD 12/17/14 [phone]

## 2014-12-17 NOTE — ED Notes (Signed)
Pt had tummy tuck and liposuction procedure today- has been unable to void since procedure- last urinary output was 1030- pt reports similar trouble after receiving anesthesia in the past

## 2014-12-17 NOTE — Discharge Instructions (Signed)
Acute Urinary Retention °Acute urinary retention is the temporary inability to urinate. This is an uncommon problem in women. It can be caused by: °· Infection. °· A side effect of a medicine. °· A problem in a nearby organ that presses or squeezes on the bladder or the urethra (the tube that drains the bladder). °· Psychological problems. °·  Surgery on your bladder, urethra, or pelvic organs that causes obstruction to the outflow of urine from your bladder. °HOME CARE INSTRUCTIONS  °If you are sent home with a Foley catheter and a drainage system, you will need to discuss the best course of action with your health care provider. While the catheter is in, maintain a good intake of fluids. Keep the drainage bag emptied and lower than your catheter. This is so that contaminated urine will not flow back into your bladder, which could lead to a urinary tract infection. °There are two main types of drainage bags. One is a large bag that usually is used at night. It has a good capacity that will allow you to sleep through the night without having to empty it. The second type is called a leg bag. It has a smaller capacity so it needs to be emptied more frequently. However, the main advantage is that it can be attached by a leg strap and goes underneath your clothing, allowing you the freedom to move about or leave your home. °Only take over-the-counter or prescription medicines for pain, discomfort, or fever as directed by your health care provider.  °SEEK MEDICAL CARE IF: °· You develop a low-grade fever. °· You experience spasms or leakage of urine with the spasms. °SEEK IMMEDIATE MEDICAL CARE IF:  °· You develop chills or fever. °· Your catheter stops draining urine. °· Your catheter falls out. °· You start to develop increased bleeding that does not respond to rest and increased fluid intake. °MAKE SURE YOU: °· Understand these instructions. °· Will watch your condition. °· Will get help right away if you are not  doing well or get worse. °Document Released: 08/22/2006 Document Revised: 06/13/2013 Document Reviewed: 02/01/2013 °Exit[employer]® Patient Information ©2015 [employer]. This information is not intended to replace advice given to you by your health care provider. Make sure you discuss any questions you have with your health care provider. ° °

## 2015-01-03 ENCOUNTER — Other Ambulatory Visit: Payer: Self-pay | Admitting: Family

## 2015-03-11 ENCOUNTER — Other Ambulatory Visit: Payer: Self-pay | Admitting: Neurosurgery

## 2015-03-11 DIAGNOSIS — M51369 Other intervertebral disc degeneration, lumbar region without mention of lumbar back pain or lower extremity pain: Secondary | ICD-10-CM

## 2015-03-11 DIAGNOSIS — M5136 Other intervertebral disc degeneration, lumbar region: Secondary | ICD-10-CM

## 2015-03-18 ENCOUNTER — Ambulatory Visit
Admission: RE | Admit: 2015-03-18 | Discharge: 2015-03-18 | Disposition: A | Discharge status: Home / Self Care | Payer: BLUE CROSS/BLUE SHIELD | Source: Ambulatory Visit | Attending: Neurosurgery | Admitting: Neurosurgery

## 2015-03-18 DIAGNOSIS — M5136 Other intervertebral disc degeneration, lumbar region: Secondary | ICD-10-CM

## 2015-03-18 MED ORDER — IOHEXOL 180 MG/ML  SOLN
1.0000 mL | Freq: Once | INTRAMUSCULAR | Status: AC | PRN
Start: 2015-03-18 — End: 2015-03-18

## 2015-03-18 MED ORDER — METHYLPREDNISOLONE ACETATE 40 MG/ML INJ SUSP (RADIOLOG: 120.0000 mg | Freq: Once | INTRAMUSCULAR | Status: DC

## 2015-04-06 ENCOUNTER — Emergency Department (HOSPITAL_COMMUNITY): Payer: BLUE CROSS/BLUE SHIELD

## 2015-04-06 ENCOUNTER — Telehealth: Payer: Self-pay | Admitting: Family

## 2015-04-06 ENCOUNTER — Emergency Department (HOSPITAL_COMMUNITY)
Admission: EM | Admit: 2015-04-06 | Discharge: 2015-04-06 | Disposition: A | Discharge status: Home / Self Care | Payer: BLUE CROSS/BLUE SHIELD | Attending: Emergency Medicine | Admitting: Emergency Medicine

## 2015-04-06 ENCOUNTER — Encounter (HOSPITAL_COMMUNITY): Payer: Self-pay | Admitting: Emergency Medicine

## 2015-04-06 DIAGNOSIS — Y9301 Activity, walking, marching and hiking: Secondary | ICD-10-CM | POA: Insufficient documentation

## 2015-04-06 DIAGNOSIS — W010XXA Fall on same level from slipping, tripping and stumbling without subsequent striking against object, initial encounter: Secondary | ICD-10-CM | POA: Insufficient documentation

## 2015-04-06 DIAGNOSIS — Y998 Other external cause status: Secondary | ICD-10-CM | POA: Diagnosis not present

## 2015-04-06 DIAGNOSIS — Y9289 Other specified places as the place of occurrence of the external cause: Secondary | ICD-10-CM | POA: Diagnosis not present

## 2015-04-06 DIAGNOSIS — S82242A Displaced spiral fracture of shaft of left tibia, initial encounter for closed fracture: Secondary | ICD-10-CM | POA: Insufficient documentation

## 2015-04-06 DIAGNOSIS — F329 Major depressive disorder, single episode, unspecified: Secondary | ICD-10-CM | POA: Insufficient documentation

## 2015-04-06 DIAGNOSIS — F419 Anxiety disorder, unspecified: Secondary | ICD-10-CM | POA: Diagnosis not present

## 2015-04-06 DIAGNOSIS — Z792 Long term (current) use of antibiotics: Secondary | ICD-10-CM | POA: Insufficient documentation

## 2015-04-06 DIAGNOSIS — Z79899 Other long term (current) drug therapy: Secondary | ICD-10-CM | POA: Insufficient documentation

## 2015-04-06 DIAGNOSIS — S82291A Other fracture of shaft of right tibia, initial encounter for closed fracture: Secondary | ICD-10-CM

## 2015-04-06 DIAGNOSIS — Z880 Allergy status to penicillin: Secondary | ICD-10-CM | POA: Insufficient documentation

## 2015-04-06 DIAGNOSIS — Z87891 Personal history of nicotine dependence: Secondary | ICD-10-CM | POA: Insufficient documentation

## 2015-04-06 DIAGNOSIS — S99911A Unspecified injury of right ankle, initial encounter: Secondary | ICD-10-CM | POA: Diagnosis present

## 2015-04-06 MED ORDER — FENTANYL CITRATE (PF) 100 MCG/2ML IJ SOLN
50.0000 ug | Freq: Once | INTRAMUSCULAR | Status: AC
  Administered 2015-04-06: 50 ug via INTRAVENOUS
  Filled 2015-04-06: qty 2

## 2015-04-06 MED ORDER — ONDANSETRON HCL 4 MG/2ML IJ SOLN
4.0000 mg | Freq: Four times a day (QID) | INTRAMUSCULAR | Status: DC | PRN
Start: 2015-04-06 — End: 2015-04-06
  Administered 2015-04-06: 4 mg via INTRAVENOUS
  Filled 2015-04-06: qty 2

## 2015-04-06 MED ORDER — HYDROMORPHONE HCL 1 MG/ML IJ SOLN
1.0000 mg | Freq: Once | INTRAMUSCULAR | Status: AC
  Administered 2015-04-06: 1 mg via INTRAVENOUS
  Filled 2015-04-06: qty 1

## 2015-04-06 MED ORDER — VENLAFAXINE HCL ER 150 MG PO CP24
150.0000 mg | ORAL_CAPSULE | Freq: Once | ORAL | Status: AC
  Administered 2015-04-06: 150 mg via ORAL
  Filled 2015-04-06: qty 1

## 2015-04-06 MED ORDER — MORPHINE SULFATE 4 MG/ML IJ SOLN
4.0000 mg | Freq: Once | INTRAMUSCULAR | Status: AC | PRN
  Administered 2015-04-06: 4 mg via INTRAVENOUS
  Filled 2015-04-06: qty 1

## 2015-04-06 MED ORDER — ONDANSETRON 4 MG PO TBDP: 4.0000 mg | ORAL_TABLET | Freq: Three times a day (TID) | ORAL | Status: DC | PRN

## 2015-04-06 MED ORDER — ETOMIDATE 2 MG/ML IV SOLN
0.2000 mg/kg | Freq: Once | INTRAVENOUS | Status: AC
  Administered 2015-04-06: 16.32 mg via INTRAVENOUS
  Filled 2015-04-06: qty 10

## 2015-04-06 MED ORDER — LORAZEPAM 2 MG/ML IJ SOLN
1.0000 mg | Freq: Once | INTRAMUSCULAR | Status: AC
  Administered 2015-04-06: 1 mg via INTRAVENOUS
  Filled 2015-04-06: qty 1

## 2015-04-06 MED ORDER — OXYCODONE-ACETAMINOPHEN 5-325 MG PO TABS: 1.0000 | ORAL_TABLET | ORAL | Status: DC | PRN

## 2015-04-06 NOTE — Progress Notes (Signed)
Orthopedic Tech Progress Note Patient Details:  Gina James 1980/11/10 [id number] Sedation and reduction of RLE. Posterior SLS with stirrup applied. Crutches provided. Ortho Devices Type of Ortho Device: Ace wrap, Crutches, Stirrup splint, Post (short leg) splint Ortho Device/Splint Location: RLE Ortho Device/Splint Interventions: Application   [name] 04/06/2015, 4:28 AM

## 2015-04-06 NOTE — ED Notes (Signed)
Conscious sedation complete.  Tolerated well.  No distress noted at this time.  Spouse remains at the bedside.

## 2015-04-06 NOTE — Discharge Instructions (Signed)
Please follow up with your primary care physician in 1-2 days. If you do not have one please call the [facility] number listed above. Please follow up with [facility] to schedule a follow up appointment.  Please take pain medication and/or muscle relaxants as prescribed and as needed for pain. Please do not drive on narcotic pain medication or on muscle relaxants. Please read all discharge instructions and return precautions.   Tibial Fracture, Adult You have a fracture (break in bone) of your tibia. This is the large "shin" bone in your lower leg. These fractures are easily diagnosed with x-rays. TREATMENT  You have a simple fracture which usually will heal without disability. It can be treated with simple immobilization. This means the bone can be held with a cast or splint in a favorable position until your caregiver feels it is stable (healed well enough). Then you can begin range of motion exercises to keep your knee and ankle limber (moving well). HOME CARE INSTRUCTIONS   Apply ice to the injury for 15-20 minutes, 03-04 times per day while awake, for 2 days. Put the ice in a plastic bag and place a thin towel between the bag of ice and your cast.  If you have a plaster or fiberglass cast:  Do not try to scratch the skin under the cast using sharp or pointed objects.  Check the skin around the cast every day. You may put lotion on any red or sore areas.  Keep your cast dry and clean.  If you have a plaster splint:  Wear the splint as directed.  You may loosen the elastic around the splint if your toes become numb, tingle, or turn cold or blue.  Do not put pressure on any part of your cast or splint until it is fully hardened.  Your cast or splint can be protected during bathing with a plastic bag. Do not lower the cast or splint into water.  Use crutches as directed.  Only take over-the-counter or prescription medicines for pain, discomfort, or  fever as directed by your caregiver.  See your caregiver as directed. It is very important to keep all follow-up referrals and appointments in order to avoid any long-term problems with your leg and ankle including chronic pain, inability to move the ankle normally, failure of the fracture to heal and permanent disability. SEEK IMMEDIATE MEDICAL CARE IF:   Pain is becoming worse rather than better, or if pain is uncontrolled with medications.  You have increased swelling, pain, or redness in the foot.  You begin to lose feeling in your foot or toes.  You develop a cold or blue foot or toes on the injured side.  You develop severe pain in your injured leg, especially if it is increased with movement of your toes. Document Released: 05/18/2001 Document Revised: 11/15/2011 Document Reviewed: 10/17/2013 [facility] Patient Information 2015 [place], [place]. This information is not intended to replace advice given to you by your health care provider. Make sure you discuss any questions you have with your health care provider.  Cast or Splint Care Casts and splints support injured limbs and keep bones from moving while they heal. It is important to care for your cast or splint at home.  HOME CARE INSTRUCTIONS  Keep the cast or splint uncovered during the drying period. It can take 24 to 48 hours to dry if it is made of plaster. A fiberglass cast will dry in less than 1 hour.  Do  not rest the cast on anything harder than a pillow for the first 24 hours.  Do not put weight on your injured limb or apply pressure to the cast until your health care provider gives you permission.  Keep the cast or splint dry. Wet casts or splints can lose their shape and may not support the limb as well. A wet cast that has lost its shape can also create harmful pressure on your skin when it dries. Also, wet skin can become infected.  Cover the cast or splint with a plastic bag when bathing or when out in the rain or  snow. If the cast is on the trunk of the body, take sponge baths until the cast is removed.  If your cast does become wet, dry it with a towel or a blow dryer on the cool setting only.  Keep your cast or splint clean. Soiled casts may be wiped with a moistened cloth.  Do not place any hard or soft foreign objects under your cast or splint, such as cotton, toilet paper, lotion, or powder.  Do not try to scratch the skin under the cast with any object. The object could get stuck inside the cast. Also, scratching could lead to an infection. If itching is a problem, use a blow dryer on a cool setting to relieve discomfort.  Do not trim or cut your cast or remove padding from inside of it.  Exercise all joints next to the injury that are not immobilized by the cast or splint. For example, if you have a long leg cast, exercise the hip joint and toes. If you have an arm cast or splint, exercise the shoulder, elbow, thumb, and fingers.  Elevate your injured arm or leg on 1 or 2 pillows for the first 1 to 3 days to decrease swelling and pain.It is best if you can comfortably elevate your cast so it is higher than your heart. SEEK MEDICAL CARE IF:   Your cast or splint cracks.  Your cast or splint is too tight or too loose.  You have unbearable itching inside the cast.  Your cast becomes wet or develops a soft spot or area.  You have a bad smell coming from inside your cast.  You get an object stuck under your cast.  Your skin around the cast becomes red or raw.  You have new pain or worsening pain after the cast has been applied. SEEK IMMEDIATE MEDICAL CARE IF:   You have fluid leaking through the cast.  You are unable to move your fingers or toes.  You have discolored (blue or white), cool, painful, or very swollen fingers or toes beyond the cast.  You have tingling or numbness around the injured area.  You have severe pain or pressure under the cast.  You have any difficulty  with your breathing or have shortness of breath.  You have chest pain. Document Released: 08/20/2000 Document Revised: 06/13/2013 Document Reviewed: 03/01/2013 [facility] Patient Information 2015 , [place]. This information is not intended to replace advice given to you by your health care provider. Make sure you discuss any questions you have with your health care provider.

## 2015-04-06 NOTE — ED Notes (Signed)
Taken for ct scan at this time.

## 2015-04-06 NOTE — ED Notes (Signed)
Awake and alert at this time.  Assisted from bed to wheelchair and wheelchair to car.  Tolerated well.  No distress noted.

## 2015-04-06 NOTE — ED Provider Notes (Signed)
CSN: [id number]     Arrival date & time 04/06/15  0230 History   First MD Initiated Contact with Patient 04/06/15 0234     Chief Complaint  Patient presents with  . Ankle Injury     (Consider location/radiation/quality/duration/timing/severity/associated sxs/prior Treatment) HPI Comments: Patient is a 34 yo F presenting to the ED for evaluation of right ankle pain. Patient states she was walking when she tripped over a rock and rolled her right ankle. She states her pain is located just above the ankle, severe, worse with palpation or movement. She endorses pain radiating up to her knee. Denies hitting her head or LOC. No modifying factors identified. No history of ankle surgeries.    Past Medical History  Diagnosis Date  . Anxiety   . Post partum depression    Past Surgical History  Procedure Laterality Date  . Cholecystectomy  2007  . Other surgical history  1997    "laproscopy to check for endometriosis"  . Cesarean section  2007 and 2009  . Liposuction    . Tummy tuck     Family History  Problem Relation Age of Onset  . Diabetes Father   . Cancer Sister 30    breast, had chemo/double mastectomy  . Heart disease Maternal Grandfather   . Heart disease Paternal Grandfather    History  Substance Use Topics  . Smoking status: Former Smoker -- 8 years    Types: Cigarettes    Quit date: 09/06/2004  . Smokeless tobacco: Never Used  . Alcohol Use: Yes     Comment: once a month or less   OB History    No data available     Review of Systems  Musculoskeletal: Positive for myalgias, joint swelling and arthralgias.  All other systems reviewed and are negative.     Allergies  Amoxicillin; Ceclor; Penicillins; Phenergan; Sulfa antibiotics; and Zithromax  Home Medications   Prior to Admission medications   Medication Sig Start Date End Date Taking? Authorizing Provider  doxycycline (VIBRAMYCIN) 100 MG capsule Take 100 mg by mouth 2 (two) times daily.    Historical  Provider, MD  metaxalone (SKELAXIN) 800 MG tablet Take 1 tablet (800 mg total) by mouth daily as needed for muscle spasms. 10/22/14   Debbrah Alar, NP  Multiple Vitamins-Minerals (MULTIVITAMIN WITH MINERALS) tablet Take 1 tablet by mouth daily.    Historical Provider, MD  Omega-3 Fatty Acids (FISH OIL) 1000 MG CAPS Take 1,000 mg by mouth daily.    Historical Provider, MD  ondansetron (ZOFRAN ODT) 4 MG disintegrating tablet Take 1 tablet (4 mg total) by mouth every 8 (eight) hours as needed for nausea or vomiting. 04/06/15   Baron Sane, PA-C  oxyCODONE-acetaminophen (PERCOCET/ROXICET) 5-325 MG per tablet Take 2 tablets by mouth every 4 (four) hours as needed for severe pain.    Historical Provider, MD  oxyCODONE-acetaminophen (PERCOCET/ROXICET) 5-325 MG per tablet Take 1 tablet by mouth every 4 (four) hours as needed for severe pain. May take 2 tablets PO q 6 hours for severe pain - Do not take with Tylenol as this tablet already contains tylenol 04/06/15   Baron Sane, PA-C  pyridOXINE (VITAMIN B-6) 100 MG tablet Take 1,000 mg by mouth daily.    Historical Provider, MD  venlafaxine XR (EFFEXOR-XR) 150 MG 24 hr capsule TAKE ONE CAPSULE BY MOUTH ONCE DAILY 01/07/15   Debbrah Alar, NP  venlafaxine XR (EFFEXOR-XR) 37.5 MG 24 hr capsule Take 1 capsule (37.5 mg total) by mouth daily. 06/10/14  Debbrah Alar, NP   BP 125/73 mmHg  Pulse 84  Temp(Src) 98.7 F (37.1 C) (Oral)  Resp 18  Ht 5\' 4"  (1.626 m)  Wt 180 lb (81.647 kg)  BMI 30.88 kg/m2  SpO2 97%  LMP 03/10/2015 (Approximate) Physical Exam  Constitutional: She is oriented to person, place, and time. She appears well-developed and well-nourished. No distress.  HENT:  Head: Normocephalic and atraumatic.  Right Ear: External ear normal.  Left Ear: External ear normal.  Nose: Nose normal.  Mouth/Throat: Oropharynx is clear and moist.  Eyes: Conjunctivae are normal.  Neck: Normal range of motion. Neck supple.  No  nuchal rigidity.   Cardiovascular: Normal rate, regular rhythm, normal heart sounds and intact distal pulses.   Pulmonary/Chest: Effort normal.  Abdominal: Soft.  Musculoskeletal:       Legs: Neurological: She is alert and oriented to person, place, and time.  Skin: Skin is warm and dry. She is not diaphoretic.  Psychiatric: She has a normal mood and affect.  Nursing note and vitals reviewed.   ED Course  Procedures (including critical care time) Medications  fentaNYL (SUBLIMAZE) injection 50 mcg (50 mcg Intravenous Given 04/06/15 0250)  morphine 4 MG/ML injection 4 mg (4 mg Intravenous Given 04/06/15 0258)  venlafaxine XR (EFFEXOR-XR) 24 hr capsule 150 mg (150 mg Oral Given 04/06/15 0331)  morphine 4 MG/ML injection 4 mg (4 mg Intravenous Given 04/06/15 0329)  etomidate (AMIDATE) injection 16.32 mg (16.32 mg Intravenous Given 04/06/15 0414)  LORazepam (ATIVAN) injection 1 mg (1 mg Intravenous Given 04/06/15 0425)  HYDROmorphone (DILAUDID) injection 1 mg (1 mg Intravenous Given 04/06/15 0504)    Labs Review Labs Reviewed - No data to display  Imaging Review Dg Ankle Complete Left  04/06/2015   CLINICAL DATA:  Tripped the knee rock twisting left ankle, now with left ankle pain.  EXAM: LEFT ANKLE COMPLETE - 3+ VIEW  COMPARISON:  None.  FINDINGS: Displaced spiral fracture of the distal tibial metaphysis with extension to the tibial plafond articular surface posteriorly. No definite associated fracture of the fibula. The mortise is congruent. Small plantar calcaneal spur and Achilles tendon enthesophyte. There is soft tissue edema at the fracture site.  IMPRESSION: Displaced spiral fracture of the distal tibial metaphysis extending to the tibial plafond posteriorly.   Electronically Signed   By: Jeb Levering M.D.   On: 04/06/2015 03:24   Ct Ankle Right Wo Contrast  04/06/2015   CLINICAL DATA:  Right ankle fracture postreduction.  EXAM: CT OF THE RIGHT ANKLE WITHOUT CONTRAST  TECHNIQUE:  Multidetector CT imaging of the right ankle was performed according to the standard protocol. Multiplanar CT image reconstructions were also generated.  COMPARISON:  Radiograph earlier this day.  FINDINGS: Oblique comminuted fracture of the distal tibial metaphysis terminating just above the medial malleolus anteriorly. There is a minimally displaced vertically-oriented component extending to the tibial plafond posteriorly. There is 3 mm articular surface depression and 6 mm osseous destruction of the articular surface posteriorly. Small fracture fragments at the articular surface. Fracture abuts the posterior tibial tendon with possible entrapment, image 62/126. No widening of the medial or lateral clear space, the mortise is preserved. The distal fibula is intact. No additional fracture of the hind or included midfoot. There is soft tissue edema at the fracture site.  IMPRESSION: Distal tibia fracture with extension to the articular surface posteriorly, articular surface offset and depression as described. The fracture abuts the posterior tibial tendon, with possible tendon entrapment.   Electronically  Signed   By: Jeb Levering M.D.   On: 04/06/2015 05:20     EKG Interpretation None      MDM   Final diagnoses:  Closed right tibial fracture, initial encounter   Filed Vitals:   04/06/15 0525  BP:   Pulse:   Temp: 98.7 F (37.1 C)  Resp:    Afebrile, NAD, non-toxic appearing, AAOx4.   I personally reviewed the imaging and agree with the radiologist.   Patient X-Ray with evidence of displaced spiral fracture of distal tibia. I personally reviewed the imaging and agree with the radiologist. Neurovascularly intact. Normal sensation. No evidence of compartment syndrome. Mild deformity on examination. Procedural sedation performed by Dr. Venora Maples along with reduction while in ED with splint placed for comfort. CT ankle ordered. Patient will be advised to follow up with her orthopedist at  [place] for re-evaluation. Return precautions discussed. Patient is agreeable to plan. Patient is stable at time of discharge. Patient d/w with Dr. Venora Maples, agrees with plan.       Baron Sane, PA-C 04/06/15 2006  Jola Schmidt, MD 04/07/15 [phone]

## 2015-04-06 NOTE — Telephone Encounter (Signed)
See mychart.  

## 2015-04-06 NOTE — ED Notes (Signed)
Taken to xray at this time. 

## 2015-04-06 NOTE — ED Notes (Signed)
Tripped over a rock and turned left ankle.  No LOC.

## 2015-04-07 ENCOUNTER — Encounter (HOSPITAL_COMMUNITY): Payer: Self-pay | Admitting: *Deleted

## 2015-04-07 ENCOUNTER — Other Ambulatory Visit: Payer: Self-pay | Admitting: Orthopedic Surgery

## 2015-04-07 MED ORDER — CHLORHEXIDINE GLUCONATE 4 % EX LIQD: 60.0000 mL | Freq: Once | CUTANEOUS | Status: DC

## 2015-04-07 MED ORDER — SODIUM CHLORIDE 0.9 % IV SOLN
INTRAVENOUS | Status: DC
  Administered 2015-04-08: 75 mL/h via INTRAVENOUS

## 2015-04-07 MED ORDER — VANCOMYCIN HCL IN DEXTROSE 1-5 GM/200ML-% IV SOLN
1000.0000 mg | INTRAVENOUS | Status: AC
  Administered 2015-04-08: 1000 mg via INTRAVENOUS
  Filled 2015-04-07: qty 200

## 2015-04-07 NOTE — Progress Notes (Signed)
Unable to bill pt for 30 minute, SDW -pre-op call; please bill on DOS.

## 2015-04-07 NOTE — Progress Notes (Signed)
Pt denies SOB, chest pain, and being under the care of a cardiologist. Pt denies having a stress test, echo and cardiac cath. Pt denies having a chest x ray and EKG within the last year. Pt made aware to stop taking Aspirin, otc vitamins and herbal medications such as Fish Oil. Do not take any NSAIDs ie: Ibuprofen, Advil, Naproxen or any medication containing Aspirin. Pt verbalized understanding of all pre-op instructions.

## 2015-04-08 ENCOUNTER — Ambulatory Visit (HOSPITAL_COMMUNITY): Payer: BLUE CROSS/BLUE SHIELD | Admitting: Anesthesiology

## 2015-04-08 ENCOUNTER — Inpatient Hospital Stay (HOSPITAL_COMMUNITY)
Admission: AD | Admit: 2015-04-08 | Discharge: 2015-04-10 | DRG: 494 | Disposition: A | Discharge status: Home / Self Care | Payer: BLUE CROSS/BLUE SHIELD | Source: Ambulatory Visit | Attending: Orthopedic Surgery | Admitting: Orthopedic Surgery

## 2015-04-08 ENCOUNTER — Encounter (HOSPITAL_COMMUNITY)
Admission: AD | Disposition: A | Discharge status: Home / Self Care | Payer: Self-pay | Source: Ambulatory Visit | Attending: Orthopedic Surgery

## 2015-04-08 ENCOUNTER — Encounter (HOSPITAL_COMMUNITY): Payer: Self-pay | Admitting: *Deleted

## 2015-04-08 DIAGNOSIS — W19XXXA Unspecified fall, initial encounter: Secondary | ICD-10-CM | POA: Diagnosis present

## 2015-04-08 DIAGNOSIS — Z87891 Personal history of nicotine dependence: Secondary | ICD-10-CM

## 2015-04-08 DIAGNOSIS — F419 Anxiety disorder, unspecified: Secondary | ICD-10-CM | POA: Diagnosis present

## 2015-04-08 DIAGNOSIS — F329 Major depressive disorder, single episode, unspecified: Secondary | ICD-10-CM | POA: Diagnosis present

## 2015-04-08 DIAGNOSIS — S82871A Displaced pilon fracture of right tibia, initial encounter for closed fracture: Principal | ICD-10-CM | POA: Diagnosis present

## 2015-04-08 DIAGNOSIS — S82873A Displaced pilon fracture of unspecified tibia, initial encounter for closed fracture: Secondary | ICD-10-CM | POA: Diagnosis present

## 2015-04-08 HISTORY — DX: Other complications of anesthesia, initial encounter: T88.59XA

## 2015-04-08 HISTORY — DX: Low back pain, unspecified: M54.50

## 2015-04-08 HISTORY — DX: Other fracture of right lower leg, initial encounter for closed fracture: S82.891A

## 2015-04-08 HISTORY — DX: Low back pain: M54.5

## 2015-04-08 HISTORY — DX: Headache, unspecified: R51.9

## 2015-04-08 HISTORY — DX: Other chronic pain: G89.29

## 2015-04-08 HISTORY — DX: Headache: R51

## 2015-04-08 HISTORY — PX: OPEN REDUCTION INTERNAL FIXATION (ORIF) TIBIA/FIBULA FRACTURE: SHX5992

## 2015-04-08 HISTORY — DX: Unspecified osteoarthritis, unspecified site: M19.90

## 2015-04-08 HISTORY — DX: Adverse effect of unspecified anesthetic, initial encounter: T41.45XA

## 2015-04-08 HISTORY — PX: TIBIA FRACTURE SURGERY: SHX806

## 2015-04-08 LAB — CBC
HEMATOCRIT: 41 % (ref 36.0–46.0)
HEMATOCRIT: 39.3 % (ref 36.0–46.0)
Hemoglobin: 14 g/dL (ref 12.0–15.0)
Hemoglobin: 13.3 g/dL (ref 12.0–15.0)
MCH: 29 pg (ref 26.0–34.0)
MCH: 28.5 pg (ref 26.0–34.0)
MCHC: 34.1 g/dL (ref 30.0–36.0)
MCHC: 33.8 g/dL (ref 30.0–36.0)
MCV: 84.9 fL (ref 78.0–100.0)
MCV: 84.3 fL (ref 78.0–100.0)
Platelets: 193 10*3/uL (ref 150–400)
Platelets: 192 10*3/uL (ref 150–400)
RBC: 4.83 MIL/uL (ref 3.87–5.11)
RBC: 4.66 MIL/uL (ref 3.87–5.11)
RDW: 13.5 % (ref 11.5–15.5)
RDW: 13.4 % (ref 11.5–15.5)
WBC: 7.2 10*3/uL (ref 4.0–10.5)
WBC: 7.1 10*3/uL (ref 4.0–10.5)

## 2015-04-08 LAB — SURGICAL PCR SCREEN
MRSA, PCR: NEGATIVE
STAPHYLOCOCCUS AUREUS: NEGATIVE

## 2015-04-08 LAB — HCG, QUANTITATIVE, PREGNANCY: hCG, Beta Chain, Quant, S: 1 m[IU]/mL (ref <5)

## 2015-04-08 LAB — CREATININE, SERUM
Creatinine, Ser: 0.79 mg/dL (ref 0.44–1.00)
GFR calc Af Amer: >60 mL/min (ref >60)

## 2015-04-08 SURGERY — OPEN REDUCTION INTERNAL FIXATION (ORIF) TIBIA/FIBULA FRACTURE
Anesthesia: Regional | Site: Ankle | Laterality: Right

## 2015-04-08 MED ORDER — METOCLOPRAMIDE HCL 5 MG PO TABS: 5.0000 mg | ORAL_TABLET | Freq: Three times a day (TID) | ORAL | Status: DC | PRN

## 2015-04-08 MED ORDER — NAPROXEN 500 MG PO TABS
500.0000 mg | ORAL_TABLET | Freq: Two times a day (BID) | ORAL | Status: DC
  Administered 2015-04-09 – 2015-04-10 (×3): 500 mg via ORAL
  Filled 2015-04-08 (×2): qty 1
  Filled 2015-04-08 (×2): qty 2
  Filled 2015-04-08 (×4): qty 1
  Filled 2015-04-08: qty 2

## 2015-04-08 MED ORDER — DEXAMETHASONE SODIUM PHOSPHATE 4 MG/ML IJ SOLN
INTRAMUSCULAR | Status: DC | PRN
  Administered 2015-04-08: 4 mg via INTRAVENOUS

## 2015-04-08 MED ORDER — METHOCARBAMOL 1000 MG/10ML IJ SOLN
500.0000 mg | Freq: Four times a day (QID) | INTRAVENOUS | Status: DC | PRN
  Administered 2015-04-08 – 2015-04-10 (×5): 500 mg via INTRAVENOUS
  Filled 2015-04-08 (×5): qty 5

## 2015-04-08 MED ORDER — LACTATED RINGERS IV SOLN
INTRAVENOUS | Status: DC
  Administered 2015-04-08: 14:00:00 via INTRAVENOUS

## 2015-04-08 MED ORDER — FENTANYL CITRATE (PF) 100 MCG/2ML IJ SOLN
INTRAMUSCULAR | Status: AC
  Administered 2015-04-08: 100 ug/mL via INTRAVENOUS
  Filled 2015-04-08: qty 2

## 2015-04-08 MED ORDER — ONDANSETRON HCL 4 MG/2ML IJ SOLN
INTRAMUSCULAR | Status: DC | PRN
  Administered 2015-04-08: 4 mg via INTRAVENOUS

## 2015-04-08 MED ORDER — OXYCODONE HCL 5 MG PO TABS
ORAL_TABLET | ORAL | Status: AC
  Filled 2015-04-08: qty 2

## 2015-04-08 MED ORDER — ACETAMINOPHEN 325 MG PO TABS
650.0000 mg | ORAL_TABLET | Freq: Four times a day (QID) | ORAL | Status: DC | PRN
  Administered 2015-04-09: 650 mg via ORAL
  Filled 2015-04-08: qty 2

## 2015-04-08 MED ORDER — LACTATED RINGERS IV SOLN
INTRAVENOUS | Status: DC | PRN
  Administered 2015-04-08 (×2): via INTRAVENOUS

## 2015-04-08 MED ORDER — DOCUSATE SODIUM 100 MG PO CAPS
100.0000 mg | ORAL_CAPSULE | Freq: Two times a day (BID) | ORAL | Status: DC
Start: 2015-04-08 — End: 2015-04-10
  Administered 2015-04-08 – 2015-04-10 (×4): 100 mg via ORAL
  Filled 2015-04-08 (×4): qty 1

## 2015-04-08 MED ORDER — PROPOFOL 10 MG/ML IV BOLUS
INTRAVENOUS | Status: DC | PRN
  Administered 2015-04-08: 160 mg via INTRAVENOUS

## 2015-04-08 MED ORDER — FENTANYL CITRATE (PF) 100 MCG/2ML IJ SOLN
25.0000 ug | INTRAMUSCULAR | Status: DC | PRN
  Administered 2015-04-08: 25 ug via INTRAVENOUS
  Administered 2015-04-08: 100 ug/mL via INTRAVENOUS
  Administered 2015-04-08: 25 ug via INTRAVENOUS
  Administered 2015-04-08: 50 ug via INTRAVENOUS

## 2015-04-08 MED ORDER — METOCLOPRAMIDE HCL 5 MG/ML IJ SOLN: 5.0000 mg | Freq: Three times a day (TID) | INTRAMUSCULAR | Status: DC | PRN

## 2015-04-08 MED ORDER — MORPHINE SULFATE 2 MG/ML IJ SOLN
2.0000 mg | INTRAMUSCULAR | Status: DC | PRN
  Administered 2015-04-08 – 2015-04-09 (×2): 2 mg via INTRAVENOUS
  Filled 2015-04-08 (×2): qty 1

## 2015-04-08 MED ORDER — ACETAMINOPHEN 325 MG PO TABS
325.0000 mg | ORAL_TABLET | ORAL | Status: DC | PRN
Start: 2015-04-08 — End: 2015-04-08

## 2015-04-08 MED ORDER — PROPOFOL 10 MG/ML IV BOLUS
INTRAVENOUS | Status: AC
  Filled 2015-04-08: qty 20

## 2015-04-08 MED ORDER — OXYCODONE HCL 5 MG PO TABS
5.0000 mg | ORAL_TABLET | ORAL | Status: DC | PRN
  Administered 2015-04-08 – 2015-04-10 (×13): 10 mg via ORAL
  Filled 2015-04-08 (×12): qty 2

## 2015-04-08 MED ORDER — LIDOCAINE HCL (CARDIAC) 20 MG/ML IV SOLN
INTRAVENOUS | Status: AC
  Filled 2015-04-08: qty 5

## 2015-04-08 MED ORDER — MUPIROCIN 2 % EX OINT
TOPICAL_OINTMENT | CUTANEOUS | Status: AC
  Filled 2015-04-08: qty 22

## 2015-04-08 MED ORDER — KETOROLAC TROMETHAMINE 60 MG/2ML IM SOLN
60.0000 mg | Freq: Once | INTRAMUSCULAR | Status: AC
  Administered 2015-04-08: 60 mg via INTRAMUSCULAR
  Filled 2015-04-08: qty 2

## 2015-04-08 MED ORDER — ACETAMINOPHEN 160 MG/5ML PO SOLN
325.0000 mg | ORAL | Status: DC | PRN
  Filled 2015-04-08: qty 20.3

## 2015-04-08 MED ORDER — LIDOCAINE HCL (CARDIAC) 20 MG/ML IV SOLN
INTRAVENOUS | Status: DC | PRN
  Administered 2015-04-08: 60 mg via INTRAVENOUS

## 2015-04-08 MED ORDER — ONDANSETRON HCL 4 MG/2ML IJ SOLN
INTRAMUSCULAR | Status: AC
  Filled 2015-04-08: qty 2

## 2015-04-08 MED ORDER — VANCOMYCIN HCL IN DEXTROSE 1-5 GM/200ML-% IV SOLN
1000.0000 mg | Freq: Two times a day (BID) | INTRAVENOUS | Status: AC
  Administered 2015-04-09: 1000 mg via INTRAVENOUS
  Filled 2015-04-08 (×2): qty 200

## 2015-04-08 MED ORDER — MIDAZOLAM HCL 2 MG/2ML IJ SOLN
INTRAMUSCULAR | Status: AC
  Administered 2015-04-08: 2 mg
  Filled 2015-04-08: qty 2

## 2015-04-08 MED ORDER — 0.9 % SODIUM CHLORIDE (POUR BTL) OPTIME
TOPICAL | Status: DC | PRN
  Administered 2015-04-08: 1000 mL

## 2015-04-08 MED ORDER — BUPIVACAINE-EPINEPHRINE (PF) 0.5% -1:200000 IJ SOLN
INTRAMUSCULAR | Status: DC | PRN
  Administered 2015-04-08: 30 mL via PERINEURAL

## 2015-04-08 MED ORDER — VITAMIN B-6 100 MG PO TABS: 1000.0000 mg | ORAL_TABLET | Freq: Every day | ORAL | Status: DC

## 2015-04-08 MED ORDER — SENNA 8.6 MG PO TABS
1.0000 | ORAL_TABLET | Freq: Two times a day (BID) | ORAL | Status: DC
  Administered 2015-04-08 – 2015-04-10 (×4): 8.6 mg via ORAL
  Filled 2015-04-08 (×4): qty 1

## 2015-04-08 MED ORDER — OMEGA-3-ACID ETHYL ESTERS 1 G PO CAPS
1.0000 g | ORAL_CAPSULE | Freq: Every day | ORAL | Status: DC
  Administered 2015-04-08 – 2015-04-10 (×3): 1 g via ORAL
  Filled 2015-04-08 (×3): qty 1

## 2015-04-08 MED ORDER — VENLAFAXINE HCL ER 75 MG PO CP24
150.0000 mg | ORAL_CAPSULE | Freq: Every day | ORAL | Status: DC
  Administered 2015-04-08 – 2015-04-10 (×3): 150 mg via ORAL
  Filled 2015-04-08 (×3): qty 2

## 2015-04-08 MED ORDER — FENTANYL CITRATE (PF) 100 MCG/2ML IJ SOLN
INTRAMUSCULAR | Status: AC
  Administered 2015-04-08: 50 ug via INTRAVENOUS
  Filled 2015-04-08: qty 2

## 2015-04-08 MED ORDER — MUPIROCIN 2 % EX OINT
1.0000 "application " | TOPICAL_OINTMENT | Freq: Once | CUTANEOUS | Status: AC
  Administered 2015-04-08: 1 via TOPICAL

## 2015-04-08 MED ORDER — ENOXAPARIN SODIUM 40 MG/0.4ML ~~LOC~~ SOLN
40.0000 mg | SUBCUTANEOUS | Status: DC
  Administered 2015-04-09 – 2015-04-10 (×2): 40 mg via SUBCUTANEOUS
  Filled 2015-04-08 (×2): qty 0.4

## 2015-04-08 MED ORDER — FENTANYL CITRATE (PF) 250 MCG/5ML IJ SOLN
INTRAMUSCULAR | Status: AC
  Filled 2015-04-08: qty 5

## 2015-04-08 MED ORDER — FENTANYL CITRATE (PF) 100 MCG/2ML IJ SOLN
INTRAMUSCULAR | Status: DC | PRN
  Administered 2015-04-08: 25 ug via INTRAVENOUS

## 2015-04-08 MED ORDER — ONDANSETRON HCL 4 MG/2ML IJ SOLN: 4.0000 mg | Freq: Four times a day (QID) | INTRAMUSCULAR | Status: DC | PRN

## 2015-04-08 MED ORDER — SODIUM CHLORIDE 0.9 % IV SOLN: INTRAVENOUS | Status: DC

## 2015-04-08 MED ORDER — ONDANSETRON 4 MG PO TBDP: 4.0000 mg | ORAL_TABLET | Freq: Three times a day (TID) | ORAL | Status: DC | PRN

## 2015-04-08 MED ORDER — ONDANSETRON HCL 4 MG PO TABS: 4.0000 mg | ORAL_TABLET | Freq: Four times a day (QID) | ORAL | Status: DC | PRN

## 2015-04-08 MED ORDER — DEXAMETHASONE SODIUM PHOSPHATE 4 MG/ML IJ SOLN
INTRAMUSCULAR | Status: AC
  Filled 2015-04-08: qty 1

## 2015-04-08 MED ORDER — ADULT MULTIVITAMIN W/MINERALS CH
1.0000 | ORAL_TABLET | Freq: Every day | ORAL | Status: DC
  Administered 2015-04-08 – 2015-04-10 (×3): 1 via ORAL
  Filled 2015-04-08 (×3): qty 1

## 2015-04-08 MED ORDER — ROPIVACAINE HCL 5 MG/ML IJ SOLN
INTRAMUSCULAR | Status: DC | PRN
  Administered 2015-04-08: 15 mL via PERINEURAL

## 2015-04-08 MED ORDER — ACETAMINOPHEN 650 MG RE SUPP: 650.0000 mg | Freq: Four times a day (QID) | RECTAL | Status: DC | PRN

## 2015-04-08 SURGICAL SUPPLY — 83 items
BANDAGE ESMARK 6X9 LF (GAUZE/BANDAGES/DRESSINGS) ×1 IMPLANT
BIT DRILL 2.5X2.75 QC CALB (BIT) ×2 IMPLANT
BIT DRILL CALIBRATED 2.7 (BIT) ×2 IMPLANT
BLADE SURG 15 STRL LF DISP TIS (BLADE) ×1 IMPLANT
BLADE SURG 15 STRL SS (BLADE) ×1
BNDG COHESIVE 4X5 TAN STRL (GAUZE/BANDAGES/DRESSINGS) ×2 IMPLANT
BNDG COHESIVE 6X5 TAN STRL LF (GAUZE/BANDAGES/DRESSINGS) ×2 IMPLANT
BNDG ESMARK 6X9 LF (GAUZE/BANDAGES/DRESSINGS) ×2
CANISTER SUCT 3000ML PPV (MISCELLANEOUS) IMPLANT
CHLORAPREP W/TINT 26ML (MISCELLANEOUS) ×2 IMPLANT
COVER SURGICAL LIGHT HANDLE (MISCELLANEOUS) ×2 IMPLANT
CUFF TOURNIQUET SINGLE 34IN LL (TOURNIQUET CUFF) ×2 IMPLANT
CUFF TOURNIQUET SINGLE 44IN (TOURNIQUET CUFF) IMPLANT
DRAPE C-ARM 42X72 X-RAY (DRAPES) IMPLANT
DRAPE C-ARMOR (DRAPES) IMPLANT
DRAPE INCISE IOBAN 66X45 STRL (DRAPES) IMPLANT
DRAPE OEC MINIVIEW 54X84 (DRAPES) ×2 IMPLANT
DRAPE ORTHO SPLIT 77X108 STRL (DRAPES)
DRAPE SURG ORHT 6 SPLT 77X108 (DRAPES) IMPLANT
DRAPE U-SHAPE 47X51 STRL (DRAPES) ×2 IMPLANT
DRILL SLEEVE 2.7 DIST TIB (TRAUMA) ×1
DRSG ADAPTIC 3X8 NADH LF (GAUZE/BANDAGES/DRESSINGS) IMPLANT
DRSG MEPILEX BORDER 4X4 (GAUZE/BANDAGES/DRESSINGS) ×2 IMPLANT
DRSG MEPITEL 4X7.2 (GAUZE/BANDAGES/DRESSINGS) ×2 IMPLANT
DRSG PAD ABDOMINAL 8X10 ST (GAUZE/BANDAGES/DRESSINGS) ×4 IMPLANT
ELECT REM PT RETURN 9FT ADLT (ELECTROSURGICAL) ×2
ELECTRODE REM PT RTRN 9FT ADLT (ELECTROSURGICAL) ×1 IMPLANT
GAUZE SPONGE 4X4 12PLY STRL (GAUZE/BANDAGES/DRESSINGS) IMPLANT
GLOVE BIO SURGEON STRL SZ7 (GLOVE) ×4 IMPLANT
GLOVE BIO SURGEON STRL SZ8 (GLOVE) ×2 IMPLANT
GLOVE BIOGEL PI IND STRL 6.5 (GLOVE) ×1 IMPLANT
GLOVE BIOGEL PI IND STRL 7.0 (GLOVE) ×3 IMPLANT
GLOVE BIOGEL PI IND STRL 7.5 (GLOVE) ×1 IMPLANT
GLOVE BIOGEL PI IND STRL 8 (GLOVE) ×1 IMPLANT
GLOVE BIOGEL PI INDICATOR 6.5 (GLOVE) ×1
GLOVE BIOGEL PI INDICATOR 7.0 (GLOVE) ×3
GLOVE BIOGEL PI INDICATOR 7.5 (GLOVE) ×1
GLOVE BIOGEL PI INDICATOR 8 (GLOVE) ×1
GLOVE SURG SS PI 7.0 STRL IVOR (GLOVE) ×6 IMPLANT
GOWN STRL REUS W/ TWL LRG LVL3 (GOWN DISPOSABLE) ×2 IMPLANT
GOWN STRL REUS W/ TWL XL LVL3 (GOWN DISPOSABLE) ×2 IMPLANT
GOWN STRL REUS W/TWL LRG LVL3 (GOWN DISPOSABLE) ×2
GOWN STRL REUS W/TWL XL LVL3 (GOWN DISPOSABLE) ×2
KIT BASIN OR (CUSTOM PROCEDURE TRAY) ×2 IMPLANT
KIT ROOM TURNOVER OR (KITS) ×2 IMPLANT
MANIFOLD NEPTUNE II (INSTRUMENTS) ×2 IMPLANT
NEEDLE 22X1 1/2 (OR ONLY) (NEEDLE) IMPLANT
NS IRRIG 1000ML POUR BTL (IV SOLUTION) ×2 IMPLANT
PACK ORTHO EXTREMITY (CUSTOM PROCEDURE TRAY) ×2 IMPLANT
PAD ARMBOARD 7.5X6 YLW CONV (MISCELLANEOUS) ×4 IMPLANT
PAD CAST 4YDX4 CTTN HI CHSV (CAST SUPPLIES) ×1 IMPLANT
PADDING CAST COTTON 4X4 STRL (CAST SUPPLIES) ×1
PADDING CAST COTTON 6X4 STRL (CAST SUPPLIES) ×2 IMPLANT
PLATE 6H RT DIST ANTLAT TIB (Plate) ×1 IMPLANT
PLATE ANTLAT CNTR NAR 114X6 (Plate) ×1 IMPLANT
SCREW CANC LAG 4X50 (Screw) ×2 IMPLANT
SCREW CORTICAL 3.5MM  28MM (Screw) ×1 IMPLANT
SCREW CORTICAL 3.5MM 26MM (Screw) ×2 IMPLANT
SCREW CORTICAL 3.5MM 28MM (Screw) ×1 IMPLANT
SCREW LOCK CORT STAR 3.5X22 (Screw) ×2 IMPLANT
SCREW LOCK CORT STAR 3.5X30 (Screw) ×2 IMPLANT
SCREW LOCK CORT STAR 3.5X42 (Screw) ×6 IMPLANT
SLEEVE DRILL 2.7 DIST TIB (TRAUMA) ×1 IMPLANT
SPLINT PLASTER CAST XFAST 5X30 (CAST SUPPLIES) ×1 IMPLANT
SPLINT PLASTER XFAST SET 5X30 (CAST SUPPLIES) ×1
SPONGE GAUZE 4X4 12PLY STER LF (GAUZE/BANDAGES/DRESSINGS) ×2 IMPLANT
SPONGE LAP 18X18 X RAY DECT (DISPOSABLE) IMPLANT
STAPLER VISISTAT 35W (STAPLE) IMPLANT
SUCTION FRAZIER TIP 10 FR DISP (SUCTIONS) ×2 IMPLANT
SUT ETHILON 3 0 PS 1 (SUTURE) ×2 IMPLANT
SUT MNCRL AB 3-0 PS2 18 (SUTURE) ×2 IMPLANT
SUT VIC AB 0 CT1 27 (SUTURE) ×1
SUT VIC AB 0 CT1 27XBRD ANBCTR (SUTURE) ×1 IMPLANT
SUT VIC AB 2-0 FS1 27 (SUTURE) IMPLANT
SYR CONTROL 10ML LL (SYRINGE) IMPLANT
TAP CORT 3.5MM SOLID DISP (TRAUMA) ×2 IMPLANT
TOWEL OR 17X24 6PK STRL BLUE (TOWEL DISPOSABLE) ×2 IMPLANT
TOWEL OR 17X26 10 PK STRL BLUE (TOWEL DISPOSABLE) ×2 IMPLANT
TRAY FOLEY CATH SILVER 16FR (SET/KITS/TRAYS/PACK) ×2 IMPLANT
TUBE CONNECTING 12X1/4 (SUCTIONS) ×2 IMPLANT
WATER STERILE IRR 1000ML POUR (IV SOLUTION) IMPLANT
WIRE K 1.6MM 144256 (MISCELLANEOUS) ×4 IMPLANT
YANKAUER SUCT BULB TIP NO VENT (SUCTIONS) ×2 IMPLANT

## 2015-04-08 NOTE — Transfer of Care (Signed)
Immediate Anesthesia Transfer of Care Note  Patient: Gina James  Procedure(s) Performed: Procedure(s): OPEN REDUCTION INTERNAL FIXATION (ORIF)  RIGHT TIBIA PILON FRACTURE (Right)  Patient Location: PACU  Anesthesia Type:General  Level of Consciousness: awake, alert  and oriented  Airway & Oxygen Therapy: Patient Spontanous Breathing and Patient connected to nasal cannula oxygen  Post-op Assessment: Report given to RN and Post -op Vital signs reviewed and stable  Post vital signs: Reviewed and stable  Last Vitals:  Filed Vitals:   04/08/15 1730  BP:   Pulse:   Temp: 37.1 C  Resp:     Complications: No apparent anesthesia complications

## 2015-04-08 NOTE — Progress Notes (Signed)
Pt transferred from the PACU at 2000 to [id number]; pt a/ox4.

## 2015-04-08 NOTE — Brief Op Note (Signed)
04/08/2015  5:22 PM  PATIENT:  Gina James  34 y.o. female  PRE-OPERATIVE DIAGNOSIS:  RIGHT TIBIAL PILON FRACTURE  POST-OPERATIVE DIAGNOSIS:  RIGHT TIBIIAL PILON FRACTURE  Procedure(s): 1.  OPEN REDUCTION INTERNAL FIXATION (ORIF)  RIGHT TIBIA PILON FRACTURE 2.  AP, lateral and mortise radiographs of the right ankle  SURGEON:  Wylene Simmer, MD  ASSISTANT: Mechele Claude, PA-C  ANESTHESIA:   General, regional  EBL:  minimal   TOURNIQUET:  1:00 at 935 mm Hg  COMPLICATIONS:  None apparent  DISPOSITION:  Extubated, awake and stable to recovery.  DICTATION ID:  [id number]

## 2015-04-08 NOTE — Anesthesia Preprocedure Evaluation (Signed)
Anesthesia Evaluation  Patient identified by MRN, date of birth, ID band Patient awake    Reviewed: Allergy & Precautions, NPO status , Patient's Chart, lab work & pertinent test results  History of Anesthesia Complications Negative for: history of anesthetic complications  Airway Mallampati: II  TM Distance: >3 FB Neck ROM: Full    Dental  (+) Teeth Intact   Pulmonary neg shortness of breath, neg sleep apnea, neg COPDneg recent URI, former smoker,  breath sounds clear to auscultation        Cardiovascular negative cardio ROS  Rhythm:Regular     Neuro/Psych PSYCHIATRIC DISORDERS Anxiety Depression negative neurological ROS     GI/Hepatic negative GI ROS, Neg liver ROS,   Endo/Other  negative endocrine ROS  Renal/GU negative Renal ROS     Musculoskeletal negative musculoskeletal ROS (+)   Abdominal   Peds  Hematology negative hematology ROS (+)   Anesthesia Other Findings   Reproductive/Obstetrics                             Anesthesia Physical Anesthesia Plan  ASA: II  Anesthesia Plan: General and Regional   Post-op Pain Management:    Induction: Intravenous  Airway Management Planned: LMA  Additional Equipment: None  Intra-op Plan:   Post-operative Plan: Extubation in OR  Informed Consent: I have reviewed the patients History and Physical, chart, labs and discussed the procedure including the risks, benefits and alternatives for the proposed anesthesia with the patient or authorized representative who has indicated his/her understanding and acceptance.   Dental advisory given  Plan Discussed with: CRNA and Surgeon  Anesthesia Plan Comments:         Anesthesia Quick Evaluation

## 2015-04-08 NOTE — H&P (Signed)
Gina James is an 34 y.o. female.   Chief Complaint:  Right ankle injury HPI:  34 y/o female without significant PMH fell in the early morning hours of SUnday 8/31.  She was seen in the ER where CT scan and xrays showed a right tibial pilon fracture.  She presents now for ORIF v. Ex fix and closed reduction depending on the condition of her skin.  Past Medical History  Diagnosis Date  . Anxiety   . Post partum depression   . Complication of anesthesia     difficulty urinating after anesthesia  . Ankle fracture, right     Past Surgical History  Procedure Laterality Date  . Cholecystectomy  2007  . Other surgical history  1997    "laproscopy to check for endometriosis"  . Cesarean section  2007 and 2009  . Liposuction    . Tummy tuck      Family History  Problem Relation Age of Onset  . Diabetes Father   . Cancer Sister 30    breast, had chemo/double mastectomy  . Heart disease Maternal Grandfather   . Heart disease Paternal Grandfather    Social History:  reports that she quit smoking about 10 years ago. Her smoking use included Cigarettes. She quit after 8 years of use. She has never used smokeless tobacco. She reports that she drinks alcohol. She reports that she does not use illicit drugs.  Allergies:  Allergies  Allergen Reactions  . Amoxicillin Hives  . Ceclor [Cefaclor] Hives  . Penicillins Hives  . Phenergan [Promethazine Hcl] Hives  . Sulfa Antibiotics Hives  . Zithromax [Azithromycin] Hives    Medications Prior to Admission  Medication Sig Dispense Refill  . Multiple Vitamins-Minerals (MULTIVITAMIN WITH MINERALS) tablet Take 1 tablet by mouth daily.    . Omega-3 Fatty Acids (FISH OIL) 1000 MG CAPS Take 1,000 mg by mouth daily.    . ondansetron (ZOFRAN ODT) 4 MG disintegrating tablet Take 1 tablet (4 mg total) by mouth every 8 (eight) hours as needed for nausea or vomiting. 20 tablet 0  . oxyCODONE-acetaminophen (PERCOCET/ROXICET) 5-325 MG per tablet Take 1  tablet by mouth every 4 (four) hours as needed for severe pain. May take 2 tablets PO q 6 hours for severe pain - Do not take with Tylenol as this tablet already contains tylenol (Patient taking differently: Take 1-2 tablets by mouth See admin instructions. Take 1 tablet every 4 hours or 2 tablets every 6 hours as needed for severe pain. Do not take with Tylenol as this tablet already contains tylenol) 25 tablet 0  . venlafaxine XR (EFFEXOR-XR) 150 MG 24 hr capsule TAKE ONE CAPSULE BY MOUTH ONCE DAILY (Patient taking differently: TAKE ONE CAPSULE BY MOUTH ONCE DAILY AT BEDTIME) 30 capsule 2  . metaxalone (SKELAXIN) 800 MG tablet Take 1 tablet (800 mg total) by mouth daily as needed for muscle spasms. (Patient not taking: Reported on 04/07/2015) 30 tablet 0  . naproxen sodium (ANAPROX) 220 MG tablet Take 440-660 mg by mouth daily as needed (pain). ALEVE    . pyridOXINE (VITAMIN B-6) 100 MG tablet Take 1,000 mg by mouth daily.    Marland Kitchen venlafaxine XR (EFFEXOR-XR) 37.5 MG 24 hr capsule Take 1 capsule (37.5 mg total) by mouth daily. (Patient not taking: Reported on 04/07/2015) 30 capsule 5    Results for orders placed or performed during the hospital encounter of 04/08/15 (from the past 48 hour(s))  CBC     Status: None  Collection Time: April 16, 2015  1:32 PM  Result Value Ref Range   WBC 7.2 4.0 - 10.5 K/uL   RBC 4.83 3.87 - 5.11 MIL/uL   Hemoglobin 14.0 12.0 - 15.0 g/dL   HCT 41.0 36.0 - 46.0 %   MCV 84.9 78.0 - 100.0 fL   MCH 29.0 26.0 - 34.0 pg   MCHC 34.1 30.0 - 36.0 g/dL   RDW 13.5 11.5 - 15.5 %   Platelets 193 150 - 400 K/uL  hCG, quantitative, pregnancy     Status: None   Collection Time: 04-16-2015  1:33 PM  Result Value Ref Range   hCG, Beta Chain, Quant, S 1 <5 mIU/mL    Comment:          GEST. AGE      CONC.  (mIU/mL)   <=1 WEEK        5 - 50     2 WEEKS       50 - 500     3 WEEKS       100 - 10,000     4 WEEKS     1,000 - 30,000     5 WEEKS     3,500 - 115,000   6-8 WEEKS     12,000 -  270,000    12 WEEKS     15,000 - 220,000        FEMALE AND NON-PREGNANT FEMALE:     LESS THAN 5 mIU/mL    No results found.  ROS  No recent f/cn/v/wt loss  Blood pressure 108/65, pulse 68, temperature 98.9 F (37.2 C), temperature source Oral, resp. rate 18, height 5\' 4"  (1.626 m), weight 81.647 kg (180 lb), last menstrual period 03/12/2015, SpO2 100 %. Physical Exam  wn wd woman in nad.  A and O x 4.  Mood and affect normal.  EOMI.  resp unlabored.  R ankle with healthy skin and no blisters.  Moderately swollen.  sens to LT intact dorsally and plantarly.  5/5 strength in PF and DF of the toes.  No lymphadenopathy.  Assessment/Plan R tibial pilon fracture.  To OR For ORIF v. Closed reduction and ex fix.  The risks and benefits of the alternative treatment options have been discussed in detail.  The patient wishes to proceed with surgery and specifically understands risks of bleeding, infection, nerve damage, blood clots, need for additional surgery, amputation and death.   Wylene Simmer April 16, 2015, 3:07 PM

## 2015-04-08 NOTE — Anesthesia Procedure Notes (Addendum)
Anesthesia Regional Block:  Popliteal block  Pre-Anesthetic Checklist: ,, timeout performed, Correct Patient, Correct Site, Correct Laterality, Correct Procedure, Correct Position, site marked, Risks and benefits discussed,  Surgical consent,  Pre-op evaluation,  At surgeon's request and post-op pain management  Laterality: Lower and Right  Prep: chloraprep       Needles:  Injection technique: Single-shot  Needle Type: Echogenic Stimulator Needle          Additional Needles:  Procedures: ultrasound guided (picture in chart) and nerve stimulator Popliteal block  Nerve Stimulator or Paresthesia:  Response: plantar, 0.5 mA,   Additional Responses:   Narrative:  Injection made incrementally with aspirations every 5 mL.  Performed by: Personally  Anesthesiologist: Cyriah Childrey, CHRIS  Additional Notes: H+P and labs reviewed, risks and benefits discussed with patient, procedure tolerated well without complications. Right Saphenous under US guidance done without complication.    Procedure Name: LMA Insertion Date/Time: 04/08/2015 3:46 PM Performed by: Susa Loffler Pre-anesthesia Checklist: Timeout performed, Patient identified, Emergency Drugs available, Suction available and Patient being monitored Patient Re-evaluated:Patient Re-evaluated prior to inductionOxygen Delivery Method: Circle system utilized Preoxygenation: Pre-oxygenation with 100% oxygen Intubation Type: IV induction LMA: LMA inserted LMA Size: 4.0 Number of attempts: 1 Placement Confirmation: positive ETCO2 and breath sounds checked- equal and bilateral Tube secured with: Tape Dental Injury: Teeth and Oropharynx as per pre-operative assessment

## 2015-04-09 ENCOUNTER — Encounter (HOSPITAL_COMMUNITY): Payer: Self-pay | Admitting: Orthopedic Surgery

## 2015-04-09 MED ORDER — OXYCODONE HCL 5 MG PO TABS: 5.0000 mg | ORAL_TABLET | ORAL | Status: DC | PRN

## 2015-04-09 MED ORDER — METHOCARBAMOL 500 MG PO TABS: 500.0000 mg | ORAL_TABLET | Freq: Three times a day (TID) | ORAL | Status: DC | PRN

## 2015-04-09 MED ORDER — ASPIRIN EC 325 MG PO TBEC: 325.0000 mg | DELAYED_RELEASE_TABLET | Freq: Every day | ORAL | Status: DC

## 2015-04-09 MED ORDER — SENNA 8.6 MG PO TABS: 2.0000 | ORAL_TABLET | Freq: Two times a day (BID) | ORAL | Status: DC

## 2015-04-09 MED ORDER — DOCUSATE SODIUM 100 MG PO CAPS: 100.0000 mg | ORAL_CAPSULE | Freq: Two times a day (BID) | ORAL | Status: DC

## 2015-04-09 MED ORDER — NAPROXEN 500 MG PO TABS: 500.0000 mg | ORAL_TABLET | Freq: Two times a day (BID) | ORAL | Status: AC

## 2015-04-09 MED FILL — Fentanyl Citrate Preservative Free (PF) Inj 100 MCG/2ML: INTRAMUSCULAR | Qty: 2 | Status: AC

## 2015-04-09 NOTE — Anesthesia Postprocedure Evaluation (Signed)
  Anesthesia Post-op Note  Patient: Gina James  Procedure(s) Performed: Procedure(s): OPEN REDUCTION INTERNAL FIXATION (ORIF)  RIGHT TIBIA PILON FRACTURE (Right)  Patient Location: PACU  Anesthesia Type:General and Regional  Level of Consciousness: awake  Airway and Oxygen Therapy: Patient Spontanous Breathing  Post-op Pain: none  Post-op Assessment: Post-op Vital signs reviewed, Patient's Cardiovascular Status Stable, Respiratory Function Stable, Patent Airway, No signs of Nausea or vomiting and Pain level controlled     RLE Motor Response: Responds to commands, Purposeful movement RLE Sensation: Tingling (toes)   R Sensory Level: L2-Upper inner thigh, upper buttock  Post-op Vital Signs: Reviewed and stable  Last Vitals:  Filed Vitals:   04/09/15 0414  BP: 116/65  Pulse: 83  Temp: 37.1 C  Resp: 18    Complications: No apparent anesthesia complications

## 2015-04-09 NOTE — Evaluation (Signed)
Physical Therapy Evaluation and Discharge Patient Details Name: Gina James MRN: [id number] DOB: Dec 29, 1980 Today's Date: 04/09/2015   History of Present Illness  34 y.o. female presented 04/08/2015 for elective ORIF of Right tibial pilon fracture performed by Dr. Doran Durand  Clinical Impression  Patient evaluated by Physical Therapy with no further acute PT needs identified. All education has been completed and the patient has no further questions. Practiced gait training with a rolling walker as she states she does not feel safe with crutches and worries about falling. Mobilizes well with RW for an assistive device without loss of balance. Good family support at home. See below for any follow-up Physial Therapy or equipment needs. PT is signing off. Thank you for this referral.     Follow Up Recommendations No PT follow up;Supervision for mobility/OOB    Equipment Recommendations  3in1 (PT)    Recommendations for Other Services       Precautions / Restrictions Precautions Precautions: Fall Restrictions Weight Bearing Restrictions: Yes RLE Weight Bearing: Non weight bearing      Mobility  Bed Mobility Overal bed mobility: Modified Independent             General bed mobility comments: extra time  Transfers Overall transfer level: Needs assistance Equipment used: Rolling walker (2 wheeled) Transfers: Sit to/from Stand Sit to Stand: Supervision         General transfer comment: Supervision for safety from bed and toilet. VC for hand placement. Good stability upon standing when holding RW for support.  Ambulation/Gait Ambulation/Gait assistance: Supervision Ambulation Distance (Feet): 70 Feet Assistive device: Rolling walker (2 wheeled) Gait Pattern/deviations:  ("hop-to" pattern) Gait velocity: decreased Gait velocity interpretation: Below normal speed for age/gender General Gait Details: Educated on safe DME use with a rolling walker. Pt stated she did not feel  comfortable with crutches at home and worries about falling. Feels much better with RW. No loss of balance. VC for upright gaze and walker placement for proximity. Maintains NWB on RLE at all times  Stairs            Wheelchair Mobility    Modified Rankin (Stroke Patients Only)       Balance Overall balance assessment: Needs assistance (requires RW to stand) Sitting-balance support: No upper extremity supported Sitting balance-Leahy Scale: Normal     Standing balance support: Single extremity supported Standing balance-Leahy Scale: Poor                               Pertinent Vitals/Pain Pain Assessment: 0-10 Pain Score: 7  Pain Location: RLE Pain Descriptors / Indicators: Aching;Sharp Pain Intervention(s): Monitored during session;Repositioned;RN gave pain meds during session    [place] expects to be discharged to:: Private residence Living Arrangements: Spouse/significant other;Children Available Help at Discharge: Family;Available 24 hours/day Type of Home: House Home Access: Level entry     Home Layout: Two level;Able to live on main level with bedroom/bathroom (shower is upstairs) Home Equipment: Gilford Rile - 2 wheels;Crutches      Prior Function Level of Independence: Independent               Hand Dominance   Dominant Hand: Right    Extremity/Trunk Assessment   Upper Extremity Assessment: Defer to OT evaluation           Lower Extremity Assessment: RLE deficits/detail RLE Deficits / Details: Able to move toes, but painfully.  Communication   Communication: No difficulties  Cognition Arousal/Alertness: Awake/alert Behavior During Therapy: WFL for tasks assessed/performed Overall Cognitive Status: Within Functional Limits for tasks assessed                      General Comments General comments (skin integrity, edema, etc.): Husband present, will have good family support at d/c. Only concern  was getting on/off toilet at home. May benefit from [facility] to be fitted over her home commode.    Exercises Other Exercises Other Exercises: toe flexion extension on Rt as tolerated to maintain ROM      Assessment/Plan    PT Assessment Patent does not need any further PT services  PT Diagnosis Difficulty walking;Acute pain   PT Problem List    PT Treatment Interventions     PT Goals (Current goals can be found in the Care Plan section) Acute Rehab PT Goals Patient Stated Goal: None stated PT Goal Formulation: All assessment and education complete, DC therapy    Frequency     Barriers to discharge        Co-evaluation               End of Session Equipment Utilized During Treatment: Gait belt Activity Tolerance: Patient tolerated treatment well Patient left: in bed;with call bell/phone within reach;with family/visitor present Nurse Communication: Mobility status         Time: 0900-0929 PT Time Calculation (min) (ACUTE ONLY): 29 min   Charges:   PT Evaluation $Initial PT Evaluation Tier I: 1 Procedure PT Treatments $Gait Training: 8-22 mins   PT G CodesEllouise Newer 04/09/2015, 9:45 AM Elayne Snare, [place]

## 2015-04-09 NOTE — Op Note (Signed)
NAMEMarland James  Gina, James NO.:  [id number]  MEDICAL RECORD NO.:  [id number]  LOCATION:  6N24C                        FACILITY:  [facility]  PHYSICIAN:  Wylene Simmer, MD        DATE OF BIRTH:  19-Jul-1981  DATE OF PROCEDURE:  04/08/2015 DATE OF DISCHARGE:                              OPERATIVE REPORT   PREOPERATIVE DIAGNOSIS:  Right tibial pilon fracture.  POSTOPERATIVE DIAGNOSIS:  Right tibial pilon fracture.  PROCEDURE: 1. Open reduction and internal fixation of right tibial pilon     fracture. 2. AP, mortise, and lateral radiographs of the right ankle.  SURGEON:  Wylene Simmer, MD  ASSISTANT:  Mechele Claude, PA-C  ANESTHESIA:  General, regional.  ESTIMATED BLOOD LOSS:  Minimal.  TOURNIQUET TIME:  One hour at 250 mmHg.  COMPLICATIONS:  None apparent.  DISPOSITION:  Extubated awake and stable to recovery.  INDICATIONS FOR PROCEDURE:  The patient is a 34 year old female who injured her right ankle in the early morning hours this past Sunday. She was seen in the emergency department where radiographs revealed the tibial pilon fracture.  A CT scan was obtained and she was splinted. She presents today for open reduction and internal fixation versus external fixation and closed reduction depending on the condition of her skin.  She understands the risks and benefits, the alternative treatment options, and elects surgical treatment.  She specifically understands risks of bleeding, infection, nerve damage, blood clots, need for additional surgery, continued pain, posttraumatic arthritis, amputation, and death.  PROCEDURE IN DETAIL:  After preoperative consent was obtained and the correct operative site was identified, the patient was brought to the operating room and placed supine on the operating table.  General anesthesia was induced.  Preoperative antibiotics were administered. Surgical time-out was taken.  The right lower extremity was prepped and draped in  standard sterile fashion with the tourniquet around the thigh. The extremity was exsanguinated and the tourniquet was inflated to 250 mmHg.  Based on careful examination of the skin, decision was made to proceed with open reduction and internal fixation.  Her skin had no evidence of blisters and only mild swelling.  The skin wrinkled appropriately across the proposed operative site.  Longitudinal incision was made over the midline of the anterior ankle. Sharp dissection was carried down through skin and subcutaneous tissue. The extensor retinaculum was incised over the extensor hallucis longus tendon.  It was released proximally and distally.  The interval then between the extensor hallucis longus and tibialis anterior tendons was developed and the neurovascular bundle was identified.  It was protected and retracted out of the way.  Hematoma was irrigated copiously.  The fracture site was identified.  Longitudinal traction and internal rotation were applied and the fracture reduced appropriately.  The fracture was then provisionally pinned with a K-wire and held in place with a pointed tenaculum.  AP and lateral radiographs confirmed appropriate reduction of the fracture.  A narrow 6-hole anterior tibial plate was selected from the [employer] ALPS set.  It was inserted over the anterior aspect of the ankle and provisionally pinned in place. Proximally, a 3.5-mm fully-threaded screw was inserted through the oblong hole.  AP  and lateral radiographs confirmed appropriate position of the plate.  Distally, the central hole and the plate was drilled and a 4-mm partially threaded solid screw was inserted through the posterior malleolus.  This secured the posterior malleolus fracture line appropriately and pulled the plate securely down to the anterior cortex of the bone.  AP and lateral radiographs confirmed appropriate position of the plate.  The remaining holes were drilled and filled with  locking screws and the nonlocking screw was removed and replaced with a locking screw.  The 2 distal tabs were left open and the fast guides were removed.  Proximally, 2nd 3.5-mm fully-threaded cortical screw was inserted through the most proximal oblong hole.  The hole between these was drilled and filled with locking screw.  Final AP, mortise, and lateral radiographs confirmed appropriate position and length of all hardware and appropriate reduction of the fractures.  Wound was irrigated copiously.  Extensor retinaculum was repaired with simple sutures of 0 Vicryl.  Subcutaneous tissues were approximated with Monocryl.  The skin incision was then closed with a running 3-0 nylon. Sterile dressings were applied followed by a well-padded, short-leg splint.  Tourniquet was released at 1 hour after application of the dressings.  The patient was awakened from anesthesia and transported to recovery room in stable condition.  FOLLOWUP PLAN:  The patient will be nonweightbearing on the right lower extremity.  She will start Lovenox for DVT prophylaxis while she is an inpatient.  She will likely be discharged home tomorrow after physical therapy.  RADIOGRAPHS:  AP, mortise, and lateral radiographs of the right ankle were obtained intraoperatively.  These show interval reduction and fixation of the tibial pilon fracture and appropriate position and length of all hardware.  No other acute injuries are noted.  Mechele Claude, PA-C was present and scrubbed for the duration of the case.  His assistance was essential in positioning the patient, prepping and draping, gaining and maintaining exposure, performing the operation, closing and dressing the wounds, and applying the splint.     Wylene Simmer, MD     JH/MEDQ  D:  04/08/2015  T:  04/09/2015  Job:  [id number]

## 2015-04-09 NOTE — Discharge Summary (Signed)
Physician Discharge Summary  Patient ID: Gina James MRN: [id number] DOB/AGE: [birth year] 34 y.o.  Admit date: 04/08/2015 Discharge date: 04/09/2015  Admission Diagnoses: Right tibial pilon fracture; generalized anxiety disorder; obesity; lumbago  Discharge Diagnoses:  Active Problems:   Pilon fracture same as above  Discharged Condition: stable  Hospital Course: Patient presented to the OR on 04/08/2015 for elective ORIF of Right tibial pilon fracture performed by Dr. Doran Durand.  Patient underwent the procedure successfully without complication.  She was then admitted to inpatient care for one midnight.  She tolerated her inpatient stay well without complication.  Planned discharge home today.  Consults: PT/OT, case management  Significant Diagnostic Studies: X-ray  Treatments: antibiotics: vancomycin, analgesia: acetaminophen w/ codeine, Morphine, anticoagulation: lovenox, therapies: PT and OT and surgery: As stated above  Discharge Exam: Blood pressure 116/65, pulse 83, temperature 98.8 F (37.1 C), temperature source Oral, resp. rate 18, height 5\' 4"  (1.626 m), weight 96.843 kg (213 lb 8 oz), last menstrual period 03/12/2015, SpO2 100 %. General: WDWN patient in NAD. Psych:  Appropriate mood and affect. Neuro:  A&O x 3, Moving all extremities, sensation intact to light touch HEENT:  EOMs intact Chest:  Even non-labored respirations Skin:  Dressing C/D/I Extremities: warm/dry, No lymphadenopathy. Pulses: Popliteal 2+ MSK:  ROM: TKE, KF to 100 degrees.  Active DF/PF of toes.  MMT:  Patient can perform quad set in room.   Disposition: 01-Home or Self Care  Discharge Instructions    Call MD / Call 911    Complete by:  As directed   If you experience chest pain or shortness of breath, CALL 911 and be transported to the hospital emergency room.  If you develope a fever above 101 F, pus (white drainage) or increased drainage or redness at the wound, or calf pain, call your surgeon's  office.     Constipation Prevention    Complete by:  As directed   Drink plenty of fluids.  Prune juice may be helpful.  You may use a stool softener, such as Colace (over the counter) 100 mg twice a day.  Use MiraLax (over the counter) for constipation as needed.     Diet - low sodium heart healthy    Complete by:  As directed      Non weight bearing    Complete by:  As directed   Laterality:  right  Extremity:  Lower            Medication List    STOP taking these medications        metaxalone 800 MG tablet  Commonly known as:  SKELAXIN     naproxen sodium 220 MG tablet  Commonly known as:  ANAPROX     oxyCODONE-acetaminophen 5-325 MG per tablet  Commonly known as:  PERCOCET/ROXICET      TAKE these medications        aspirin EC 325 MG tablet  Take 1 tablet (325 mg total) by mouth daily.     docusate sodium 100 MG capsule  Commonly known as:  COLACE  Take 1 capsule (100 mg total) by mouth 2 (two) times daily. While taking narcotic pain medicine.     Fish Oil 1000 MG Caps  Take 1,000 mg by mouth daily.     methocarbamol 500 MG tablet  Commonly known as:  ROBAXIN  Take 1 tablet (500 mg total) by mouth every 8 (eight) hours as needed for muscle spasms (spasms).     multivitamin with minerals tablet  Take 1 tablet by mouth daily.     naproxen 500 MG tablet  Commonly known as:  NAPROSYN  Take 1 tablet (500 mg total) by mouth 2 (two) times daily with a meal.     ondansetron 4 MG disintegrating tablet  Commonly known as:  ZOFRAN ODT  Take 1 tablet (4 mg total) by mouth every 8 (eight) hours as needed for nausea or vomiting.     oxyCODONE 5 MG immediate release tablet  Commonly known as:  ROXICODONE  Take 1-2 tablets (5-10 mg total) by mouth every 4 (four) hours as needed for moderate pain or severe pain.     pyridOXINE 100 MG tablet  Commonly known as:  VITAMIN B-6  Take 1,000 mg by mouth daily.     senna 8.6 MG Tabs tablet  Commonly known as:  SENOKOT   Take 2 tablets (17.2 mg total) by mouth 2 (two) times daily.     venlafaxine XR 150 MG 24 hr capsule  Commonly known as:  EFFEXOR-XR  TAKE ONE CAPSULE BY MOUTH ONCE DAILY           Follow-up Information    Follow up with Heath Badon, Jenny Reichmann, MD. Schedule an appointment as soon as possible for a visit in 2 weeks.   Specialty:  Orthopedic Surgery   Contact information:   [address] [place] [address] [phone]      Patient stayed one more night due to not being able to urinate on her own.  Foley was d/c'd this am and she has urinated since without difficulty.  Signed: Mechele Claude, PA-C, [facility] Office:  [phone]

## 2015-04-09 NOTE — Care Management Note (Addendum)
Case Management Note  Patient Details  Name: Gina James MRN: [id number] Date of Birth: 1981/01/09  Subjective/Objective:                    Action/Plan: DC today   Expected Discharge Date:                  Expected Discharge Plan:  Home/Self Care  In-House Referral:     Discharge planning Services  CM Consult  Post Acute Care Choice:  Durable Medical Equipment Choice offered to:     DME Arranged:  3-N-1, walker DME Agency:  [place]:    [place] Agency:     Status of Service:     Medicare Important Message Given:    Date Medicare IM Given:    Medicare IM give by:    Date Additional Medicare IM Given:    Additional Medicare Important Message give by:     If discussed at [place] [facility] of Stay Meetings, dates discussed:    Additional Comments:  Marilu Favre, RN 04/09/2015, 10:24 AM

## 2015-04-09 NOTE — Progress Notes (Signed)
Subjective: 1 Day Post-Op Procedure(s) (LRB): OPEN REDUCTION INTERNAL FIXATION (ORIF)  RIGHT TIBIA PILON FRACTURE (Right) Patient reports pain as mild.  Pt concerned about h/o urinary retention after surgery.  Objective: Vital signs in last 24 hours: Temp:  [98.3 F (36.8 C)-98.8 F (37.1 C)] 98.8 F (37.1 C) (08/03 0414) Pulse Rate:  [73-94] 83 (08/03 0414) Resp:  [12-22] 18 (08/03 0414) BP: (110-130)/(64-84) 116/65 mmHg (08/03 0414) SpO2:  [97 %-100 %] 100 % (08/03 0414) Weight:  [96.843 kg (213 lb 8 oz)] 96.843 kg (213 lb 8 oz) (08/02 2003)  Intake/Output from previous day: 08/02 0701 - 08/03 0700 In: 2532.5 [P.O.:360; I.V.:1862.5; IV Piggyback:310] Out: 1100 [Urine:1075; Blood:25] Intake/Output this shift:     Recent Labs  04/08/15 1332 04/08/15 2053  HGB 14.0 13.3    Recent Labs  04/08/15 1332 04/08/15 2053  WBC 7.2 7.1  RBC 4.83 4.66  HCT 41.0 39.3  PLT 193 192    Recent Labs  04/08/15 2053  CREATININE 0.79   No results for input(s): LABPT, INR in the last 72 hours.    Assessment/Plan: 1 Day Post-Op Procedure(s) (LRB): OPEN REDUCTION INTERNAL FIXATION (ORIF)  RIGHT TIBIA PILON FRACTURE (Right)  It's medically necessary to keep the patient in the hospital for another night to keep the foley in place.  We'll plan to remove it tomorrow morning and make sure she can void spontaneously before discharge home.  Wylene Simmer 04/09/2015, 2:50 PM

## 2015-04-09 NOTE — Discharge Instructions (Signed)
Gina Simmer, MD [place]  Please read the following information regarding your care after surgery.  Medications  You only need a prescription for the narcotic pain medicine (ex. oxycodone, Percocet, Norco).  All of the other medicines listed below are available over the counter. X acetominophen (Tylenol) 650 mg every 4-6 hours as you need for minor pain X oxycodone as prescribed for moderate to severe pain X robaxin as prescribed as you need for muscle cramps X aleve as prescribed for pain  Narcotic pain medicine (ex. oxycodone, Percocet, Vicodin) will cause constipation.  To prevent this problem, take the following medicines while you are taking any pain medicine. X docusate sodium (Colace) 100 mg twice a day X senna (Senokot) 2 tablets twice a day  X To help prevent blood clots, take an aspirin (325 mg) once a day for a month after surgery.  You should also get up every hour while you are awake to move around.    Weight Bearing X Do not bear any weight on the operated leg or foot.  Cast / Splint / Dressing X Keep your splint or cast clean and dry.  Dont put anything (coat hanger, pencil, etc) down inside of it.  If it gets damp, use a hair dryer on the cool setting to dry it.  If it gets soaked, call the office to schedule an appointment for a cast change.   After your dressing, cast or splint is removed; you may shower, but do not soak or scrub the wound.  Allow the water to run over it, and then gently pat it dry.  Swelling It is normal for you to have swelling where you had surgery.  To reduce swelling and pain, keep your toes above your nose for at least 3 days after surgery.  It may be necessary to keep your foot or leg elevated for several weeks.  If it hurts, it should be elevated.  Follow Up Call my office at [phone] when you are discharged from the hospital or surgery center to schedule an appointment to be seen two weeks after surgery.  Call my office  at [phone] if you develop a fever >101.5 F, nausea, vomiting, bleeding from the surgical site or severe pain.

## 2015-04-10 NOTE — Progress Notes (Signed)
Pt discharged to home.  Discharge instructions explained to pt.  Pt has no questions at the time of discharge.  Pt states she has all belongings.  IV dc'd.  Volunteer services wheeled pt out of hospital.

## 2015-04-29 ENCOUNTER — Telehealth: Payer: Self-pay | Admitting: Family

## 2015-04-29 MED ORDER — VENLAFAXINE HCL ER 150 MG PO CP24: 150.0000 mg | ORAL_CAPSULE | Freq: Every day | ORAL | Status: DC

## 2015-04-29 NOTE — Telephone Encounter (Signed)
Refill sent to pharmacy. Notified pt. 

## 2015-04-29 NOTE — Telephone Encounter (Signed)
Relation to pt: self  Call back number: [phone] Pharmacy: [facility] [place] [phone] (Phone) [phone] (Fax)         Reason for call:  Patient requesting a refill venlafaxine XR (EFFEXOR-XR) 150 MG 24 hr capsule

## 2015-07-17 ENCOUNTER — Ambulatory Visit
Payer: BLUE CROSS/BLUE SHIELD | Attending: Orthopedic Surgery | Admitting: Rehabilitative and Restorative Service Providers"

## 2015-07-17 DIAGNOSIS — R269 Unspecified abnormalities of gait and mobility: Secondary | ICD-10-CM | POA: Diagnosis present

## 2015-07-17 DIAGNOSIS — M623 Immobility syndrome (paraplegic): Secondary | ICD-10-CM | POA: Insufficient documentation

## 2015-07-17 DIAGNOSIS — Z7409 Other reduced mobility: Secondary | ICD-10-CM

## 2015-07-17 DIAGNOSIS — M25571 Pain in right ankle and joints of right foot: Secondary | ICD-10-CM | POA: Diagnosis not present

## 2015-07-17 DIAGNOSIS — R29898 Other symptoms and signs involving the musculoskeletal system: Secondary | ICD-10-CM

## 2015-07-17 DIAGNOSIS — M259 Joint disorder, unspecified: Secondary | ICD-10-CM | POA: Diagnosis present

## 2015-07-17 DIAGNOSIS — M2560 Stiffness of unspecified joint, not elsewhere classified: Secondary | ICD-10-CM

## 2015-07-17 NOTE — Therapy (Signed)
[facility] [address]  [place], [place], [address] Phone: [phone]   Fax:  [phone]  Physical Therapy Evaluation  Patient Details  Name: Gina James MRN: [id number] Date of Birth: 1980/10/10 Referring Provider: Dr. Doran Durand  Encounter Date: 07/17/2015      PT End of Session - 07/17/15 1207    Visit Number 1   Number of Visits 12   Date for PT Re-Evaluation 08/28/15   PT Start Time 1107   PT Stop Time 1206   PT Time Calculation (min) 59 min   Activity Tolerance Patient tolerated treatment well      Past Medical History  Diagnosis Date  . Anxiety   . Post partum depression   . Ankle fracture, right   . Complication of anesthesia     difficulty urinating after anesthesia  . Headache     "monthly" (04/08/2015)  . Arthritis     "in my back" (04/08/2015)  . Chronic lower back pain     Past Surgical History  Procedure Laterality Date  . Laparoscopic abdominal exploration  1997    "to check for endometriosis"  . Cesarean section  2007 and 2009  . Combined abdominoplasty and liposuction  12/2014    "liposuction in thighs"  . Tibia fracture surgery Right 04/08/2015    ORIF tibial pilon  . Fracture surgery    . Laparoscopic cholecystectomy  2007  . Open reduction internal fixation (orif) tibia/fibula fracture Right 04/08/2015    Procedure: OPEN REDUCTION INTERNAL FIXATION (ORIF)  RIGHT TIBIA PILON FRACTURE;  Surgeon: Wylene Simmer, MD;  Location: [facility];  Service: Orthopedics;  Laterality: Right;    There were no vitals filed for this visit.  Visit Diagnosis:  Pain, ankle, right - Plan: PT plan of care cert/re-cert  Ankle weakness - Plan: PT plan of care cert/re-cert  Stiffness due to immobility - Plan: PT plan of care cert/re-cert  Abnormal gait - Plan: PT plan of care cert/re-cert      Subjective Assessment - 07/17/15 1110    Subjective Pt reports that she fractured Rt ankle 04/06/15 when she was  iin wedge heels and fell twisting ankle. ORIF 08/02/016. Continues to have stiffness and pain with walking or standing    Pertinent History LBP and Rt knee dyfucntion including torn meniscus and ACL sprian   How long can you sit comfortably? no limit   How long can you stand comfortably? 30 min    How long can you walk comfortably? 2-3 min    Diagnostic tests xrays   Patient Stated Goals be able walk and not have to stop every few minutes;  to hike and kick box; return to normal activities    Currently in Pain? Yes   Pain Score 4    Pain Location Ankle   Pain Orientation Right   Pain Descriptors / Indicators Aching  stiff   Pain Type Acute pain   Pain Onset More than a month ago   Pain Frequency Intermittent   Aggravating Factors  walking; sitting with foot flexed    Pain Relieving Factors avoiding activities; staying off feet             [facility] PT Assessment - 07/17/15 0001    Assessment   Medical Diagnosis Rt ankle fx ORIF 04/08/15   Referring Provider Dr. Doran Durand   Onset Date/Surgical Date 04/08/15   Hand Dominance Right   Next MD Visit 07/21/15   Prior Therapy  none   Precautions   Precaution Comments no jogging; jumping; no treadmill   Balance Screen   Has the patient fallen in the past 6 months Yes   How many times? 1   Has the patient had a decrease in activity level because of a fear of falling?  Yes   Is the patient reluctant to leave their home because of a fear of falling?  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   Leisure gym 5-7 days/wk machines aerobic    Observation/Other Assessments   Focus on Therapeutic Outcomes (FOTO)  63% limitaton    Observation/Other Assessments-Edema    Edema --  mild/mod edema distal/medial leg and into dorsum of foot Rt   Sensation    Additional Comments numb feeling to touch dorsumm Rt foot    Posture/Postural Control   Posture Comments hyperextesion of bilat knees    AROM   Right/Left Hip --  WFL's bilat   Right/Left Knee --  WFL's bilat   Right Ankle Dorsiflexion 0   Right Ankle Plantar Flexion 68   Right Ankle Inversion 22   Right Ankle Eversion 16   Left Ankle Dorsiflexion 7   Left Ankle Plantar Flexion 90   Left Ankle Inversion 30   Left Ankle Eversion 21   Strength   Strength Assessment Site --  5/5 bilat hips/kees except Rt hip ext 5-/5   Right Ankle Dorsiflexion 4+/5   Right Ankle Plantar Flexion 3+/5  UE one hand for balance heel raise x2   Right Ankle Inversion 4+/5   Right Ankle Eversion 4/5   Left Ankle Dorsiflexion 5/5   Left Ankle Plantar Flexion 5/5   Left Ankle Inversion 5/5   Left Ankle Eversion 5/5   Flexibility   Hamstrings Rt 90 deg/Lt 95 deg    Quadriceps Rt heel to buttock 7 in; Lt 5 in    ITB tight Rt   Piriformis mild tightness Rt    Palpation   Palpation comment hypersensitive to touch medial distal tibia area and through incision area    Functional Gait  Assessment   Gait assessed  --  antalgic gait; decreased wt bearing Rt LE poor mechanics                   [facility] Adult PT Treatment/Exercise - 07/17/15 0001    Cryotherapy   Number Minutes Cryotherapy --   Cryotherapy Location --   Vasopneumatic   Number Minutes Vasopneumatic  15 minutes   Vasopnuematic Location  Ankle   Vasopneumatic Pressure Medium   Vasopneumatic Temperature  3*   Manual Therapy   Edema Management retrograde massage    Soft tissue mobilization scar massage    Ankle Exercises: Stretches   Soleus Stretch 3 reps;30 seconds   Gastroc Stretch 3 reps;30 seconds   Ankle Exercises: Standing   Other Standing Ankle Exercises shallow knee bends x5   Ankle Exercises: Seated   Ankle Circles/Pumps Right;10 reps;AROM   Ankle Circles/Pumps Limitations pumps/circles CW/CCW   Ankle Exercises:  Supine   Other Supine Ankle Exercises hamstring stretch 30 sec x3   Other Supine Ankle Exercises quad stretch prone 30 sec x3                 PT Education - 07/17/15  1202    Education provided Yes   Education Details HEP; desentization    Person(s) Educated Patient   Methods Explanation;Demonstration;Tactile cues;Verbal cues;Handout   Comprehension Verbalized understanding;Returned demonstration;Verbal cues required;Tactile cues required             PT Long Term Goals - 07/17/15 1210    PT LONG TERM GOAL #1   Title Increase AROM Rt ankle DF/inv/eversiion to equal to Lt ankle 08/28/15   Time 6   Period Weeks   Status New   PT LONG TERM GOAL #2   Title 5-/5 to 5/5 strehgth Rt ankle 08/28/15   Time 6   Period Weeks   Status New   PT LONG TERM GOAL #3   Title Normal gait pattern with equal weight bearing Rt/Lt LE's 08/28/15   Time 6   Status New   PT LONG TERM GOAL #4   Title Walk for 15-30 min with min to no limitations 08/28/15   Time 6   Period Weeks   Status New   PT LONG TERM GOAL #5   Title Improve FOTO to </=42% limitation 08/28/15   Time 6   Period Weeks   Status New               Plan - 07/17/15 1207    Clinical Impression Statement Patient presents s/p ORIF for fx Rt tibia with pain; decreased ROM; decreased strength; abnormal gait; limited funcitonal activity level. She will benefit from PT to address limitations identified.    Pt will benefit from skilled therapeutic intervention in order to improve on the following deficits Abnormal gait;Pain;Decreased range of motion;Decreased mobility;Decreased strength;Increased fascial restricitons;Decreased activity tolerance;Decreased endurance   Rehab Potential Good   PT Frequency 2x / week   PT Duration 6 weeks   PT Treatment/Interventions Patient/family education;ADLs/Self Care Home Management;Therapeutic exercise;Therapeutic activities;Cryotherapy;Electrical Stimulation;Moist  Heat;Ultrasound;Neuromuscular re-education;Dry needling;Manual techniques   PT Next Visit Plan cont stretching; progress with strengthening exercises   PT Home Exercise Plan HEP   Consulted and Agree with Plan of Care Patient         Problem List Patient Active Problem List   Diagnosis Date Noted  . Pilon fracture 04/08/2015  . Skin lesion 11/19/2014  . Preventative health care 10/29/2014  . Obesity 06/10/2014  . Chronic meniscal tear of knee 11/23/2013  . Lumbar pain with radiation down both legs 09/11/2013  . Generalized anxiety disorder 06/18/2013    Gabrial Domine Nilda Simmer PT, MPH 07/17/2015, 12:24 PM  [facility] [address]  [place], [place], [address] Phone: [phone]   Fax:  [phone]  Name: MANERVIA MUSCO MRN: [id number] Date of Birth: 1980/12/06

## 2015-07-17 NOTE — Patient Instructions (Signed)
Desentization to inside of right ankle and leg  Scar massage - 2-5 min twice/day   Gastroc Stretch    Stand with right foot back, leg straight, forward leg bent. Keeping heel on floor, turned slightly out, lean into wall until stretch is felt in calf. Hold __30__ seconds. Repeat __3__ times per set. Do _3-4___ sets per day.   Soleus Stretch    Stand with right foot back, both knees bent. Keeping heel on floor, turned slightly out, lean into wall until stretch is felt in lower calf. Hold _30___ seconds. Repeat __3__ times per set. Do 3-4 sessions per day.  \ Stretching: Hamstring (Supine)    Supporting right thigh behind knee, slowly straighten knee until stretch is felt in back of thigh. Hold _30___ seconds. Repeat _3___ times per set. Do _1-2___ sets per day.    Quads / HF, Prone    Lie face down, knees together. Grasp one ankle with same-side hand. Use towel if needed to reach. Gently pull foot toward buttock. Hold _30__ seconds. Repeat _3__ times per session. Do __1-2_ sessions per day. Can use strap to help with stretch  Ankle Pumps    Lie with knees supported on bolster or sit. Straighten one knee. Keep knee straight; alternately press out and pull up heel. Emphasize movement of heel. Repeat __20-30_ times. 3-4 times/day  Ankle Circles    Slowly rotate right foot and ankle clockwise then counterclockwise. Gradually increase range of motion. Avoid pain. Circle __10-20__ times each direction per set. Do 3-4 sessions per day.

## 2015-07-29 ENCOUNTER — Ambulatory Visit (INDEPENDENT_AMBULATORY_CARE_PROVIDER_SITE_OTHER): Payer: BLUE CROSS/BLUE SHIELD | Admitting: Family

## 2015-07-29 ENCOUNTER — Encounter: Payer: Self-pay | Admitting: Family

## 2015-07-29 VITALS — BP 110/67 | HR 64 | Temp 98.1°F | Resp 16 | Ht 64.5 in | Wt 217.6 lb

## 2015-07-29 DIAGNOSIS — M25561 Pain in right knee: Secondary | ICD-10-CM

## 2015-07-29 DIAGNOSIS — Z30011 Encounter for initial prescription of contraceptive pills: Secondary | ICD-10-CM

## 2015-07-29 DIAGNOSIS — Z23 Encounter for immunization: Secondary | ICD-10-CM | POA: Diagnosis not present

## 2015-07-29 DIAGNOSIS — F3281 Premenstrual dysphoric disorder: Secondary | ICD-10-CM | POA: Insufficient documentation

## 2015-07-29 MED ORDER — VENLAFAXINE HCL ER 150 MG PO CP24: 150.0000 mg | ORAL_CAPSULE | Freq: Every day | ORAL | Status: DC

## 2015-07-29 MED ORDER — LEVONORGEST-ETH ESTRAD 91-DAY 0.15-0.03 MG PO TABS: 1.0000 | ORAL_TABLET | Freq: Every day | ORAL | Status: DC

## 2015-07-29 NOTE — Assessment & Plan Note (Signed)
Advised pt to follow up with Dr. Annette Stable and discuss possibility of repeat MRI with him since he is managing her low back pain and spinal injections.

## 2015-07-29 NOTE — Assessment & Plan Note (Signed)
Discussed addition of OCP (LMP 07/04/15).  She would like to try ocp. Advised pt to go to the ER if SI/HI occurs.

## 2015-07-29 NOTE — Progress Notes (Signed)
Pre visit review using our clinic review tool, if applicable. No additional management support is needed unless otherwise documented below in the visit note. 

## 2015-07-29 NOTE — Patient Instructions (Signed)
Please complete lab work prior to leaving. Start seasonale (birth control). Follow back up with Dr. Trenton Gammon [id number] pain and possible MRI. You will be contacted about your referral to orthopedics for your right knee pain.

## 2015-07-29 NOTE — Progress Notes (Signed)
Subjective:    Patient ID: Gina James, female    DOB: 08-11-1981, 34 y.o.   MRN: [id number]  HPI  [name] is a 34 yr old female who presents today with two concerns:  1) Back pain- reports hx of low back pain since 2010. She is undergoing steroid injections with Dr. Annette Stable, these are helping.    2) Anger- Pt reports feelings of anger, short temper. These symptoms are worse around the time of her menstrual cycle.  (1 week before) continues effexor.  Husband gives example of patient getting angry that dog went to bathroom on carpet and beginning to cry and throw things.  Pt denies current SI/HI. LMP 07/04/15.  Husband had vasectomy.  3) R knee pain- had injury 2013.  (meninscus tear and acl tear) Did PT, symptoms improved. Then worsened.   Review of Systems See HPI  Past Medical History  Diagnosis Date  . Anxiety   . Post partum depression   . Ankle fracture, right   . Complication of anesthesia     difficulty urinating after anesthesia  . Headache     "monthly" (04/08/2015)  . Arthritis     "in my back" (04/08/2015)  . Chronic lower back pain     Social History   Social History  . Marital Status: Married    Spouse Name: N/A  . Number of Children: N/A  . Years of Education: N/A   Occupational History  . Not on file.   Social History Main Topics  . Smoking status: Former Smoker -- 0.12 packs/day for 10 years    Types: Cigarettes    Quit date: 09/06/2004  . Smokeless tobacco: Never Used  . Alcohol Use: 2.4 oz/week    4 Cans of beer per week     Comment: 04/08/2015 "drink only the weekends"  . Drug Use: No  . Sexual Activity: Yes     Comment: vasectomy in partner   Other Topics Concern  . Not on file   Social History Narrative   2 children- son 2007- Gina James,  Gina James 2009   Homemaker   Married   Completed tech school   Enjoys shopping, spending time with friends, reading.           Past Surgical History  Procedure Laterality Date  . Laparoscopic  abdominal exploration  1997    "to check for endometriosis"  . Cesarean section  2007 and 2009  . Combined abdominoplasty and liposuction  12/2014    "liposuction in thighs"  . Tibia fracture surgery Right 04/08/2015    ORIF tibial pilon  . Fracture surgery    . Laparoscopic cholecystectomy  2007  . Open reduction internal fixation (orif) tibia/fibula fracture Right 04/08/2015    Procedure: OPEN REDUCTION INTERNAL FIXATION (ORIF)  RIGHT TIBIA PILON FRACTURE;  Surgeon: Wylene Simmer, MD;  Location: [facility];  Service: Orthopedics;  Laterality: Right;    Family History  Problem Relation Age of Onset  . Diabetes Father   . Cancer Sister 30    breast, had chemo/double mastectomy  . Heart disease Maternal Grandfather   . Heart disease Paternal Grandfather     Allergies  Allergen Reactions  . Amoxicillin Hives  . Ceclor [Cefaclor] Hives  . Penicillins Hives  . Phenergan [Promethazine Hcl] Hives  . Sulfa Antibiotics Hives  . Zithromax [Azithromycin] Hives    Current Outpatient Prescriptions on File Prior to Visit  Medication Sig Dispense Refill  . methocarbamol (ROBAXIN) 500 MG tablet Take 1  tablet (500 mg total) by mouth every 8 (eight) hours as needed for muscle spasms (spasms). 30 tablet 0  . Multiple Vitamins-Minerals (MULTIVITAMIN WITH MINERALS) tablet Take 1 tablet by mouth daily.    . naproxen (NAPROSYN) 500 MG tablet Take 1 tablet (500 mg total) by mouth 2 (two) times daily with a meal. 60 tablet 2  . Omega-3 Fatty Acids (FISH OIL) 1000 MG CAPS Take 1,000 mg by mouth daily.    Marland Kitchen pyridOXINE (VITAMIN B-6) 100 MG tablet Take 1,000 mg by mouth daily.    Marland Kitchen venlafaxine XR (EFFEXOR-XR) 150 MG 24 hr capsule Take 1 capsule (150 mg total) by mouth daily. 30 capsule 2   No current facility-administered medications on file prior to visit.    BP 110/67 mmHg  Pulse 64  Temp(Src) 98.1 F (36.7 C) (Oral)  Resp 16  Ht 5' 4.5" (1.638 m)  Wt 217 lb 9.6 oz (98.703 kg)  BMI 36.79 kg/m2  SpO2  97%       Objective:   Physical Exam  Constitutional: She is oriented to person, place, and time. She appears well-developed and well-nourished.  HENT:  Head: Normocephalic and atraumatic.  Cardiovascular: Normal rate, regular rhythm and normal heart sounds.   No murmur heard. Pulmonary/Chest: Effort normal and breath sounds normal. No respiratory distress. She has no wheezes.  Musculoskeletal: She exhibits no edema.  Mild swelling right knee  Neurological: She is alert and oriented to person, place, and time.  Skin: Skin is warm and dry.  Psychiatric: She has a normal mood and affect. Her behavior is normal. Judgment and thought content normal.          Assessment & Plan:  R Knee pain- hx of ACL tear and meniscal tear. Will refer to ortho for further evaluation and treatment.

## 2015-07-30 LAB — PREGNANCY, URINE: PREG TEST UR: NEGATIVE

## 2015-08-05 ENCOUNTER — Ambulatory Visit: Payer: BLUE CROSS/BLUE SHIELD | Admitting: Physical Therapy

## 2015-08-05 DIAGNOSIS — M25571 Pain in right ankle and joints of right foot: Secondary | ICD-10-CM | POA: Diagnosis not present

## 2015-08-05 DIAGNOSIS — M2560 Stiffness of unspecified joint, not elsewhere classified: Secondary | ICD-10-CM

## 2015-08-05 DIAGNOSIS — Z7409 Other reduced mobility: Secondary | ICD-10-CM

## 2015-08-05 DIAGNOSIS — R269 Unspecified abnormalities of gait and mobility: Secondary | ICD-10-CM

## 2015-08-05 DIAGNOSIS — R29898 Other symptoms and signs involving the musculoskeletal system: Secondary | ICD-10-CM

## 2015-08-05 NOTE — Therapy (Signed)
[facility] [address]  [place], [place], [address] Phone: [phone]   Fax:  [phone]  Physical Therapy Treatment  Patient Details  Name: Gina James MRN: [id number] Date of Birth: Jul 07, 1981 Referring Provider: Dr. Doran Durand  Encounter Date: 08/05/2015      PT End of Session - 08/05/15 0939    Visit Number 2   Number of Visits 12   Date for PT Re-Evaluation 08/28/15   PT Start Time 0933   PT Stop Time 1030   PT Time Calculation (min) 57 min   Activity Tolerance Patient tolerated treatment well   Behavior During Therapy [name] for tasks assessed/performed      Past Medical History  Diagnosis Date  . Anxiety   . Post partum depression   . Ankle fracture, right   . Complication of anesthesia     difficulty urinating after anesthesia  . Headache     "monthly" (04/08/2015)  . Arthritis     "in my back" (04/08/2015)  . Chronic lower back pain     Past Surgical History  Procedure Laterality Date  . Laparoscopic abdominal exploration  1997    "to check for endometriosis"  . Cesarean section  2007 and 2009  . Combined abdominoplasty and liposuction  12/2014    "liposuction in thighs"  . Tibia fracture surgery Right 04/08/2015    ORIF tibial pilon  . Fracture surgery    . Laparoscopic cholecystectomy  2007  . Open reduction internal fixation (orif) tibia/fibula fracture Right 04/08/2015    Procedure: OPEN REDUCTION INTERNAL FIXATION (ORIF)  RIGHT TIBIA PILON FRACTURE;  Surgeon: Wylene Simmer, MD;  Location: [facility];  Service: Orthopedics;  Laterality: Right;    There were no vitals filed for this visit.  Visit Diagnosis:  Pain, ankle, right  Ankle weakness  Stiffness due to immobility  Abnormal gait      Subjective Assessment - 08/05/15 0936    Subjective Patient reports pain has been averaging 2/10 but seems worse today. 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   Currently in Pain? Yes   Pain Score 4   Least & Avg 2/10, Worst 6/10   Pain Location Ankle   Pain Orientation Right;Medial   Pain Descriptors / Indicators Aching  "feels like a major bruise"          TODAY'S TREATMENT  TherEx HEP review:   Bil Standing gastroc & soleus stretches 3x30"   Bil Supine Hamstring stretch 3x30"   Bil Prone quad stretch 3x30"   Rt ankle circles CW/CCW x10 Rt ankle PF/DF/IV/EV with green TB 10x3"  Vasopneumatic compression to Rt ankle, medium pressure, lowest temp x15'           PT Education - 08/05/15 1207    Education provided Yes   Education Details HEP update - 4 way ankle with green TB   Person(s) Educated Patient   Methods Explanation;Demonstration;Handout   Comprehension Verbalized understanding;Returned demonstration;Need further instruction             PT Long Term Goals - 08/05/15 1208    PT LONG TERM GOAL #1   Title Increase AROM Rt ankle DF/inv/eversiion to equal to Lt ankle 08/28/15   Status On-going   PT LONG TERM GOAL #2   Title 5-/5 to 5/5 strehgth Rt ankle 08/28/15   Status On-going   PT LONG TERM GOAL #3   Title Normal  gait pattern with equal weight bearing Rt/Lt LE's 08/28/15   Status On-going   PT LONG TERM GOAL #4   Title Walk for 15-30 min with min to no limitations 08/28/15   Status On-going   PT LONG TERM GOAL #5   Title Improve FOTO to </=42% limitation 08/28/15   Status On-going               Plan - 08/05/15 1159    Clinical Impression Statement Patient reporting good compliance with initial HEP stretches but typically only completing ROM exercises 1x/day. Improved flexibility noted during stretches. Introduced 4 way ankle strengthening with green theraband with c/o muscle fatigue but not increased pain, therefore added to HEP. Will plan to progress strengthening in weight bearing as well as initiate stability training at next visit.   PT Next Visit Plan Review HEP update - TB exercises; Rt  LE strengthening, stability training   Consulted and Agree with Plan of Care Patient        Problem List Patient Active Problem List   Diagnosis Date Noted  . PMDD (premenstrual dysphoric disorder) 07/29/2015  . Pilon fracture 04/08/2015  . Skin lesion 11/19/2014  . Preventative health care 10/29/2014  . Obesity 06/10/2014  . Chronic meniscal tear of knee 11/23/2013  . Lumbar pain with radiation down both legs 09/11/2013  . Generalized anxiety disorder 06/18/2013    Percival Spanish, PT, MPT 08/05/2015, 12:09 PM  [facility] [address]  [place], [place], [address] Phone: [phone]   Fax:  [phone]  Name: RENAE PUYEAR MRN: [id number] Date of Birth: 1981/06/02

## 2015-08-08 ENCOUNTER — Ambulatory Visit: Payer: BLUE CROSS/BLUE SHIELD | Attending: Orthopedic Surgery | Admitting: Physical Therapy

## 2015-08-08 DIAGNOSIS — M2560 Stiffness of unspecified joint, not elsewhere classified: Secondary | ICD-10-CM

## 2015-08-08 DIAGNOSIS — M623 Immobility syndrome (paraplegic): Secondary | ICD-10-CM | POA: Insufficient documentation

## 2015-08-08 DIAGNOSIS — M25571 Pain in right ankle and joints of right foot: Secondary | ICD-10-CM | POA: Insufficient documentation

## 2015-08-08 DIAGNOSIS — Z7409 Other reduced mobility: Secondary | ICD-10-CM

## 2015-08-08 DIAGNOSIS — R269 Unspecified abnormalities of gait and mobility: Secondary | ICD-10-CM

## 2015-08-08 DIAGNOSIS — R29898 Other symptoms and signs involving the musculoskeletal system: Secondary | ICD-10-CM

## 2015-08-08 DIAGNOSIS — M259 Joint disorder, unspecified: Secondary | ICD-10-CM | POA: Insufficient documentation

## 2015-08-08 NOTE — Therapy (Signed)
[facility] [address]  [place], [place], [address] Phone: [phone]   Fax:  [phone]  Physical Therapy Treatment  Patient Details  Name: CHARDONAE OLLER MRN: [id number] Date of Birth: 07/21/1981 Referring Provider: Dr. Doran Durand  Encounter Date: 08/08/2015      PT End of Session - 08/08/15 0951    Visit Number 3   Number of Visits 12   Date for PT Re-Evaluation 08/28/15   PT Start Time 0938   PT Stop Time 1032   PT Time Calculation (min) 54 min   Activity Tolerance Patient tolerated treatment well   Behavior During Therapy [facility] for tasks assessed/performed      Past Medical History  Diagnosis Date  . Anxiety   . Post partum depression   . Ankle fracture, right   . Complication of anesthesia     difficulty urinating after anesthesia  . Headache     "monthly" (04/08/2015)  . Arthritis     "in my back" (04/08/2015)  . Chronic lower back pain     Past Surgical History  Procedure Laterality Date  . Laparoscopic abdominal exploration  1997    "to check for endometriosis"  . Cesarean section  2007 and 2009  . Combined abdominoplasty and liposuction  12/2014    "liposuction in thighs"  . Tibia fracture surgery Right 04/08/2015    ORIF tibial pilon  . Fracture surgery    . Laparoscopic cholecystectomy  2007  . Open reduction internal fixation (orif) tibia/fibula fracture Right 04/08/2015    Procedure: OPEN REDUCTION INTERNAL FIXATION (ORIF)  RIGHT TIBIA PILON FRACTURE;  Surgeon: Wylene Simmer, MD;  Location: [facility];  Service: Orthopedics;  Laterality: Right;    There were no vitals filed for this visit.  Visit Diagnosis:  Pain, ankle, right  Ankle weakness  Stiffness due to immobility  Abnormal gait      Subjective Assessment - 08/08/15 0945    Subjective Patient states she feels like she is walking better. Pain improved with more stiffness than pain today. Patient reports she will have to have  arthroscopy on her right knee at some point this month (date not set). Had previously torn her meniscus in 2013 but able to rehab knee with injections and PT and had remained painfree until fall when ankle fracture occurred.   Currently in Pain? Yes   Pain Score 2    Pain Location Ankle   Pain Orientation Right;Lateral   Pain Descriptors / Indicators --  Stiffness           TODAY'S TREATMENT  TherEx Nustep lvl 4 x 5' Rt ankle Prostretch DF stretch 3x20" each with knee straight and flexed Rt ankle PF/DF/IV/EV with green TB 10x3" Heel raises at back of UBE - DL 10x3", SL attempted but deferred d/t pain on dorsum of foot Marching in place on blue foam x20 with light 1 pole A Heel/toe wight shift on blue foam x20 with light 1 pole A SLS on blue foam with 1 pole A 3x15" Manual rhythmic stabilization all directions 3x30"  Vasopneumatic compression to Rt ankle, medium pressure, lowest temp x15' to reduce pain and swelling            PT Long Term Goals - 08/05/15 1208    PT LONG TERM GOAL #1   Title Increase AROM Rt ankle DF/inv/eversiion to equal to Lt ankle 08/28/15   Status On-going   PT LONG TERM GOAL #2  Title 5-/5 to 5/5 strehgth Rt ankle 08/28/15   Status On-going   PT LONG TERM GOAL #3   Title Normal gait pattern with equal weight bearing Rt/Lt LE's 08/28/15   Status On-going   PT LONG TERM GOAL #4   Title Walk for 15-30 min with min to no limitations 08/28/15   Status On-going   PT LONG TERM GOAL #5   Title Improve FOTO to </=42% limitation 08/28/15   Status On-going               Plan - 08/08/15 1019    Clinical Impression Statement Improving gait with more symmetrical gait pattern and decreased pain, just stiffness. Tolerated progression to standing CKC and stability with mild fatigue but no pain other than when attempting SLS heel raises.   PT Next Visit Plan Rt LE strengthening, stability training   Consulted and Agree with Plan of Care Patient         Problem List Patient Active Problem List   Diagnosis Date Noted  . PMDD (premenstrual dysphoric disorder) 07/29/2015  . Pilon fracture 04/08/2015  . Skin lesion 11/19/2014  . Preventative health care 10/29/2014  . Obesity 06/10/2014  . Chronic meniscal tear of knee 11/23/2013  . Lumbar pain with radiation down both legs 09/11/2013  . Generalized anxiety disorder 06/18/2013    Percival Spanish, PT, MPT 08/08/2015, 11:05 AM  [facility] [address]  [place], [place], [address] Phone: [phone]   Fax:  [phone]  Name: ZYANN DRENNEN MRN: [id number] Date of Birth: 1981-05-09

## 2015-08-12 ENCOUNTER — Ambulatory Visit: Payer: BLUE CROSS/BLUE SHIELD | Admitting: Physical Therapy

## 2015-08-12 DIAGNOSIS — M25571 Pain in right ankle and joints of right foot: Secondary | ICD-10-CM

## 2015-08-12 DIAGNOSIS — Z7409 Other reduced mobility: Secondary | ICD-10-CM

## 2015-08-12 DIAGNOSIS — M2560 Stiffness of unspecified joint, not elsewhere classified: Secondary | ICD-10-CM

## 2015-08-12 DIAGNOSIS — R269 Unspecified abnormalities of gait and mobility: Secondary | ICD-10-CM

## 2015-08-12 DIAGNOSIS — R29898 Other symptoms and signs involving the musculoskeletal system: Secondary | ICD-10-CM

## 2015-08-12 NOTE — Therapy (Signed)
[facility] [address]  [place], [place], [address] Phone: [phone]   Fax:  [phone]  Physical Therapy Treatment  Patient Details  Name: Gina James MRN: [id number] Date of Birth: May 10, 1981 Referring Provider: Dr. Doran Durand  Encounter Date: 08/12/2015      PT End of Session - 08/12/15 0945    Visit Number 4   Number of Visits 12   Date for PT Re-Evaluation 08/28/15   PT Start Time 0938   PT Stop Time 1038   PT Time Calculation (min) 60 min   Activity Tolerance Patient tolerated treatment well   Behavior During Therapy [facility] for tasks assessed/performed      Past Medical History  Diagnosis Date  . Anxiety   . Post partum depression   . Ankle fracture, right   . Complication of anesthesia     difficulty urinating after anesthesia  . Headache     "monthly" (04/08/2015)  . Arthritis     "in my back" (04/08/2015)  . Chronic lower back pain     Past Surgical History  Procedure Laterality Date  . Laparoscopic abdominal exploration  1997    "to check for endometriosis"  . Cesarean section  2007 and 2009  . Combined abdominoplasty and liposuction  12/2014    "liposuction in thighs"  . Tibia fracture surgery Right 04/08/2015    ORIF tibial pilon  . Fracture surgery    . Laparoscopic cholecystectomy  2007  . Open reduction internal fixation (orif) tibia/fibula fracture Right 04/08/2015    Procedure: OPEN REDUCTION INTERNAL FIXATION (ORIF)  RIGHT TIBIA PILON FRACTURE;  Surgeon: Wylene Simmer, MD;  Location: [facility];  Service: Orthopedics;  Laterality: Right;    There were no vitals filed for this visit.  Visit Diagnosis:  Pain, ankle, right  Ankle weakness  Stiffness due to immobility  Abnormal gait      Subjective Assessment - 08/12/15 0942    Subjective Patient report pain is worse and ankle is more stiff today, making it harder to walk normal. Thinks it may be the weather.   Currently in Pain? Yes    Pain Score 5    Pain Location Ankle   Pain Orientation Right;Medial            [facility] PT Assessment - 08/12/15 0938    Assessment   Next MD Visit 08/18/15           TODAY'S TREATMENT  TherEx Nustep lvl 4 x 5' Rt ankle Prostretch DF stretch 3x20" each with knee straight and flexed Heel raises at back of UBE - DL raise & Rt SL eccentric lowering 12x3"  Marching in place on mini-tramp x20 with light UE support Heel/toe wight shift on mini-tramp x20 with light UE support SLS on mini-tramp 3x20" with intermittent brief light UE support Tandem stance on mini-tramp 2x20" each with alternating forward foot  Toe walking x 90 ft Heel walking x 45 ft Standing on blue foam with step-touch to 8" step x20 Seated BAPS L3 - PF/DF, IV/EV, CW/CCW x10 each  Manual rhythmic stabilization all directions 3x30"  Vasopneumatic compression to Rt ankle, medium pressure, lowest temp x15' to reduce pain and swelling            PT Long Term Goals - 08/05/15 1208    PT LONG TERM GOAL #1   Title Increase AROM Rt ankle DF/inv/eversiion to equal to Lt ankle 08/28/15   Status On-going   PT  LONG TERM GOAL #2   Title 5-/5 to 5/5 strehgth Rt ankle 08/28/15   Status On-going   PT LONG TERM GOAL #3   Title Normal gait pattern with equal weight bearing Rt/Lt LE's 08/28/15   Status On-going   PT LONG TERM GOAL #4   Title Walk for 15-30 min with min to no limitations 08/28/15   Status On-going   PT LONG TERM GOAL #5   Title Improve FOTO to </=42% limitation 08/28/15   Status On-going               Plan - 08/12/15 1252    Clinical Impression Statement Increased stiffness/discomfort today which patient associates with the inclement weather but able to tolerate all exercises without increased pain. Mild difficulty isolating movement patterns on BAPS but improved with increased repetitions. Will return to MD next week, therefore will reassess ROM and strength at next visit.   PT Next  Visit Plan Rt LE strengthening, stability training, ROM/MMT check, MD note   Consulted and Agree with Plan of Care Patient        Problem List Patient Active Problem List   Diagnosis Date Noted  . PMDD (premenstrual dysphoric disorder) 07/29/2015  . Pilon fracture 04/08/2015  . Skin lesion 11/19/2014  . Preventative health care 10/29/2014  . Obesity 06/10/2014  . Chronic meniscal tear of knee 11/23/2013  . Lumbar pain with radiation down both legs 09/11/2013  . Generalized anxiety disorder 06/18/2013    Percival Spanish, PT, MPT 08/12/2015, 12:57 PM  [facility] [address]  [place], [place], [address] Phone: [phone]   Fax:  [phone]  Name: Gina James MRN: [id number] Date of Birth: 01-26-1981

## 2015-08-13 ENCOUNTER — Encounter: Payer: Self-pay | Admitting: Family

## 2015-08-13 DIAGNOSIS — M25569 Pain in unspecified knee: Secondary | ICD-10-CM

## 2015-08-15 ENCOUNTER — Ambulatory Visit: Payer: BLUE CROSS/BLUE SHIELD | Admitting: Physical Therapy

## 2015-08-19 ENCOUNTER — Ambulatory Visit: Payer: BLUE CROSS/BLUE SHIELD | Admitting: Physical Therapy

## 2015-08-19 DIAGNOSIS — M25571 Pain in right ankle and joints of right foot: Secondary | ICD-10-CM | POA: Diagnosis not present

## 2015-08-19 DIAGNOSIS — R29898 Other symptoms and signs involving the musculoskeletal system: Secondary | ICD-10-CM

## 2015-08-19 DIAGNOSIS — M2560 Stiffness of unspecified joint, not elsewhere classified: Secondary | ICD-10-CM

## 2015-08-19 DIAGNOSIS — Z7409 Other reduced mobility: Secondary | ICD-10-CM

## 2015-08-19 DIAGNOSIS — R269 Unspecified abnormalities of gait and mobility: Secondary | ICD-10-CM

## 2015-08-19 NOTE — Therapy (Signed)
[facility] [address]  [place], [place], [address] Phone: [phone]   Fax:  [phone]  Physical Therapy Treatment  Patient Details  Name: Gina James MRN: [id number] Date of Birth: 04/07/1981 Referring Provider: Dr. Doran Durand  Encounter Date: 08/19/2015      PT End of Session - 08/19/15 0944    Visit Number 5   Number of Visits 12   Date for PT Re-Evaluation 09/16/15   PT Start Time 0935   PT Stop Time 1019   PT Time Calculation (min) 44 min   Activity Tolerance Patient tolerated treatment well   Behavior During Therapy [facility] for tasks assessed/performed      Past Medical History  Diagnosis Date  . Anxiety   . Post partum depression   . Ankle fracture, right   . Complication of anesthesia     difficulty urinating after anesthesia  . Headache     "monthly" (04/08/2015)  . Arthritis     "in my back" (04/08/2015)  . Chronic lower back pain     Past Surgical History  Procedure Laterality Date  . Laparoscopic abdominal exploration  1997    "to check for endometriosis"  . Cesarean section  2007 and 2009  . Combined abdominoplasty and liposuction  12/2014    "liposuction in thighs"  . Tibia fracture surgery Right 04/08/2015    ORIF tibial pilon  . Fracture surgery    . Laparoscopic cholecystectomy  2007  . Open reduction internal fixation (orif) tibia/fibula fracture Right 04/08/2015    Procedure: OPEN REDUCTION INTERNAL FIXATION (ORIF)  RIGHT TIBIA PILON FRACTURE;  Surgeon: Wylene Simmer, MD;  Location: [facility];  Service: Orthopedics;  Laterality: Right;    There were no vitals filed for this visit.  Visit Diagnosis:  Pain, ankle, right  Ankle weakness  Stiffness due to immobility  Abnormal gait      Subjective Assessment - 08/19/15 0939    Subjective Patient reports she learned she will not need surgery after all for her knee and received an injection to her knee last Thursday. States knee was  very painful on Friday causing her to cancel her PT appt and spent the day icing and taking Alieve. Woke up Saturday with no pain. Saw orthopedic surgeon yesterday who stated bone healing progressing well and cleared her to run and jump. Patient states she tried spin class last night and felt good afterward with improved mobility and no increased pain.   Currently in Pain? Yes   Pain Score 1    Pain Location Ankle   Pain Orientation Right;Posterior   Pain Descriptors / Indicators --  Stiffness           TODAY'S TREATMENT  TherEx Nustep lvl 5 x 6' Rt ankle Prostretch DF stretch 3x30" each with knee straight and flexed Heel raises at back of UBE - DL concentric & Rt SL eccentric lowering 15x3"  Rt Fwd step-up to 6" step x10, 8" step x10 Leg press - ankle press DL 10# x10, 15# x10 SLS on blue foam 2x30" with intermittent light 1 pole support Tandem stance on blue foam 2x30" each with alternating forward foot with intermittent light 1 pole support Standing on blue foam with step-touch to 8" step x20 Seated BAPS L3 - CW/CCW x10 each; PF/DF with 5# at P position x10; PF/DF with 2.5# at 4 remaining positions x10            PT Education -  08/19/15 1202    Education provided Yes   Education Details Blue TB provided for DF/PF HEP, Pt to continue with green TB for IV/EV   Person(s) Educated Patient   Methods Explanation   Comprehension Verbalized understanding               PT Long Term Goals - 08/19/15 1203    PT LONG TERM GOAL #1   Title Increase AROM Rt ankle DF/inv/eversiion to equal to Lt ankle 08/28/15   Status On-going   PT LONG TERM GOAL #2   Title 5-/5 to 5/5 strehgth Rt ankle 08/28/15   Status On-going   PT LONG TERM GOAL #3   Title Normal gait pattern with equal weight bearing Rt/Lt LE's 08/28/15   Status On-going   PT LONG TERM GOAL #4   Title Walk for 15-30 min with min to no limitations 08/28/15   Status On-going   PT LONG TERM GOAL #5   Title  Improve FOTO to </=42% limitation 08/28/15   Status On-going               Plan - 08/19/15 1156    Clinical Impression Statement Patient cleared for full activity by orthopedic surgeon. Patient reporting minimal to no pain, just stiffness but overall sensation of improved motion in right ankle. Tolerating increased strengthening and stability exercises with no increased pain. Reporting increased tolerance/strength with DF/PF movements exercises but continues to feel weaker and more unstable laterally.   PT Next Visit Plan Rt LE strengthening, stability training, ROM/MMT check   Consulted and Agree with Plan of Care Patient        Problem List Patient Active Problem List   Diagnosis Date Noted  . PMDD (premenstrual dysphoric disorder) 07/29/2015  . Pilon fracture 04/08/2015  . Skin lesion 11/19/2014  . Preventative health care 10/29/2014  . Obesity 06/10/2014  . Chronic meniscal tear of knee 11/23/2013  . Lumbar pain with radiation down both legs 09/11/2013  . Generalized anxiety disorder 06/18/2013    Percival Spanish, PT, MPT 08/19/2015, 12:04 PM  [facility] [address]  [place], [place], [address] Phone: [phone]   Fax:  [phone]  Name: Gina James MRN: [id number] Date of Birth: 1980/12/11

## 2015-08-22 ENCOUNTER — Ambulatory Visit: Payer: BLUE CROSS/BLUE SHIELD | Admitting: Physical Therapy

## 2015-08-22 DIAGNOSIS — M2560 Stiffness of unspecified joint, not elsewhere classified: Secondary | ICD-10-CM

## 2015-08-22 DIAGNOSIS — R269 Unspecified abnormalities of gait and mobility: Secondary | ICD-10-CM

## 2015-08-22 DIAGNOSIS — Z7409 Other reduced mobility: Secondary | ICD-10-CM

## 2015-08-22 DIAGNOSIS — M25571 Pain in right ankle and joints of right foot: Secondary | ICD-10-CM

## 2015-08-22 DIAGNOSIS — R29898 Other symptoms and signs involving the musculoskeletal system: Secondary | ICD-10-CM

## 2015-08-22 NOTE — Therapy (Signed)
[facility] [address]  [place], [place], [address] Phone: [phone]   Fax:  [phone]  Physical Therapy Treatment  Patient Details  Name: Gina James MRN: [id number] Date of Birth: July 04, 1981 Referring Provider: Dr. Doran Durand  Encounter Date: 08/22/2015      PT End of Session - 08/22/15 0936    Visit Number 6   Number of Visits 12   Date for PT Re-Evaluation 09/16/15   PT Start Time 0930   PT Stop Time 1013   PT Time Calculation (min) 43 min   Activity Tolerance Patient tolerated treatment well   Behavior During Therapy [facility] for tasks assessed/performed      Past Medical History  Diagnosis Date  . Anxiety   . Post partum depression   . Ankle fracture, right   . Complication of anesthesia     difficulty urinating after anesthesia  . Headache     "monthly" (04/08/2015)  . Arthritis     "in my back" (04/08/2015)  . Chronic lower back pain     Past Surgical History  Procedure Laterality Date  . Laparoscopic abdominal exploration  1997    "to check for endometriosis"  . Cesarean section  2007 and 2009  . Combined abdominoplasty and liposuction  12/2014    "liposuction in thighs"  . Tibia fracture surgery Right 04/08/2015    ORIF tibial pilon  . Fracture surgery    . Laparoscopic cholecystectomy  2007  . Open reduction internal fixation (orif) tibia/fibula fracture Right 04/08/2015    Procedure: OPEN REDUCTION INTERNAL FIXATION (ORIF)  RIGHT TIBIA PILON FRACTURE;  Surgeon: Wylene Simmer, MD;  Location: [place];  Service: Orthopedics;  Laterality: Right;    There were no vitals filed for this visit.  Visit Diagnosis:  Pain, ankle, right  Ankle weakness  Stiffness due to immobility  Abnormal gait      Subjective Assessment - 08/22/15 0934    Subjective Patient reports knee injection has worked well and has been able to do pilates and yoga without pain in knee.    Currently in Pain? Yes   Pain  Score --  1.5   Pain Location Ankle   Pain Orientation Right;Medial            TODAY'S TREATMENT  TherEx Rec bike lvl 3 x 6' Rt ankle Prostretch DF stretch 3x30" each with knee straight and flexed Seated BAPS L3 - CW/CCW x10 each; PF/DF with 10# at P position 2x15; PF/DF with 5# at 4 remaining positions x20  Heel raises at back of UBE - DL concentric & Rt SL eccentric lowering x3"  SLS on blue foam 3x30" with intermittent light 1 pole support Leg press - ankle press DL 15# x15 SLS stabilization with red TB x10 medial/lateral Tandem stance on blue foam 2x30" each with alternating forward foot with intermittent light 1 pole support  Rt Fwd step-up to BOSU (up) 15x3"           PT Education - 08/22/15 1013    Education provided Yes   Education Details HEP update - SLS stabilization with red TB   Person(s) Educated Patient   Methods Explanation;Demonstration   Comprehension Verbalized understanding;Returned demonstration               PT Long Term Goals - 08/19/15 1203    PT LONG TERM GOAL #1   Title Increase AROM Rt ankle DF/inv/eversiion to equal to Lt ankle 08/28/15  Status On-going   PT LONG TERM GOAL #2   Title 5-/5 to 5/5 strehgth Rt ankle 08/28/15   Status On-going   PT LONG TERM GOAL #3   Title Normal gait pattern with equal weight bearing Rt/Lt LE's 08/28/15   Status On-going   PT LONG TERM GOAL #4   Title Walk for 15-30 min with min to no limitations 08/28/15   Status On-going   PT LONG TERM GOAL #5   Title Improve FOTO to </=42% limitation 08/28/15   Status On-going               Plan - 08/22/15 1210    Clinical Impression Statement Patient reports pain remains very mild with location fluctuating in location (1/10 medial ankle today). Has been increasing activity this week including pilates and yoga with fatigue/instability noted but no increased pain. Overall progressing well with PT POC with improving tolerance for strengthening and  stability exercises without increased pain.   PT Next Visit Plan Rt LE strengthening, stability training, ROM/MMT check   Consulted and Agree with Plan of Care Patient        Problem List Patient Active Problem List   Diagnosis Date Noted  . PMDD (premenstrual dysphoric disorder) 07/29/2015  . Pilon fracture 04/08/2015  . Skin lesion 11/19/2014  . Preventative health care 10/29/2014  . Obesity 06/10/2014  . Chronic meniscal tear of knee 11/23/2013  . Lumbar pain with radiation down both legs 09/11/2013  . Generalized anxiety disorder 06/18/2013    Percival Spanish, PT, MPT 08/22/2015, 12:15 PM  [facility] [address]  [place], [place], [address] Phone: [phone]   Fax:  [phone]  Name: Gina James MRN: [id number] Date of Birth: [birth year]

## 2015-08-26 ENCOUNTER — Ambulatory Visit: Payer: BLUE CROSS/BLUE SHIELD | Admitting: Physical Therapy

## 2015-08-26 DIAGNOSIS — R269 Unspecified abnormalities of gait and mobility: Secondary | ICD-10-CM

## 2015-08-26 DIAGNOSIS — Z7409 Other reduced mobility: Secondary | ICD-10-CM

## 2015-08-26 DIAGNOSIS — M25571 Pain in right ankle and joints of right foot: Secondary | ICD-10-CM | POA: Diagnosis not present

## 2015-08-26 DIAGNOSIS — R29898 Other symptoms and signs involving the musculoskeletal system: Secondary | ICD-10-CM

## 2015-08-26 DIAGNOSIS — M2560 Stiffness of unspecified joint, not elsewhere classified: Secondary | ICD-10-CM

## 2015-08-26 NOTE — Therapy (Signed)
[facility] [address]  [place], [place], [address] Phone: [phone]   Fax:  [phone]  Physical Therapy Treatment  Patient Details  Name: Gina James MRN: [id number] Date of Birth: July 19, 1981 Referring Provider: Dr. Doran Durand  Encounter Date: 08/26/2015      PT End of Session - 08/26/15 0944    Visit Number 7   Number of Visits 12   Date for PT Re-Evaluation 09/16/15   PT Start Time 0935   PT Stop Time 1034   PT Time Calculation (min) 59 min   Activity Tolerance Patient tolerated treatment well;No increased pain   Behavior During Therapy [facility] for tasks assessed/performed      Past Medical History  Diagnosis Date  . Anxiety   . Post partum depression   . Ankle fracture, right   . Complication of anesthesia     difficulty urinating after anesthesia  . Headache     "monthly" (04/08/2015)  . Arthritis     "in my back" (04/08/2015)  . Chronic lower back pain     Past Surgical History  Procedure Laterality Date  . Laparoscopic abdominal exploration  1997    "to check for endometriosis"  . Cesarean section  2007 and 2009  . Combined abdominoplasty and liposuction  12/2014    "liposuction in thighs"  . Tibia fracture surgery Right 04/08/2015    ORIF tibial pilon  . Fracture surgery    . Laparoscopic cholecystectomy  2007  . Open reduction internal fixation (orif) tibia/fibula fracture Right 04/08/2015    Procedure: OPEN REDUCTION INTERNAL FIXATION (ORIF)  RIGHT TIBIA PILON FRACTURE;  Surgeon: Wylene Simmer, MD;  Location: [facility];  Service: Orthopedics;  Laterality: Right;    There were no vitals filed for this visit.  Visit Diagnosis:  Pain, ankle, right  Ankle weakness  Stiffness due to immobility  Abnormal gait      Subjective Assessment - 08/26/15 0941    Subjective Patient reports ankle hurting more today, thinks it could be from hitting her lower leg while walking behind a shopping cart on  Sunday and/or from sitting "criss-cross applesauce" while wrapping Christmas presents yesterday. Did not do exercises yesterday due to increased pain.   Currently in Pain? Yes   Pain Score 4    Pain Location Leg   Pain Orientation Lower   Pain Descriptors / Indicators --  Bruised            TODAY'S TREATMENT  TherEx Nustep - lvl 5 x 5' Seated BAPS L3 - CW/CCW x10 each; PF/DF with 10# at P position x20; PF/DF with 5# at 4 remaining positions x20  Leg press - ankle press DL 15# 2x10 SLS stabilization with red TB x10 medial/lateral TRX squat with heel raises 2x10 Heel raises at back of UBE - DL concentric & Rt SL eccentric lowering 20x3"  SLS on blue foam oval with vector touch fwd/side back x10 each with intermittent light 1 pole support Tandem stance on blue foam 2x30" each with alternating forward foot with intermittent light 1 pole support  Rt Fwd step-up to BOSU (up) 15x3" with 1 pole A    Vasopnuematic compression - R ankle - med compression, lowest temp x15'           PT Long Term Goals - 08/26/15 1019    PT LONG TERM GOAL #1   Title Increase AROM Rt ankle DF/inv/eversiion to equal to Lt ankle 08/28/15   Status  On-going   PT LONG TERM GOAL #2   Title 5-/5 to 5/5 strehgth Rt ankle 08/28/15   Status On-going   PT LONG TERM GOAL #3   Title Normal gait pattern with equal weight bearing Rt/Lt LE's 08/28/15   Status On-going   PT LONG TERM GOAL #4   Title Walk for 15-30 min with min to no limitations 08/28/15   Status On-going   PT LONG TERM GOAL #5   Title Improve FOTO to </=42% limitation 08/28/15   Status On-going               Plan - 08/26/15 1019    Clinical Impression Statement Patient reporting increased pain in lower leg proximal to ankle consistent with bruising from leg hitting shopping cart on Sunday. No increased ankle pain noted with exercises but limited progression of exercises today and utilized vasopneumatic compression to reduce pain  and swelling from bruising.   PT Next Visit Plan Rt LE strengthening, stability training   Consulted and Agree with Plan of Care Patient        Problem List Patient Active Problem List   Diagnosis Date Noted  . PMDD (premenstrual dysphoric disorder) 07/29/2015  . Pilon fracture 04/08/2015  . Skin lesion 11/19/2014  . Preventative health care 10/29/2014  . Obesity 06/10/2014  . Chronic meniscal tear of knee 11/23/2013  . Lumbar pain with radiation down both legs 09/11/2013  . Generalized anxiety disorder 06/18/2013    Percival Spanish, PT, MPT 08/26/2015, 10:51 AM  [facility] [address]  [place], [place], [address] Phone: [phone]   Fax:  [phone]  Name: Gina James MRN: [id number] Date of Birth: 22-Aug-1981

## 2015-08-29 ENCOUNTER — Ambulatory Visit: Payer: BLUE CROSS/BLUE SHIELD | Admitting: Physical Therapy

## 2015-09-10 ENCOUNTER — Ambulatory Visit: Payer: BLUE CROSS/BLUE SHIELD | Attending: Orthopedic Surgery | Admitting: Physical Therapy

## 2015-09-10 DIAGNOSIS — M623 Immobility syndrome (paraplegic): Secondary | ICD-10-CM | POA: Diagnosis present

## 2015-09-10 DIAGNOSIS — M259 Joint disorder, unspecified: Secondary | ICD-10-CM | POA: Insufficient documentation

## 2015-09-10 DIAGNOSIS — M2560 Stiffness of unspecified joint, not elsewhere classified: Secondary | ICD-10-CM

## 2015-09-10 DIAGNOSIS — Z7409 Other reduced mobility: Secondary | ICD-10-CM

## 2015-09-10 DIAGNOSIS — R29898 Other symptoms and signs involving the musculoskeletal system: Secondary | ICD-10-CM

## 2015-09-10 DIAGNOSIS — M25571 Pain in right ankle and joints of right foot: Secondary | ICD-10-CM | POA: Diagnosis present

## 2015-09-10 DIAGNOSIS — R269 Unspecified abnormalities of gait and mobility: Secondary | ICD-10-CM | POA: Insufficient documentation

## 2015-09-10 NOTE — Therapy (Signed)
[facility] [address]  [place], [place], [address] Phone: [phone]   Fax:  [phone]  Physical Therapy Treatment  Patient Details  Name: Gina James MRN: [id number] Date of Birth: June 15, 1981 Referring Provider: Dr. Doran Durand  Encounter Date: 09/10/2015      PT End of Session - 09/10/15 1024    Visit Number 8   Number of Visits 12   Date for PT Re-Evaluation 09/17/15   PT Start Time 1021   PT Stop Time 1102   PT Time Calculation (min) 41 min   Activity Tolerance Patient tolerated treatment well   Behavior During Therapy [facility] for tasks assessed/performed      Past Medical History  Diagnosis Date  . Anxiety   . Post partum depression   . Ankle fracture, right   . Complication of anesthesia     difficulty urinating after anesthesia  . Headache     "monthly" (04/08/2015)  . Arthritis     "in my back" (04/08/2015)  . Chronic lower back pain     Past Surgical History  Procedure Laterality Date  . Laparoscopic abdominal exploration  1997    "to check for endometriosis"  . Cesarean section  2007 and 2009  . Combined abdominoplasty and liposuction  12/2014    "liposuction in thighs"  . Tibia fracture surgery Right 04/08/2015    ORIF tibial pilon  . Fracture surgery    . Laparoscopic cholecystectomy  2007  . Open reduction internal fixation (orif) tibia/fibula fracture Right 04/08/2015    Procedure: OPEN REDUCTION INTERNAL FIXATION (ORIF)  RIGHT TIBIA PILON FRACTURE;  Surgeon: Wylene Simmer, MD;  Location: [place];  Service: Orthopedics;  Laterality: Right;    There were no vitals filed for this visit.  Visit Diagnosis:  Pain, ankle, right  Ankle weakness  Stiffness due to immobility  Abnormal gait      Subjective Assessment - 09/10/15 1022    Subjective Patient stating today is a good day - no pain. Reports ankle does get stiff if she does not keep it moving. Went to pilates today without issues.  Reports able to clean one floor of house without having to take a break.   How long can you walk comfortably? ~10 minutes   Patient Stated Goals be able walk and not have to stop every few minutes;  to hike and kick box; return to normal activities    Currently in Pain? No/denies   Pain Score --  Still gets up to 3-4/10 at times          TODAY'S TREATMENT  TherEx Seated BAPS L4 - R ankle CW/CCW x10 each; PF/DF with 10# at P position x15; PF/DF with 7.5# at 4 remaining positions x15  Heel raises at back of UBE - Rt SL concentric/eccentric lowering 15x3"  Side-stepping in partial squat with red TB around midfoot 2x56ft, bil R SLS stabilization on blue foam oval with red TB x10 medial/lateral TRX squat with heel raises x20 Tandem walking on foam balance beam - Fwd x 4 passes, Fwd/back x 3 passes Rt SL Fwd step-up to BOSU (up) 15x3" with 1 pole A Rt DL Lat step-up to BOSU (up) 15x3" with 1 pole A Minisquat on BOSU (inverted) with 5# db held at 90 dg shoulder flexion to promote upright posture Rt SLS on BOSU (inverted) 2x30" with intermittent 1 pole A           PT Long Term Goals -  09/10/15 1032    PT LONG TERM GOAL #1   Title Increase AROM Rt ankle DF/inv/eversion to equal to Lt ankle (09/17/15)   Status On-going   PT LONG TERM GOAL #2   Title 5-/5 to 5/5 strehgth Rt ankle (09/17/15)   Status On-going   PT LONG TERM GOAL #3   Title Normal gait pattern with equal weight bearing Rt/Lt LE's 08/28/15   Status Achieved   PT LONG TERM GOAL #4   Title Walk for 15-30 min with min to no limitations (09/17/15)   Status On-going   PT LONG TERM GOAL #5   Title Improve FOTO to </=42% limitation (09/17/15)   Status On-going               Plan - 09/10/15 1104    Clinical Impression Statement Patient without pain today but continues to fatigue with exercises. Patient progressing well with PT, tolerating progression to increasingly unstable surfaces with static and dynamic  exercises and reporting decreased limitation due to ankle with daily activities.   PT Next Visit Plan Rt LE strengthening, stability training   Consulted and Agree with Plan of Care Patient        Problem List Patient Active Problem List   Diagnosis Date Noted  . PMDD (premenstrual dysphoric disorder) 07/29/2015  . Pilon fracture 04/08/2015  . Skin lesion 11/19/2014  . Preventative health care 10/29/2014  . Obesity 06/10/2014  . Chronic meniscal tear of knee 11/23/2013  . Lumbar pain with radiation down both legs 09/11/2013  . Generalized anxiety disorder 06/18/2013    Percival Spanish, PT, MPT 09/10/2015, 11:14 AM  [facility] [address]  [place], [place], [address] Phone: [phone]   Fax:  [phone]  Name: Gina James MRN: [id number] Date of Birth: October 02, 1980

## 2015-09-11 ENCOUNTER — Ambulatory Visit: Payer: BLUE CROSS/BLUE SHIELD | Admitting: Physical Therapy

## 2015-09-11 DIAGNOSIS — R269 Unspecified abnormalities of gait and mobility: Secondary | ICD-10-CM

## 2015-09-11 DIAGNOSIS — Z7409 Other reduced mobility: Secondary | ICD-10-CM

## 2015-09-11 DIAGNOSIS — R29898 Other symptoms and signs involving the musculoskeletal system: Secondary | ICD-10-CM

## 2015-09-11 DIAGNOSIS — M2560 Stiffness of unspecified joint, not elsewhere classified: Secondary | ICD-10-CM

## 2015-09-11 DIAGNOSIS — M25571 Pain in right ankle and joints of right foot: Secondary | ICD-10-CM

## 2015-09-11 NOTE — Therapy (Signed)
[facility] [address]  [place], [place], [address] Phone: [phone]   Fax:  [phone]  Physical Therapy Treatment  Patient Details  Name: Gina James MRN: [id number] Date of Birth: 1980-10-09 Referring Provider: Dr. Doran Durand  Encounter Date: 09/11/2015      PT End of Session - 09/11/15 0857    Visit Number 9   Number of Visits 12   Date for PT Re-Evaluation 09/17/15   PT Start Time 0853   PT Stop Time 0935   PT Time Calculation (min) 42 min   Activity Tolerance Patient tolerated treatment well   Behavior During Therapy [facility] for tasks assessed/performed      Past Medical History  Diagnosis Date  . Anxiety   . Post partum depression   . Ankle fracture, right   . Complication of anesthesia     difficulty urinating after anesthesia  . Headache     "monthly" (04/08/2015)  . Arthritis     "in my back" (04/08/2015)  . Chronic lower back pain     Past Surgical History  Procedure Laterality Date  . Laparoscopic abdominal exploration  1997    "to check for endometriosis"  . Cesarean section  2007 and 2009  . Combined abdominoplasty and liposuction  12/2014    "liposuction in thighs"  . Tibia fracture surgery Right 04/08/2015    ORIF tibial pilon  . Fracture surgery    . Laparoscopic cholecystectomy  2007  . Open reduction internal fixation (orif) tibia/fibula fracture Right 04/08/2015    Procedure: OPEN REDUCTION INTERNAL FIXATION (ORIF)  RIGHT TIBIA PILON FRACTURE;  Surgeon: Wylene Simmer, MD;  Location: [place];  Service: Orthopedics;  Laterality: Right;    There were no vitals filed for this visit.  Visit Diagnosis:  Pain, ankle, right  Ankle weakness  Stiffness due to immobility  Abnormal gait      Subjective Assessment - 09/11/15 0856    Subjective Reporting some increased muscle soreness after yesterday's therapy session, but no pain.   Currently in Pain? No/denies           TODAY'S  TREATMENT  TherEx Nustep - lvl 5 x 5' Leg press - ankle press DL 20# x10, 25# x15; Rt SL 15# x15 Doorway lunges x15, Bil Lt hip flexor stretch (lunge position) 3x20" (secondary to c/o left hip tightness during lunges) Rapid alternating step-up on 7" step (bottom of stairwell) 3x10 SLS on blue foam oval with vector touch to cones fwd/side back x10 each with intermittent light 1 pole support Minitramp:   Heel/toe raises x20   Slow jog x20   Small hop x20 Small stationary DL hop 2x10           PT Long Term Goals - 09/11/15 0941    PT LONG TERM GOAL #1   Title Increase AROM Rt ankle DF/inv/eversion to equal to Lt ankle (09/17/15)   Status On-going   PT LONG TERM GOAL #2   Title 5-/5 to 5/5 strength Rt ankle (09/17/15)   Status On-going   PT LONG TERM GOAL #3   Title Normal gait pattern with equal weight bearing Rt/Lt LE's 08/28/15   Status Achieved   PT LONG TERM GOAL #4   Title Walk for 15-30 min with min to no limitations (09/17/15)   Status On-going   PT LONG TERM GOAL #5   Title Improve FOTO to </=42% limitation (09/17/15)   Status On-going  Plan - 09/11/15 0937    Clinical Impression Statement Patient noting increased muscle soreness yesterday after therapy but no pain and better this morning. Introduced rapid alternating movements and preliminary plyometrics with good patient tolernace and no pain. Will plan to continue progression of more dynamic activities as patient wanting to return to kick-boxing workouts.   PT Next Visit Plan Rt LE strengthening, stability training, progression of dynamic and plyometric training   Consulted and Agree with Plan of Care Patient        Problem List Patient Active Problem List   Diagnosis Date Noted  . PMDD (premenstrual dysphoric disorder) 07/29/2015  . Pilon fracture 04/08/2015  . Skin lesion 11/19/2014  . Preventative health care 10/29/2014  . Obesity 06/10/2014  . Chronic meniscal tear of knee  11/23/2013  . Lumbar pain with radiation down both legs 09/11/2013  . Generalized anxiety disorder 06/18/2013    Percival Spanish, PT, MPT 09/11/2015, 9:45 AM  [facility] [address]  [place], [place], [address] Phone: [phone]   Fax:  [phone]  Name: Gina James MRN: [id number] Date of Birth: 1981-02-13

## 2015-09-15 ENCOUNTER — Ambulatory Visit: Payer: BLUE CROSS/BLUE SHIELD | Admitting: Physical Therapy

## 2015-09-17 ENCOUNTER — Ambulatory Visit: Payer: BLUE CROSS/BLUE SHIELD | Admitting: Physical Therapy

## 2015-09-17 DIAGNOSIS — R29898 Other symptoms and signs involving the musculoskeletal system: Secondary | ICD-10-CM

## 2015-09-17 DIAGNOSIS — M25571 Pain in right ankle and joints of right foot: Secondary | ICD-10-CM | POA: Diagnosis not present

## 2015-09-17 DIAGNOSIS — Z7409 Other reduced mobility: Secondary | ICD-10-CM

## 2015-09-17 DIAGNOSIS — M2560 Stiffness of unspecified joint, not elsewhere classified: Secondary | ICD-10-CM

## 2015-09-17 NOTE — Therapy (Signed)
[facility] [address]  [place], [place], [address] Phone: [phone]   Fax:  [phone]  Physical Therapy Treatment  Patient Details  Name: Gina James MRN: [id number] Date of Birth: Mar 14, 1981 Referring Provider: Dr. Doran Durand  Encounter Date: 09/17/2015      PT End of Session - 09/17/15 0856    Visit Number 10   Number of Visits 12   Date for PT Re-Evaluation 09/24/15   PT Start Time 0849   PT Stop Time 0928   PT Time Calculation (min) 39 min   Activity Tolerance Patient tolerated treatment well   Behavior During Therapy [facility] for tasks assessed/performed      Past Medical History  Diagnosis Date  . Anxiety   . Post partum depression   . Ankle fracture, right   . Complication of anesthesia     difficulty urinating after anesthesia  . Headache     "monthly" (04/08/2015)  . Arthritis     "in my back" (04/08/2015)  . Chronic lower back pain     Past Surgical History  Procedure Laterality Date  . Laparoscopic abdominal exploration  1997    "to check for endometriosis"  . Cesarean section  2007 and 2009  . Combined abdominoplasty and liposuction  12/2014    "liposuction in thighs"  . Tibia fracture surgery Right 04/08/2015    ORIF tibial pilon  . Fracture surgery    . Laparoscopic cholecystectomy  2007  . Open reduction internal fixation (orif) tibia/fibula fracture Right 04/08/2015    Procedure: OPEN REDUCTION INTERNAL FIXATION (ORIF)  RIGHT TIBIA PILON FRACTURE;  Surgeon: Wylene Simmer, MD;  Location: [place];  Service: Orthopedics;  Laterality: Right;    There were no vitals filed for this visit.  Visit Diagnosis:  Ankle weakness  Stiffness due to immobility      Subjective Assessment - 09/17/15 0853    Subjective Patient reporting LE muscle soreness for 3 days after last visit, but patient was seen 2 days in a row.   Currently in Pain? No/denies            [facility] PT Assessment - 09/17/15  0848    Observation/Other Assessments   Focus on Therapeutic Outcomes (FOTO)  32% limitation           TODAY'S TREATMENT  TherEx Nustep - lvl 6 x 5' Rt SL Fwd step-up to BOSU (up) 20x3" with intermittent 1 pole A Rt DL Lat step-up to BOSU (up) 20x3" with intermittent 1 pole A TRX squat with heel raises x20 Side-stepping in partial squat with red TB around midfoot 2x70ft, bil R lunge with L foot suspended in TRX 2x10 Alternating SL hop x20 Small stationary DL hop x10 DL Lateral line hop x10 DL Fwb/back line hop x10 Tandem walking on foam balance beam - Fwd/back x 5 passes Minisquat on BOSU (inverted) with 10# db held at 90 dg shoulder flexion to promote upright posture x15           PT Long Term Goals - 09/17/15 0930    PT LONG TERM GOAL #1   Title Increase AROM Rt ankle DF/inv/eversion to equal to Lt ankle (09/17/15)   Status On-going   PT LONG TERM GOAL #2   Title 5-/5 to 5/5 strength Rt ankle (09/17/15)   Status On-going   PT LONG TERM GOAL #3   Title Normal gait pattern with equal weight bearing Rt/Lt LE's 08/28/15   Status Achieved  PT LONG TERM GOAL #4   Title Walk for 15-30 min with min to no limitations (09/17/15)   Status On-going   PT LONG TERM GOAL #5   Title Improve FOTO to </=42% limitation (09/17/15)   Status Achieved  32% limitation               Plan - 09/17/15 0925    Clinical Impression Statement Patient tolerating progression of dynamic balance and strengthening as well as plyometric activties with fatigue noted but no pain. Will plan for recert at 12 visit to continue emphasis on dynamic activities to to increase stength and stability for return to kick-boxing workouts.   PT Next Visit Plan Rt LE strengthening, stability training, progression of dynamic and plyometric training   Consulted and Agree with Plan of Care Patient        Problem List Patient Active Problem List   Diagnosis Date Noted  . PMDD (premenstrual dysphoric  disorder) 07/29/2015  . Pilon fracture 04/08/2015  . Skin lesion 11/19/2014  . Preventative health care 10/29/2014  . Obesity 06/10/2014  . Chronic meniscal tear of knee 11/23/2013  . Lumbar pain with radiation down both legs 09/11/2013  . Generalized anxiety disorder 06/18/2013    Percival Spanish, PT, MPT 09/17/2015, 9:52 AM  [facility] [address]  [place], [place], [address] Phone: [phone]   Fax:  [phone]  Name: Gina James MRN: [id number] Date of Birth: 1980/09/24

## 2015-09-24 ENCOUNTER — Ambulatory Visit: Payer: BLUE CROSS/BLUE SHIELD | Admitting: Physical Therapy

## 2015-09-24 DIAGNOSIS — Z7409 Other reduced mobility: Secondary | ICD-10-CM

## 2015-09-24 DIAGNOSIS — R269 Unspecified abnormalities of gait and mobility: Secondary | ICD-10-CM

## 2015-09-24 DIAGNOSIS — R29898 Other symptoms and signs involving the musculoskeletal system: Secondary | ICD-10-CM

## 2015-09-24 DIAGNOSIS — M2560 Stiffness of unspecified joint, not elsewhere classified: Secondary | ICD-10-CM

## 2015-09-24 DIAGNOSIS — M25571 Pain in right ankle and joints of right foot: Secondary | ICD-10-CM | POA: Diagnosis not present

## 2015-09-24 NOTE — Therapy (Signed)
[facility] [address]  [place], [place], [address] Phone: [phone]   Fax:  [phone]  Physical Therapy Treatment  Patient Details  Name: Gina James MRN: [id number] Date of Birth: [birth year] Referring Provider: Dr. Doran Durand  Encounter Date: 09/24/2015      PT End of Session - 09/24/15 0856    Visit Number 11   Number of Visits 12   Date for PT Re-Evaluation 09/26/15   PT Start Time 0850   PT Stop Time 0929   PT Time Calculation (min) 39 min   Activity Tolerance Patient tolerated treatment well   Behavior During Therapy [facility] for tasks assessed/performed      Past Medical History  Diagnosis Date  . Anxiety   . Post partum depression   . Ankle fracture, right   . Complication of anesthesia     difficulty urinating after anesthesia  . Headache     "monthly" (04/08/2015)  . Arthritis     "in my back" (04/08/2015)  . Chronic lower back pain     Past Surgical History  Procedure Laterality Date  . Laparoscopic abdominal exploration  1997    "to check for endometriosis"  . Cesarean section  2007 and 2009  . Combined abdominoplasty and liposuction  12/2014    "liposuction in thighs"  . Tibia fracture surgery Right 04/08/2015    ORIF tibial pilon  . Fracture surgery    . Laparoscopic cholecystectomy  2007  . Open reduction internal fixation (orif) tibia/fibula fracture Right 04/08/2015    Procedure: OPEN REDUCTION INTERNAL FIXATION (ORIF)  RIGHT TIBIA PILON FRACTURE;  Surgeon: Wylene Simmer, MD;  Location: [place];  Service: Orthopedics;  Laterality: Right;    There were no vitals filed for this visit.  Visit Diagnosis:  Ankle weakness  Stiffness due to immobility  Abnormal gait      Subjective Assessment - 09/24/15 0854    Subjective Patient reporting no pain recently but still feels weak in ankle. Able to complete normal household activities without limitation, but still feels limited outdoors on  uneven ground.   Currently in Pain? No/denies           TODAY'S TREATMENT  TherEx Nustep - lvl 7 x 6' Rt SLS on aqua disc with vector touch to cones fwd/side back x10 each with light 1 pole support Rt SL Fwd step-up to BOSU (up) with Lt hip/knee flexion march 20x3" with intermittent 1 pole A Bil DL Lat step-up to BOSU (up) 20x3" with intermittent 1 pole A DL Lateral line hop x15 DL Fwb/back line hop x15 Heel raises at back of UBE - Rt SL concentric/eccentric lowering 13x3"  Toe walking x30ft Heel walking x 23ft Side-stepping in partial squat with green TB around midfoot 2x46ft, bil Tandem walking on foam balance beam - Fwd/back x 5 passes            PT Long Term Goals - 09/24/15 0927    PT LONG TERM GOAL #1   Title Increase AROM Rt ankle DF/inv/eversion to equal to Lt ankle (09/17/15)   Status On-going   PT LONG TERM GOAL #2   Title 5-/5 to 5/5 strength Rt ankle (09/17/15)   Status On-going   PT LONG TERM GOAL #3   Title Normal gait pattern with equal weight bearing Rt/Lt LE's 08/28/15   Status Achieved   PT LONG TERM GOAL #4   Title Walk for 15-30 min with min to no limitations (  09/17/15)   Status On-going   PT LONG TERM GOAL #5   Title Improve FOTO to </=42% limitation (09/17/15)   Status Achieved               Plan - 09/24/15 0924    Clinical Impression Statement Patient demonstrating continued good progress with PT, reporting improved ability to complete household tasks without limitations from ankle but continues to be limited with outdoor activities and return to kick-boxing workouts. Will plan for recert at next visit to continue to address these deficits.   PT Next Visit Plan Recert for Rt LE strengthening, stability training, progression of dynamic and plyometric training   Consulted and Agree with Plan of Care Patient        Problem List Patient Active Problem List   Diagnosis Date Noted  . PMDD (premenstrual dysphoric disorder) 07/29/2015   . Pilon fracture 04/08/2015  . Skin lesion 11/19/2014  . Preventative health care 10/29/2014  . Obesity 06/10/2014  . Chronic meniscal tear of knee 11/23/2013  . Lumbar pain with radiation down both legs 09/11/2013  . Generalized anxiety disorder 06/18/2013    Percival Spanish, PT, MPT 09/24/2015, 9:29 AM  [facility] [address]  [place], [place], [address] Phone: [phone]   Fax:  [phone]  Name: Gina James MRN: [id number] Date of Birth: [birth year]

## 2015-09-26 ENCOUNTER — Ambulatory Visit: Payer: BLUE CROSS/BLUE SHIELD | Admitting: Physical Therapy

## 2015-09-26 DIAGNOSIS — Z7409 Other reduced mobility: Secondary | ICD-10-CM

## 2015-09-26 DIAGNOSIS — M25571 Pain in right ankle and joints of right foot: Secondary | ICD-10-CM | POA: Diagnosis not present

## 2015-09-26 DIAGNOSIS — R269 Unspecified abnormalities of gait and mobility: Secondary | ICD-10-CM

## 2015-09-26 DIAGNOSIS — R29898 Other symptoms and signs involving the musculoskeletal system: Secondary | ICD-10-CM

## 2015-09-26 DIAGNOSIS — M2560 Stiffness of unspecified joint, not elsewhere classified: Secondary | ICD-10-CM

## 2015-09-26 NOTE — Therapy (Signed)
[facility] [address]  [place], [place], [address] Phone: [phone]   Fax:  [phone]  Physical Therapy Treatment  Patient Details  Name: Gina James MRN: [id number] Date of Birth: 01-10-1981 Referring Provider: Dr. Doran Durand  Encounter Date: 09/26/2015      PT End of Session - 09/26/15 0938    Visit Number 12   Number of Visits 16   Date for PT Re-Evaluation 10/10/15   PT Start Time 0932   PT Stop Time 1015   PT Time Calculation (min) 43 min   Activity Tolerance Patient tolerated treatment well   Behavior During Therapy [facility] for tasks assessed/performed      Past Medical History  Diagnosis Date  . Anxiety   . Post partum depression   . Ankle fracture, right   . Complication of anesthesia     difficulty urinating after anesthesia  . Headache     "monthly" (04/08/2015)  . Arthritis     "in my back" (04/08/2015)  . Chronic lower back pain     Past Surgical History  Procedure Laterality Date  . Laparoscopic abdominal exploration  1997    "to check for endometriosis"  . Cesarean section  2007 and 2009  . Combined abdominoplasty and liposuction  12/2014    "liposuction in thighs"  . Tibia fracture surgery Right 04/08/2015    ORIF tibial pilon  . Fracture surgery    . Laparoscopic cholecystectomy  2007  . Open reduction internal fixation (orif) tibia/fibula fracture Right 04/08/2015    Procedure: OPEN REDUCTION INTERNAL FIXATION (ORIF)  RIGHT TIBIA PILON FRACTURE;  Surgeon: Wylene Simmer, MD;  Location: [place];  Service: Orthopedics;  Laterality: Right;    There were no vitals filed for this visit.  Visit Diagnosis:  Ankle weakness  Stiffness due to immobility  Abnormal gait      Subjective Assessment - 09/26/15 0935    Subjective Patient states that feeling of stiffness and weakness in ankle limits her most when attempting to progress activities such as running. Tried a barre class at the gym  yesterday and felt like it challenged her the way she needs to be challenged right now. Noted muscle soreness after class but no pain.   How long can you stand comfortably? no limit   How long can you walk comfortably? 20 minutes   Patient Stated Goals be able walk and not have to stop every few minutes;  to hike and kick box; return to normal activities    Currently in Pain? 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   Leisure gym 5-7 days/wk machines, aerobic, kick-boxing classes    ROM / Strength   AROM / PROM / Strength AROM;Strength   AROM   AROM Assessment Site Ankle   Right/Left Ankle Right   Right Ankle Dorsiflexion 10   Right Ankle Plantar Flexion 70   Right Ankle Inversion 29   Right Ankle Eversion 25   Left Ankle Dorsiflexion 10   Strength   Strength Assessment Site Ankle   Right/Left Ankle Right   Right Ankle Dorsiflexion 5/5   Right Ankle Plantar Flexion 4/5  UE one  hand for balance heel raise x2   Right Ankle Inversion --  5-/5   Right Ankle Eversion 4+/5          TODAY'S TREATMENT  Recert - R ankle ROM/MMT, Goal assessment and update  TherEx Nustep - lvl 7 x 6' Rt ankle Prostretch DF stretch 3x30" each with knee straight and flexed Heel raises at back of UBE - Rt SL concentric/eccentric lowering 13x3"  DL Lateral line hop x20 DL Fwb/back line hop x20 Alt scissor hopping x10 TRX squat + heel raises x20 R lunge with L foot suspended in TRX 2x10 Side-stepping in partial squat with green TB around midfoot 2x55f, bil          PT Long Term Goals - 09/26/15 0949    PT LONG TERM GOAL #1   Title Increase AROM Rt ankle DF/inv/eversion to equal to Lt ankle (09/17/15)   Status Achieved   PT LONG TERM GOAL #2   Title  5-/5 to 5/5 strength Rt ankle (10/10/15)   Status On-going   PT LONG TERM GOAL #3   Title Normal gait pattern with equal weight bearing Rt/Lt LE's 08/28/15   Status Achieved   PT LONG TERM GOAL #4   Title Walk for 15-30 min with min to no limitations (10/10/15)   Status Partially Met  able to walk 20 min w/o limitation   PT LONG TERM GOAL #5   Title Improve FOTO to </=42% limitation (09/17/15)   Status Achieved   Additional Long Term Goals   Additional Long Term Goals Yes   PT LONG TERM GOAL #6   Title Patient able to participate in majority of activities within kick-boxing class without limitiation due to right ankle (10/10/15)   Status New               Plan - 09/26/15 1300    Clinical Impression Statement Patient has progressed well with PT with right ankle ROM now equivalent to left and over right ankle strength improved, but remains limited in PF and eversion. Demonstrating improving ability to ambulate with normal gait pattern for increased time/distance. She has started back to taking classes at the gym but has not yet been able to participate in kick-boxing classes as she had done prior to her fracture due to limited able to perform SLS and plyometric activities necessary, therefore will recert for 2 weeks to continue to address these deficits and allow return to PLOF.   Pt will benefit from skilled therapeutic intervention in order to improve on the following deficits Pain;Decreased mobility;Decreased strength;Increased fascial restricitons;Decreased activity tolerance;Decreased endurance;Decreased balance;Difficulty walking;Abnormal gait   PT Frequency 2x / week   PT Duration 2 weeks   PT Treatment/Interventions Patient/family education;ADLs/Self Care Home Management;Therapeutic exercise;Therapeutic activities;Cryotherapy;Electrical Stimulation;Moist Heat;Ultrasound;Neuromuscular re-education;Dry needling;Manual techniques;Taping   PT Next Visit Plan Rt LE strengthening, stability  training, progression of dynamic and plyometric training   Consulted and Agree with Plan of Care Patient        Problem List Patient Active Problem List   Diagnosis Date Noted  . PMDD (premenstrual dysphoric disorder) 07/29/2015  . Pilon fracture 04/08/2015  . Skin lesion 11/19/2014  . Preventative health care 10/29/2014  . Obesity 06/10/2014  . Chronic meniscal tear of knee 11/23/2013  . Lumbar pain with radiation down both legs 09/11/2013  . Generalized anxiety disorder 06/18/2013    JPercival Spanish PT, MPT 09/26/2015, 1:13 PM  [facility] [address]  [place], [place], [address] Phone: [phone]   Fax:  [phone]  Name: Gina James MRN: [id number] Date of Birth: 18-May-1981

## 2015-09-30 ENCOUNTER — Ambulatory Visit: Payer: BLUE CROSS/BLUE SHIELD | Admitting: Physical Therapy

## 2015-09-30 DIAGNOSIS — M2560 Stiffness of unspecified joint, not elsewhere classified: Secondary | ICD-10-CM

## 2015-09-30 DIAGNOSIS — M25571 Pain in right ankle and joints of right foot: Secondary | ICD-10-CM | POA: Diagnosis not present

## 2015-09-30 DIAGNOSIS — R29898 Other symptoms and signs involving the musculoskeletal system: Secondary | ICD-10-CM

## 2015-09-30 DIAGNOSIS — R269 Unspecified abnormalities of gait and mobility: Secondary | ICD-10-CM

## 2015-09-30 DIAGNOSIS — Z7409 Other reduced mobility: Secondary | ICD-10-CM

## 2015-09-30 NOTE — Therapy (Signed)
[facility] [address]  [place], [place], [address] Phone: [phone]   Fax:  [phone]  Physical Therapy Treatment  Patient Details  Name: Gina James MRN: [id number] Date of Birth: 1981-07-30 Referring Provider: Dr. Doran Durand  Encounter Date: 09/30/2015      PT End of Session - 09/30/15 0940    Visit Number 13   Number of Visits 16   Date for PT Re-Evaluation 10/10/15   PT Start Time 0930   PT Stop Time 1025   PT Time Calculation (min) 55 min   Activity Tolerance Patient tolerated treatment well;No increased pain;Patient limited by fatigue   Behavior During Therapy [facility] for tasks assessed/performed      Past Medical History  Diagnosis Date  . Anxiety   . Post partum depression   . Ankle fracture, right   . Complication of anesthesia     difficulty urinating after anesthesia  . Headache     "monthly" (04/08/2015)  . Arthritis     "in my back" (04/08/2015)  . Chronic lower back pain     Past Surgical History  Procedure Laterality Date  . Laparoscopic abdominal exploration  1997    "to check for endometriosis"  . Cesarean section  2007 and 2009  . Combined abdominoplasty and liposuction  12/2014    "liposuction in thighs"  . Tibia fracture surgery Right 04/08/2015    ORIF tibial pilon  . Fracture surgery    . Laparoscopic cholecystectomy  2007  . Open reduction internal fixation (orif) tibia/fibula fracture Right 04/08/2015    Procedure: OPEN REDUCTION INTERNAL FIXATION (ORIF)  RIGHT TIBIA PILON FRACTURE;  Surgeon: Wylene Simmer, MD;  Location: [place];  Service: Orthopedics;  Laterality: Right;    There were no vitals filed for this visit.  Visit Diagnosis:  Ankle weakness  Stiffness due to immobility  Abnormal gait  Pain, ankle, right      Subjective Assessment - 09/30/15 0934    Subjective  Patient states she tried the weight lifting class at the gym and was able to participate with most of  the exercises, but had difficulty with lunges and plyometric lunges. Notes some overall muscle soreness today and a "bruise-like" feeling in the dorsal asepct of her ankle but otherwise denies pain.   Currently in Pain? Yes   Pain Score 3    Pain Location Ankle   Pain Orientation Right;Anterior;Upper;Medial   Pain Descriptors / Indicators --  "Bruised"          TODAY'S TREATMENT  TherEx Nustep - lvl 7 x 6' R ankle Prostretch DF stretch 3x30"  Heel raises at back of UBE - Rt SL concentric/eccentric 15x3"  R SL Fwd step-up to 8" step with L hip flexion/knee extension with red TB x15 with intermittent 1 pole A R SL Fwd step-up to BOSU (up) with L hip flexion/knee extension with red TB x15 with 1 pole A Bil Fwd lunge with forward foot on BOSU (up) x20 each R SLS stabilization on blue foam oval with green TB x20 medial/lateral DL Fwb/back consecutive hop 2x8 DL Diagonal forward/lateral line hop x10 Alt scissor hopping 2x10 R SLS on BOSU (inverted) with vector reach fwd/side back x10 each with light 1 pole support R SLS on BOSU (inverted) with rebounder red (1000 Gr) med ball toss x7 (difficulty with aim while tossing)           PT Long Term Goals - 09/26/15 2863  PT LONG TERM GOAL #1   Title Increase AROM Rt ankle DF/inv/eversion to equal to Lt ankle (09/17/15)   Status Achieved   PT LONG TERM GOAL #2   Title 5-/5 to 5/5 strength Rt ankle (10/10/15)   Status On-going   PT LONG TERM GOAL #3   Title Normal gait pattern with equal weight bearing Rt/Lt LE's 08/28/15   Status Achieved   PT LONG TERM GOAL #4   Title Walk for 15-30 min with min to no limitations (10/10/15)   Status Partially Met  able to walk 20 min w/o limitation   PT LONG TERM GOAL #5   Title Improve FOTO to </=42% limitation (09/17/15)   Status Achieved   Additional Long Term Goals   Additional Long Term Goals Yes   PT LONG TERM GOAL #6   Title Patient able to participate in majority of activities within  kick-boxing class without limitiation due to right ankle (10/10/15)   Status New               Plan - 09/30/15 1016    Clinical Impression Statement Patient continues to be challenged by SLS activities on uneven surfaces. Tolerating progression of lunging activities with introduction on unstable surfaces and step-up/kicking activities with additon of theraband resistance.   PT Next Visit Plan Rt LE strengthening, stability training, progression of dynamic and plyometric training   Consulted and Agree with Plan of Care Patient        Problem List Patient Active Problem List   Diagnosis Date Noted  . PMDD (premenstrual dysphoric disorder) 07/29/2015  . Pilon fracture 04/08/2015  . Skin lesion 11/19/2014  . Preventative health care 10/29/2014  . Obesity 06/10/2014  . Chronic meniscal tear of knee 11/23/2013  . Lumbar pain with radiation down both legs 09/11/2013  . Generalized anxiety disorder 06/18/2013    Percival Spanish, PT, MPT 09/30/2015, 10:26 AM  [facility] [address]  [place], [place], [address] Phone: [phone]   Fax:  [phone]  Name: JERMEKA SCHLOTTERBECK MRN: [id number] Date of Birth: 12-Mar-1981

## 2015-10-03 ENCOUNTER — Ambulatory Visit: Payer: BLUE CROSS/BLUE SHIELD | Admitting: Physical Therapy

## 2015-10-03 DIAGNOSIS — M2560 Stiffness of unspecified joint, not elsewhere classified: Secondary | ICD-10-CM

## 2015-10-03 DIAGNOSIS — R29898 Other symptoms and signs involving the musculoskeletal system: Secondary | ICD-10-CM

## 2015-10-03 DIAGNOSIS — M25571 Pain in right ankle and joints of right foot: Secondary | ICD-10-CM | POA: Diagnosis not present

## 2015-10-03 DIAGNOSIS — R269 Unspecified abnormalities of gait and mobility: Secondary | ICD-10-CM

## 2015-10-03 DIAGNOSIS — Z7409 Other reduced mobility: Secondary | ICD-10-CM

## 2015-10-03 NOTE — Therapy (Signed)
[facility] [address]  [place], [place], [address] Phone: [phone]   Fax:  [phone]  Physical Therapy Treatment  Patient Details  Name: Gina James MRN: [id number] Date of Birth: Jul 16, 1981 Referring Provider: Dr. Doran Durand  Encounter Date: 10/03/2015      PT End of Session - 10/03/15 0942    Visit Number 14   Number of Visits 16   Date for PT Re-Evaluation 10/10/15   PT Start Time 0932   PT Stop Time 1031   PT Time Calculation (min) 59 min   Activity Tolerance Patient tolerated treatment well;No increased pain   Behavior During Therapy [facility] for tasks assessed/performed      Past Medical History  Diagnosis Date  . Anxiety   . Post partum depression   . Ankle fracture, right   . Complication of anesthesia     difficulty urinating after anesthesia  . Headache     "monthly" (04/08/2015)  . Arthritis     "in my back" (04/08/2015)  . Chronic lower back pain     Past Surgical History  Procedure Laterality Date  . Laparoscopic abdominal exploration  1997    "to check for endometriosis"  . Cesarean section  2007 and 2009  . Combined abdominoplasty and liposuction  12/2014    "liposuction in thighs"  . Tibia fracture surgery Right 04/08/2015    ORIF tibial pilon  . Fracture surgery    . Laparoscopic cholecystectomy  2007  . Open reduction internal fixation (orif) tibia/fibula fracture Right 04/08/2015    Procedure: OPEN REDUCTION INTERNAL FIXATION (ORIF)  RIGHT TIBIA PILON FRACTURE;  Surgeon: Wylene Simmer, MD;  Location: [place];  Service: Orthopedics;  Laterality: Right;    There were no vitals filed for this visit.  Visit Diagnosis:  Ankle weakness  Stiffness due to immobility  Abnormal gait  Pain, ankle, right      Subjective Assessment - 10/03/15 0938    Subjective Patient reports trying to carryover some of therapy exercises at home including SL heel raises on foam surface and lunges at home  and states pain increased up to 7/10 in medial ankle and lower leg. Took naproxen, iced and stretched with some relief of pain. Pain better today but still in same area.   Currently in Pain? Yes   Pain Score 3    Pain Location Ankle   Pain Orientation Right;Anterior;Medial;Upper         TODAY'S TREATMENT  TherEx Nustep - lvl 7 x 6' R ankle Prostretch DF stretch 2x30", repeated 3 times throughout session Side-stepping with heels on foam balance x3 passes bil Tandem walking on foam balance beam - Fwd/back x 5 passes DL minsquat stance on BOSU with ball toss Shallow lunge on foam balance beam, alternating forward foot SLS on foam balance beam with ball toss Rotational pivot in DL stance with alternating punch (kickboxing simulation) with left ankle tethered with red TB 3x30" Walking lunge with upper body rotation Lateral weight shift bounce on minitramp Staggered fwd/back weight shift bounce on minitramp, bil Static narrow BOS bounce on minitramp with eyes closed Staggered fwd/back weight shift bounce with left foot forward on minitramp with red (1000 Gr) med ball toss Lateral weight shift bounce on minitramp with red (1000 Gr) med ball toss R soleus stretch 2x30"  Vasoneumatic Compression to R ankle: Max cold / 15 min / Supine / Low compression / R LE elevated above heart on bolster.  PT Long Term Goals - 10/03/15 1217    PT LONG TERM GOAL #1   Title Increase AROM Rt ankle DF/inv/eversion to equal to Lt ankle (09/17/15)   Status Achieved   PT LONG TERM GOAL #2   Title 5-/5 to 5/5 strength Rt ankle (10/10/15)   Status On-going   PT LONG TERM GOAL #3   Title Normal gait pattern with equal weight bearing Rt/Lt LE's 08/28/15   Status Achieved   PT LONG TERM GOAL #4   Title Walk for 15-30 min with min to no limitations (10/10/15)   Status Partially Met   PT LONG TERM GOAL #5   Title Improve FOTO to </=42% limitation (09/17/15)   Status Achieved   PT LONG TERM GOAL #6    Title Patient able to participate in majority of activities within kick-boxing class without limitiation due to right ankle (10/10/15)   Status On-going               Plan - 10/03/15 1207    Clinical Impression Statement Pt. tolerated dynamic strengthening and flexibility training well showing increased stability at the R ankle.  Pt. reported increased pain level at home yesterday; however reported improved pain level following therex today; pt. continues to be cooperative and motivated.  Pt. demo'd understanding of therex with exception of mini tramp activities; verbal cues provided for correction of form and posture provided.  Pt. progressing toward all LTG's       PT Next Visit Plan Continue with R LE strengthening, stability training, progressing dynamic and plyometric training as tolerated.  Continue with POC.     Consulted and Agree with Plan of Care Patient        Problem List Patient Active Problem List   Diagnosis Date Noted  . PMDD (premenstrual dysphoric disorder) 07/29/2015  . Pilon fracture 04/08/2015  . Skin lesion 11/19/2014  . Preventative health care 10/29/2014  . Obesity 06/10/2014  . Chronic meniscal tear of knee 11/23/2013  . Lumbar pain with radiation down both legs 09/11/2013  . Generalized anxiety disorder 06/18/2013    Percival Spanish, PT, MPT 10/03/2015, 12:27 PM  [facility] [address]  [place], [place], [address] Phone: [phone]   Fax:  [phone]  Name: Gina James MRN: [id number] Date of Birth: 19-Jun-1981

## 2015-10-07 ENCOUNTER — Ambulatory Visit: Payer: BLUE CROSS/BLUE SHIELD | Admitting: Physical Therapy

## 2015-10-07 DIAGNOSIS — R29898 Other symptoms and signs involving the musculoskeletal system: Secondary | ICD-10-CM

## 2015-10-07 DIAGNOSIS — M2560 Stiffness of unspecified joint, not elsewhere classified: Secondary | ICD-10-CM

## 2015-10-07 DIAGNOSIS — Z7409 Other reduced mobility: Secondary | ICD-10-CM

## 2015-10-07 DIAGNOSIS — M25571 Pain in right ankle and joints of right foot: Secondary | ICD-10-CM

## 2015-10-07 DIAGNOSIS — R269 Unspecified abnormalities of gait and mobility: Secondary | ICD-10-CM

## 2015-10-07 NOTE — Therapy (Signed)
[facility] [address]  [place], [place], [address] Phone: [phone]   Fax:  [phone]  Physical Therapy Treatment  Patient Details  Name: Gina James MRN: [id number] Date of Birth: [birth year] Referring Provider: Dr. Doran Durand  Encounter Date: 10/07/2015      PT End of Session - 10/07/15 0936    Visit Number 15   Number of Visits 16   Date for PT Re-Evaluation 10/10/15   PT Start Time 0930   PT Stop Time 1016   PT Time Calculation (min) 46 min   Activity Tolerance Patient tolerated treatment well   Behavior During Therapy [facility] for tasks assessed/performed      Past Medical History  Diagnosis Date  . Anxiety   . Post partum depression   . Ankle fracture, right   . Complication of anesthesia     difficulty urinating after anesthesia  . Headache     "monthly" (04/08/2015)  . Arthritis     "in my back" (04/08/2015)  . Chronic lower back pain     Past Surgical History  Procedure Laterality Date  . Laparoscopic abdominal exploration  1997    "to check for endometriosis"  . Cesarean section  2007 and 2009  . Combined abdominoplasty and liposuction  12/2014    "liposuction in thighs"  . Tibia fracture surgery Right 04/08/2015    ORIF tibial pilon  . Fracture surgery    . Laparoscopic cholecystectomy  2007  . Open reduction internal fixation (orif) tibia/fibula fracture Right 04/08/2015    Procedure: OPEN REDUCTION INTERNAL FIXATION (ORIF)  RIGHT TIBIA PILON FRACTURE;  Surgeon: Wylene Simmer, MD;  Location: [place];  Service: Orthopedics;  Laterality: Right;    There were no vitals filed for this visit.  Visit Diagnosis:  Ankle weakness  Stiffness due to immobility  Abnormal gait  Pain, ankle, right      Subjective Assessment - 10/07/15 0934    Subjective Patient reports she went on a 2 mile hike yesterday on unpaved trails - notes some increased swelling afterward but no increased pain. Iced after  hike to reduce swelling. States she has been working with trainer at [employer], primarilly on core and proximal LE strengthening.   Currently in Pain? Yes   Pain Score 1    Pain Location Ankle   Pain Orientation Right;Anterior;Medial;Upper          TODAY'S TREATMENT  TherEx Elliptical - lvl 3 x 5' Heel walking 8 x 61f, interspersed intermittently throughout session Toe walking 4 x 268fSide-stepping on toes 4 x 2566fL minsquat stance on BOSU with ball toss Side-stepping on foam balance beam x 4 passes, bil Side-stepping with toes on foam balance beam x 3 passes, bil Tandem walking on foam balance beam - Fwd/back x 2 passes Side-stepping lunge on toes 2 x 26f41fil R ankle Prostretch DF stretch 2x30"  Standing in corner on blue foam Airex with eyes closed + perturbations, wide BOS initially progressing to narrow BOS, then bil staggered stance x30" each Tandem stance in corner on blue foam with eyes open x30" each Grapevine 2 x 26ft34fl BOSU (inverted) shallow squat 2x10 R SLS on blue foam Airex 3x30"           PT Long Term Goals - 10/03/15 1217    PT LONG TERM GOAL #1   Title Increase AROM Rt ankle DF/inv/eversion to equal to Lt ankle (09/17/15)   Status Achieved  PT LONG TERM GOAL #2   Title 5-/5 to 5/5 strength Rt ankle (10/10/15)   Status On-going   PT LONG TERM GOAL #3   Title Normal gait pattern with equal weight bearing Rt/Lt LE's 08/28/15   Status Achieved   PT LONG TERM GOAL #4   Title Walk for 15-30 min with min to no limitations (10/10/15)   Status Partially Met   PT LONG TERM GOAL #5   Title Improve FOTO to </=42% limitation (09/17/15)   Status Achieved   PT LONG TERM GOAL #6   Title Patient able to participate in majority of activities within kick-boxing class without limitiation due to right ankle (10/10/15)   Status On-going               Plan - 10/07/15 1027    Clinical Impression Statement Pt reports ankle continues to feel better after  therapy sessions with increased flexibility and decreased pain. Pt continues to tolerate progression on dynamic stabilty on unstable surfaces with varying BOS including SLS. Reporting ability to resume normal recreational activites including hiking and gym classes with some increased swelling (able to manage with icing) but without increased pain. Nearing end of existing POC and feel patient shoulde be ready to transition to HEP/gym program after next visit.   PT Next Visit Plan Assess goal status, Anticipate transition to HEP/gym program with possible 30 day hold   Consulted and Agree with Plan of Care Patient        Problem List Patient Active Problem List   Diagnosis Date Noted  . PMDD (premenstrual dysphoric disorder) 07/29/2015  . Pilon fracture 04/08/2015  . Skin lesion 11/19/2014  . Preventative health care 10/29/2014  . Obesity 06/10/2014  . Chronic meniscal tear of knee 11/23/2013  . Lumbar pain with radiation down both legs 09/11/2013  . Generalized anxiety disorder 06/18/2013    Percival Spanish, PT, MPT 10/07/2015, 10:36 AM  [facility] [address]  [place], [place], [address] Phone: [phone]   Fax:  [phone]  Name: Gina James MRN: [id number] Date of Birth: February 09, 1981

## 2015-10-10 ENCOUNTER — Ambulatory Visit: Payer: BLUE CROSS/BLUE SHIELD | Attending: Orthopedic Surgery | Admitting: Physical Therapy

## 2015-10-10 DIAGNOSIS — R269 Unspecified abnormalities of gait and mobility: Secondary | ICD-10-CM | POA: Diagnosis present

## 2015-10-10 DIAGNOSIS — M259 Joint disorder, unspecified: Secondary | ICD-10-CM | POA: Diagnosis present

## 2015-10-10 DIAGNOSIS — M2560 Stiffness of unspecified joint, not elsewhere classified: Secondary | ICD-10-CM

## 2015-10-10 DIAGNOSIS — M25571 Pain in right ankle and joints of right foot: Secondary | ICD-10-CM | POA: Diagnosis present

## 2015-10-10 DIAGNOSIS — R29898 Other symptoms and signs involving the musculoskeletal system: Secondary | ICD-10-CM

## 2015-10-10 DIAGNOSIS — M623 Immobility syndrome (paraplegic): Secondary | ICD-10-CM | POA: Diagnosis present

## 2015-10-10 DIAGNOSIS — Z7409 Other reduced mobility: Secondary | ICD-10-CM

## 2015-10-10 NOTE — Therapy (Signed)
[facility] [address]  [place], [place], [address] Phone: [phone]   Fax:  [phone]  Physical Therapy Treatment  Patient Details  Name: Gina James MRN: [id number] Date of Birth: [birth year] Referring Provider: Dr. Doran Durand  Encounter Date: 10/10/2015      PT End of Session - 10/10/15 0941    Visit Number 16   Number of Visits 16   Date for PT Re-Evaluation 10/10/15   PT Start Time 0932   PT Stop Time 1020   PT Time Calculation (min) 48 min   Activity Tolerance Patient tolerated treatment well   Behavior During Therapy [facility] for tasks assessed/performed      Past Medical History  Diagnosis Date  . Anxiety   . Post partum depression   . Ankle fracture, right   . Complication of anesthesia     difficulty urinating after anesthesia  . Headache     "monthly" (04/08/2015)  . Arthritis     "in my back" (04/08/2015)  . Chronic lower back pain     Past Surgical History  Procedure Laterality Date  . Laparoscopic abdominal exploration  1997    "to check for endometriosis"  . Cesarean section  2007 and 2009  . Combined abdominoplasty and liposuction  12/2014    "liposuction in thighs"  . Tibia fracture surgery Right 04/08/2015    ORIF tibial pilon  . Fracture surgery    . Laparoscopic cholecystectomy  2007  . Open reduction internal fixation (orif) tibia/fibula fracture Right 04/08/2015    Procedure: OPEN REDUCTION INTERNAL FIXATION (ORIF)  RIGHT TIBIA PILON FRACTURE;  Surgeon: Wylene Simmer, MD;  Location: [place];  Service: Orthopedics;  Laterality: Right;    There were no vitals filed for this visit.  Visit Diagnosis:  Ankle weakness  Stiffness due to immobility  Abnormal gait  Pain, ankle, right      Subjective Assessment - 10/10/15 0938    Subjective Patient denies pain today but still c/o stiffness. Able to complete housework yesterday with occasional breaks needed but no limitations due to ankle.  Completed boot camp class at gym the previous day and has completed yoga and pilates classes this week without issues.   How long can you sit comfortably? no limit   How long can you stand comfortably? no limit   How long can you walk comfortably? 30 minutes   Patient Stated Goals be able walk and not have to stop every few minutes;  to hike and kick box; return to normal activities    Currently in Pain? No/denies            [facility] PT Assessment - 10/10/15 0930    Observation/Other Assessments   Focus on Therapeutic Outcomes (FOTO)  Lower leg - 76% (24% limitation)   ROM / Strength   AROM / PROM / Strength AROM;Strength   AROM   AROM Assessment Site Ankle   Right/Left Hip --   Right/Left Ankle Right   Right Ankle Dorsiflexion 11   Right Ankle Plantar Flexion 69   Right Ankle Inversion 28   Right Ankle Eversion 22   Strength   Strength Assessment Site Ankle   Right/Left Ankle Right   Right Ankle Dorsiflexion 5/5   Right Ankle Plantar Flexion --  5-/5 (16 SL heel raises)   Right Ankle Inversion 5/5   Right Ankle Eversion 5/5           TODAY'S TREATMENT  ROM/MMT, Goal  Assessment  TherEx Elliptical - lvl 3 x 5' Gastroc, soleus & plantar facscia stretches x30" each Grapevine (weight primarily on toes) 2 x 74f, bil Sidestepping in partial squat with blue TB around midfoot 2 x 261f bil (green & blue TB provided for HEP) Doorway lunges alternating x20         PT Education - 10/10/15 1053    Education provided Yes   Education Details Summarized HEP provided at patient request. Patient able to demonstrate all exercises independently.   Person(s) Educated Patient   Methods Explanation;Demonstration;Handout   Comprehension Verbalized understanding;Returned demonstration             PT Long Term Goals - 10/10/15 0948    PT LONG TERM GOAL #1   Title Increase AROM Rt ankle DF/inv/eversion to equal to Lt ankle (09/17/15)   Status Achieved   PT LONG TERM GOAL  #2   Title 5-/5 to 5/5 strength Rt ankle (10/10/15)   Status Achieved   PT LONG TERM GOAL #3   Title Normal gait pattern with equal weight bearing Rt/Lt LE's 08/28/15   Status Achieved   PT LONG TERM GOAL #4   Title Walk for 15-30 min with min to no limitations (10/10/15)   Status Achieved   PT LONG TERM GOAL #5   Title Improve FOTO to </=42% limitation (09/17/15)   Status Achieved   PT LONG TERM GOAL #6   Title Patient able to participate in majority of activities within kick-boxing class without limitiation due to right ankle (10/10/15)   Status Achieved               Plan - 10/10/15 1044    Clinical Impression Statement Patient has demonstrated excellent progress with PT with full right ankle ROM restored and strength 5-/5 to 5/5. Patient able to complete all normal daily activites without restriction due to right ankle and has resumed all normal classes at the gym wtiht exception of kick-boxing class due to scheduling conflict with PT session but feels that she would be able to participate in these classes without problems. Patient is independent with HEP/gym program and is aware of how to progress activities on her own. No further skilled PT needs identified and all goals met for this episode, therefore will proceed with discharge from PT for this episode.   PT Next Visit Plan Discharge   Consulted and Agree with Plan of Care Patient        Problem List Patient Active Problem List   Diagnosis Date Noted  . PMDD (premenstrual dysphoric disorder) 07/29/2015  . Pilon fracture 04/08/2015  . Skin lesion 11/19/2014  . Preventative health care 10/29/2014  . Obesity 06/10/2014  . Chronic meniscal tear of knee 11/23/2013  . Lumbar pain with radiation down both legs 09/11/2013  . Generalized anxiety disorder 06/18/2013    JoPercival SpanishPT, MPT 10/10/2015, 10:56 AM  [facility] [address]one: [phone] Fax:  [phone]: Gina James: [id number] of Birth: [birth year] PHYSICAL THERAPY DISCHARGE SUMMARY  Visits from Start of Care: 16  Current functional level related to goals / functional outcomes:  Patient has demonstrated excellent progress with PT with full right ankle ROM restored and strength 5-/5 to 5/5. Patient able to complete all normal daily activites without restriction due to right ankle and has resumed all normal classes at the gym wtiht  exception of kick-boxing class due to scheduling conflict with PT session but feels that she would be able to participate in these classes without problems. Patient is independent with HEP/gym program and is aware of how to progress activities on her own. No further skilled PT needs identified and all goals met for this episode, therefore will proceed with discharge from PT for this episode.    Remaining deficits:  Stiffness with inactivity    Education / Equipment:  HEP/gym program  Plan: Patient agrees to discharge.  Patient goals were met. Patient is being discharged due to meeting the stated rehab goals.  ?????       Percival Spanish, PT, MPT 10/10/2015, 10:57 AM  [facility] [address]  [place], [place], [address] Phone: [phone]   Fax:  [phone]

## 2015-10-14 ENCOUNTER — Other Ambulatory Visit: Payer: Self-pay | Admitting: Neurosurgery

## 2015-10-14 DIAGNOSIS — M5136 Other intervertebral disc degeneration, lumbar region: Secondary | ICD-10-CM

## 2015-10-17 ENCOUNTER — Other Ambulatory Visit: Payer: Self-pay | Admitting: Neurosurgery

## 2015-10-17 ENCOUNTER — Ambulatory Visit
Admission: RE | Admit: 2015-10-17 | Discharge: 2015-10-17 | Disposition: A | Discharge status: Home / Self Care | Payer: BLUE CROSS/BLUE SHIELD | Source: Ambulatory Visit | Attending: Neurosurgery | Admitting: Neurosurgery

## 2015-10-17 DIAGNOSIS — M51369 Other intervertebral disc degeneration, lumbar region without mention of lumbar back pain or lower extremity pain: Secondary | ICD-10-CM

## 2015-10-17 DIAGNOSIS — M5136 Other intervertebral disc degeneration, lumbar region: Secondary | ICD-10-CM

## 2015-10-17 MED ORDER — METHYLPREDNISOLONE ACETATE 40 MG/ML INJ SUSP (RADIOLOG
120.0000 mg | Freq: Once | INTRAMUSCULAR | Status: AC
  Administered 2015-10-17: 120 mg via EPIDURAL

## 2015-10-17 MED ORDER — IOHEXOL 180 MG/ML  SOLN
1.0000 mL | Freq: Once | INTRAMUSCULAR | Status: AC | PRN
  Administered 2015-10-17: 1 mL via INTRAVENOUS

## 2015-10-17 NOTE — Discharge Instructions (Signed)

## 2015-11-19 ENCOUNTER — Encounter: Payer: Self-pay | Admitting: Family

## 2015-11-19 MED ORDER — METHOCARBAMOL 500 MG PO TABS: 500.0000 mg | ORAL_TABLET | Freq: Three times a day (TID) | ORAL | Status: DC | PRN

## 2015-11-19 NOTE — Telephone Encounter (Signed)
Robaxin requested Last seen 07/29/15 and filled 04/09/15 #30  Please advise    KP

## 2015-11-26 ENCOUNTER — Encounter: Payer: Self-pay | Admitting: Family

## 2015-11-26 ENCOUNTER — Ambulatory Visit (INDEPENDENT_AMBULATORY_CARE_PROVIDER_SITE_OTHER): Payer: BLUE CROSS/BLUE SHIELD | Admitting: Family

## 2015-11-26 VITALS — BP 112/78 | HR 72 | Temp 98.3°F | Resp 18 | Ht 64.5 in | Wt 221.0 lb

## 2015-11-26 DIAGNOSIS — F3281 Premenstrual dysphoric disorder: Secondary | ICD-10-CM | POA: Diagnosis not present

## 2015-11-26 NOTE — Progress Notes (Signed)
Subjective:    Patient ID: Gina James, female    DOB: Feb 25, 1981, 35 y.o.   MRN: [id number]  HPI   Gina James is a 35 yr old female who presents today for follow up of her PMDD.  Last visit we added seasonale. She reports that this helped dramatically. She then took 1 month off thinking that she had a better handle on her mood and had recurrent mood swings before her period.  She returned to OCP.  Notes side effects of mild weight gain, nausea and decreased libido.     Review of Systems See HPI  Past Medical History  Diagnosis Date  . Anxiety   . Post partum depression   . Ankle fracture, right   . Complication of anesthesia     difficulty urinating after anesthesia  . Headache     "monthly" (04/08/2015)  . Arthritis     "in my back" (04/08/2015)  . Chronic lower back pain     Social History   Social History  . Marital Status: Married    Spouse Name: N/A  . Number of Children: N/A  . Years of Education: N/A   Occupational History  . Not on file.   Social History Main Topics  . Smoking status: Former Smoker -- 0.12 packs/day for 10 years    Types: Cigarettes    Quit date: 09/06/2004  . Smokeless tobacco: Never Used  . Alcohol Use: 2.4 oz/week    4 Cans of beer per week     Comment: 04/08/2015 "drink only the weekends"  . Drug Use: No  . Sexual Activity: Yes     Comment: vasectomy in partner   Other Topics Concern  . Not on file   Social History Narrative   2 children- son 2007- Gina James,  Gina James 2009   Homemaker   Married   Completed tech school   Enjoys shopping, spending time with friends, reading.           Past Surgical History  Procedure Laterality Date  . Laparoscopic abdominal exploration  1997    "to check for endometriosis"  . Cesarean section  2007 and 2009  . Combined abdominoplasty and liposuction  12/2014    "liposuction in thighs"  . Tibia fracture surgery Right 04/08/2015    ORIF tibial pilon  . Fracture surgery    . Laparoscopic  cholecystectomy  2007  . Open reduction internal fixation (orif) tibia/fibula fracture Right 04/08/2015    Procedure: OPEN REDUCTION INTERNAL FIXATION (ORIF)  RIGHT TIBIA PILON FRACTURE;  Surgeon: Wylene Simmer, MD;  Location: [facility];  Service: Orthopedics;  Laterality: Right;    Family History  Problem Relation Age of Onset  . Diabetes Father   . Cancer Sister 30    breast, had chemo/double mastectomy  . Heart disease Maternal Grandfather   . Heart disease Paternal Grandfather     Allergies  Allergen Reactions  . Amoxicillin Hives  . Ceclor [Cefaclor] Hives  . Penicillins Hives  . Phenergan [Promethazine Hcl] Hives  . Sulfa Antibiotics Hives  . Zithromax [Azithromycin] Hives    Current Outpatient Prescriptions on File Prior to Visit  Medication Sig Dispense Refill  . levonorgestrel-ethinyl estradiol (SEASONALE,INTROVALE,JOLESSA) 0.15-0.03 MG tablet Take 1 tablet by mouth daily. 1 Package 4  . Multiple Vitamins-Minerals (MULTIVITAMIN WITH MINERALS) tablet Take 1 tablet by mouth daily.    . naproxen (NAPROSYN) 500 MG tablet Take 1 tablet (500 mg total) by mouth 2 (two) times daily with a meal. (Patient  taking differently: Take 500 mg by mouth 2 (two) times daily as needed. ) 60 tablet 2  . Omega-3 Fatty Acids (FISH OIL) 1000 MG CAPS Take 1,000 mg by mouth daily.    Marland Kitchen pyridOXINE (VITAMIN B-6) 100 MG tablet Take 1,000 mg by mouth daily.    Marland Kitchen venlafaxine XR (EFFEXOR-XR) 150 MG 24 hr capsule Take 1 capsule (150 mg total) by mouth daily. 30 capsule 5  . methocarbamol (ROBAXIN) 500 MG tablet Take 1 tablet (500 mg total) by mouth every 8 (eight) hours as needed for muscle spasms (spasms). (Patient not taking: Reported on 11/26/2015) 30 tablet 0   No current facility-administered medications on file prior to visit.    BP 112/78 mmHg  Pulse 72  Temp(Src) 98.3 F (36.8 C) (Oral)  Resp 18  Ht 5' 4.5" (1.638 m)  Wt 221 lb (100.245 kg)  BMI 37.36 kg/m2  SpO2 98%  LMP 11/19/2015         Objective:   Physical Exam  Constitutional: She is oriented to person, place, and time. She appears well-developed and well-nourished. No distress.  Neurological: She is alert and oriented to person, place, and time.  Psychiatric: She has a normal mood and affect. Her behavior is normal. Judgment and thought content normal.          Assessment & Plan:

## 2015-11-26 NOTE — Assessment & Plan Note (Signed)
Improved with OCP. We discussed her side effects and possibility of adding SSRI and d/cing the OCP. At this point, she wishes to continue ocp and will let me know if she  Changes her mind.

## 2015-11-26 NOTE — Progress Notes (Signed)
Pre visit review using our clinic review tool, if applicable. No additional management support is needed unless otherwise documented below in the visit note. 

## 2015-11-26 NOTE — Patient Instructions (Addendum)
Continue seasonale. Continue effexor.

## 2015-11-28 ENCOUNTER — Emergency Department (HOSPITAL_BASED_OUTPATIENT_CLINIC_OR_DEPARTMENT_OTHER)
Admission: EM | Admit: 2015-11-28 | Discharge: 2015-11-28 | Disposition: A | Discharge status: Home / Self Care | Payer: BLUE CROSS/BLUE SHIELD | Attending: Emergency Medicine | Admitting: Emergency Medicine

## 2015-11-28 ENCOUNTER — Encounter (HOSPITAL_BASED_OUTPATIENT_CLINIC_OR_DEPARTMENT_OTHER): Payer: Self-pay | Admitting: Emergency Medicine

## 2015-11-28 DIAGNOSIS — Z8781 Personal history of (healed) traumatic fracture: Secondary | ICD-10-CM | POA: Insufficient documentation

## 2015-11-28 DIAGNOSIS — M1990 Unspecified osteoarthritis, unspecified site: Secondary | ICD-10-CM | POA: Diagnosis not present

## 2015-11-28 DIAGNOSIS — F419 Anxiety disorder, unspecified: Secondary | ICD-10-CM | POA: Diagnosis not present

## 2015-11-28 DIAGNOSIS — Z87891 Personal history of nicotine dependence: Secondary | ICD-10-CM | POA: Insufficient documentation

## 2015-11-28 DIAGNOSIS — G8929 Other chronic pain: Secondary | ICD-10-CM | POA: Diagnosis not present

## 2015-11-28 DIAGNOSIS — Z79818 Long term (current) use of other agents affecting estrogen receptors and estrogen levels: Secondary | ICD-10-CM | POA: Diagnosis not present

## 2015-11-28 DIAGNOSIS — R339 Retention of urine, unspecified: Secondary | ICD-10-CM | POA: Diagnosis not present

## 2015-11-28 DIAGNOSIS — R300 Dysuria: Secondary | ICD-10-CM | POA: Diagnosis present

## 2015-11-28 NOTE — ED Notes (Signed)
Pt reports unable to void since having breast surgery 11/27/2015 by Dr Lenon Curt pt reports low abd discomfort and pain

## 2015-11-28 NOTE — ED Notes (Signed)
Pt states unable to urinate since surgery yesterday am

## 2015-11-28 NOTE — ED Provider Notes (Signed)
CSN: [id number]     Arrival date & time 11/28/15  [id number] History   First MD Initiated Contact with Patient 11/28/15 [phone]     Chief Complaint  Patient presents with  . Dysuria     (Consider location/radiation/quality/duration/timing/severity/associated sxs/prior Treatment) HPI   Gina James is a 35 y.o. female with past medical history of breast augmentation and lipo suction performed yesterday here with inability to urinate. Patient has had 2 surgeries in the past with this happened as well. She has not urinated since yesterday at 9 AM. She is having distention and pain in her abdomen. She denies any fevers, nausea, vomiting. She has not appointment to see her surgeon in 6 hours. There are no further complaints.  10 Systems reviewed and are negative for acute change except as noted in the HPI.     Past Medical History  Diagnosis Date  . Anxiety   . Post partum depression   . Ankle fracture, right   . Complication of anesthesia     difficulty urinating after anesthesia  . Headache     "monthly" (04/08/2015)  . Arthritis     "in my back" (04/08/2015)  . Chronic lower back pain    Past Surgical History  Procedure Laterality Date  . Laparoscopic abdominal exploration  1997    "to check for endometriosis"  . Cesarean section  2007 and 2009  . Combined abdominoplasty and liposuction  12/2014    "liposuction in thighs"  . Tibia fracture surgery Right 04/08/2015    ORIF tibial pilon  . Fracture surgery    . Laparoscopic cholecystectomy  2007  . Open reduction internal fixation (orif) tibia/fibula fracture Right 04/08/2015    Procedure: OPEN REDUCTION INTERNAL FIXATION (ORIF)  RIGHT TIBIA PILON FRACTURE;  Surgeon: Wylene Simmer, MD;  Location: [facility];  Service: Orthopedics;  Laterality: Right;   Family History  Problem Relation Age of Onset  . Diabetes Father   . Cancer Sister 30    breast, had chemo/double mastectomy  . Heart disease Maternal Grandfather   . Heart disease Paternal  Grandfather    Social History  Substance Use Topics  . Smoking status: Former Smoker -- 0.12 packs/day for 10 years    Types: Cigarettes    Quit date: 09/06/2004  . Smokeless tobacco: Never Used  . Alcohol Use: 2.4 oz/week    4 Cans of beer per week     Comment: 04/08/2015 "drink only the weekends"   OB History    No data available     Review of Systems    Allergies  Amoxicillin; Ceclor; Penicillins; Phenergan; Sulfa antibiotics; and Zithromax  Home Medications   Prior to Admission medications   Medication Sig Start Date End Date Taking? Authorizing Provider  levonorgestrel-ethinyl estradiol (SEASONALE,INTROVALE,JOLESSA) 0.15-0.03 MG tablet Take 1 tablet by mouth daily. 07/29/15   Debbrah Alar, NP  methocarbamol (ROBAXIN) 500 MG tablet Take 1 tablet (500 mg total) by mouth every 8 (eight) hours as needed for muscle spasms (spasms). Patient not taking: Reported on 11/26/2015 11/19/15   Debbrah Alar, NP  Multiple Vitamins-Minerals (MULTIVITAMIN WITH MINERALS) tablet Take 1 tablet by mouth daily.    Historical Provider, MD  naproxen (NAPROSYN) 500 MG tablet Take 1 tablet (500 mg total) by mouth 2 (two) times daily with a meal. Patient taking differently: Take 500 mg by mouth 2 (two) times daily as needed.  04/09/15 04/08/16  Corky Sing, PA-C  Omega-3 Fatty Acids (FISH OIL) 1000 MG CAPS Take 1,000  mg by mouth daily.    Historical Provider, MD  OVER THE COUNTER MEDICATION TUMERIC.  Take 2 capsules daily.    Historical Provider, MD  pyridOXINE (VITAMIN B-6) 100 MG tablet Take 1,000 mg by mouth daily.    Historical Provider, MD  venlafaxine XR (EFFEXOR-XR) 150 MG 24 hr capsule Take 1 capsule (150 mg total) by mouth daily. 07/29/15   Debbrah Alar, NP   BP 139/79 mmHg  Pulse 109  Temp(Src) 98.7 F (37.1 C) (Oral)  Resp 18  SpO2 99%  LMP 11/19/2015 Physical Exam  Constitutional: She is oriented to person, place, and time. She appears well-developed and  well-nourished. No distress.  HENT:  Head: Normocephalic and atraumatic.  Nose: Nose normal.  Mouth/Throat: Oropharynx is clear and moist. No oropharyngeal exudate.  Eyes: Conjunctivae and EOM are normal. Pupils are equal, round, and reactive to light. No scleral icterus.  Neck: Normal range of motion. Neck supple. No JVD present. No tracheal deviation present. No thyromegaly present.  Cardiovascular: Normal rate, regular rhythm and normal heart sounds.  Exam reveals no gallop and no friction rub.   No murmur heard. Pulmonary/Chest: Effort normal and breath sounds normal. No respiratory distress. She has no wheezes. She exhibits no tenderness.  Abdominal: Soft. Bowel sounds are normal. She exhibits no distension and no mass. There is no tenderness. There is no rebound and no guarding.  Abdominal exam is limited due to 3 tight binder was applied postop. Per patient, surgeon instructed not to remove these.  Musculoskeletal: Normal range of motion. She exhibits no edema or tenderness.  Lymphadenopathy:    She has no cervical adenopathy.  Neurological: She is alert and oriented to person, place, and time. No cranial nerve deficit. She exhibits normal muscle tone.  Skin: Skin is warm and dry. No rash noted. No erythema. No pallor.  Nursing note and vitals reviewed.   ED Course  Procedures (including critical care time) Labs Review Labs Reviewed - No data to display  Imaging Review No results found. I have personally reviewed and evaluated these images and lab results as part of my medical decision-making.   EKG Interpretation None      MDM   Final diagnoses:  Urinary retention  Patient presents emergency department for urinary retention after general anesthesia. This is a common complication of anesthesia. Will Place Foley catheter as the patient follow-up with her surgeon during her appointment later today. Patient is very familiar with this process and demonstrates good  understanding. She appears well and in no acute distress, vital signs were within her normal limits and she is safe for discharge.  Everlene Balls, MD 11/28/15 [phone]

## 2015-11-28 NOTE — Discharge Instructions (Signed)
Acute Urinary Retention, Female Ms. Ussery, you likely cannot urinate due to the anesthesia given to you for surgery. Wear the Foley until you see your surgeon for close follow-up later today. If any symptoms worsen, come back to emergency department immediately. Thank you. Urinary retention means you are unable to pee completely or at all (empty your bladder). HOME CARE  Drink enough fluids to keep your pee (urine) clear or pale yellow.  If you are sent home with a tube that drains the bladder (catheter), there will be a drainage bag attached to it. There are two types of bags. One is big that you can wear at night without having to empty it. One is smaller and needs to be emptied more often.  Keep the drainage bag emptied.  Keep the drainage bag lower than the tube.  Only take medicine as told by your doctor. GET HELP IF:  You have a low-grade fever.  You have spasms or you are leaking pee when you have spasms. GET HELP RIGHT AWAY IF:   You have chills or a fever.  Your catheter stops draining pee.  Your catheter falls out.  You have increased bleeding that does not stop after you have rested and increased the amount of fluids you had been drinking. MAKE SURE YOU:   Understand these instructions.  Will watch your condition.  Will get help right away if you are not doing well or get worse.   This information is not intended to replace advice given to you by your health care provider. Make sure you discuss any questions you have with your health care provider.   Document Released: 02/09/2008 Document Revised: 01/07/2015 Document Reviewed: 02/01/2013 Elsevier Interactive Patient Education [employer].

## 2015-12-17 DIAGNOSIS — R269 Unspecified abnormalities of gait and mobility: Secondary | ICD-10-CM | POA: Diagnosis not present

## 2016-01-16 DIAGNOSIS — R269 Unspecified abnormalities of gait and mobility: Secondary | ICD-10-CM | POA: Diagnosis not present

## 2016-01-26 ENCOUNTER — Encounter: Payer: Self-pay | Admitting: Family

## 2016-01-26 MED ORDER — VENLAFAXINE HCL ER 150 MG PO CP24: 150.0000 mg | ORAL_CAPSULE | Freq: Every day | ORAL | Status: DC

## 2016-02-26 ENCOUNTER — Encounter: Payer: Self-pay | Admitting: Family

## 2016-02-26 ENCOUNTER — Other Ambulatory Visit: Payer: Self-pay | Admitting: Family

## 2016-03-01 MED ORDER — LEVONORGEST-ETH ESTRAD 91-DAY 0.15-0.03 MG PO TABS: 1.0000 | ORAL_TABLET | Freq: Every day | ORAL | Status: DC

## 2016-03-01 NOTE — Telephone Encounter (Signed)
Last methocarbamol Rx: 11/2015, #30 Next OV: 03/04/16  Please advise request?

## 2016-03-02 MED ORDER — METHOCARBAMOL 500 MG PO TABS: 500.0000 mg | ORAL_TABLET | Freq: Three times a day (TID) | ORAL | Status: DC | PRN

## 2016-03-02 NOTE — Addendum Note (Signed)
Addended by: Debbrah Alar on: 03/02/2016 10:59 AM   Modules accepted: Orders

## 2016-03-04 ENCOUNTER — Ambulatory Visit (INDEPENDENT_AMBULATORY_CARE_PROVIDER_SITE_OTHER): Payer: BLUE CROSS/BLUE SHIELD | Admitting: Family

## 2016-03-04 ENCOUNTER — Encounter: Payer: Self-pay | Admitting: Family

## 2016-03-04 VITALS — BP 110/76 | HR 70 | Temp 97.6°F | Resp 16 | Ht 65.0 in | Wt 221.6 lb

## 2016-03-04 DIAGNOSIS — N6001 Solitary cyst of right breast: Secondary | ICD-10-CM

## 2016-03-04 MED ORDER — DOXYCYCLINE HYCLATE 100 MG PO TABS: 100.0000 mg | ORAL_TABLET | Freq: Two times a day (BID) | ORAL | Status: DC

## 2016-03-04 NOTE — Patient Instructions (Signed)
Start doxycycline for cyst. Apply warm compress twice daily.

## 2016-03-04 NOTE — Progress Notes (Signed)
Subjective:    Patient ID: Gina James, female    DOB: Apr 08, 1981, 35 y.o.   MRN: [id number]  HPI  Gina James is a 35 yr old female who presents today with right breast cyst. Reports cyst has been present x 1 month. Area is associated with tenderness. Denies associated drainage from the cyst.   Review of Systems See HPI  Past Medical History  Diagnosis Date  . Anxiety   . Post partum depression   . Ankle fracture, right   . Complication of anesthesia     difficulty urinating after anesthesia  . Headache     "monthly" (04/08/2015)  . Arthritis     "in my back" (04/08/2015)  . Chronic lower back pain      Social History   Social History  . Marital Status: Married    Spouse Name: N/A  . Number of Children: N/A  . Years of Education: N/A   Occupational History  . Not on file.   Social History Main Topics  . Smoking status: Former Smoker -- 0.12 packs/day for 10 years    Types: Cigarettes    Quit date: 09/06/2004  . Smokeless tobacco: Never Used  . Alcohol Use: 2.4 oz/week    4 Cans of beer per week     Comment: 04/08/2015 "drink only the weekends"  . Drug Use: No  . Sexual Activity: Yes     Comment: vasectomy in partner   Other Topics Concern  . Not on file   Social History Narrative   2 children- son 2007- Sean,  Taryn 2009   Homemaker   Married   Completed tech school   Enjoys shopping, spending time with friends, reading.           Past Surgical History  Procedure Laterality Date  . Laparoscopic abdominal exploration  1997    "to check for endometriosis"  . Cesarean section  2007 and 2009  . Combined abdominoplasty and liposuction  12/2014    "liposuction in thighs"  . Tibia fracture surgery Right 04/08/2015    ORIF tibial pilon  . Fracture surgery    . Laparoscopic cholecystectomy  2007  . Open reduction internal fixation (orif) tibia/fibula fracture Right 04/08/2015    Procedure: OPEN REDUCTION INTERNAL FIXATION (ORIF)  RIGHT TIBIA PILON FRACTURE;   Surgeon: Wylene Simmer, MD;  Location: [facility];  Service: Orthopedics;  Laterality: Right;    Family History  Problem Relation Age of Onset  . Diabetes Father   . Cancer Sister 30    breast, had chemo/double mastectomy  . Heart disease Maternal Grandfather   . Heart disease Paternal Grandfather     Allergies  Allergen Reactions  . Amoxicillin Hives  . Ceclor [Cefaclor] Hives  . Penicillins Hives  . Phenergan [Promethazine Hcl] Hives  . Sulfa Antibiotics Hives  . Zithromax [Azithromycin] Hives    Current Outpatient Prescriptions on File Prior to Visit  Medication Sig Dispense Refill  . levonorgestrel-ethinyl estradiol (SEASONALE,INTROVALE,JOLESSA) 0.15-0.03 MG tablet Take 1 tablet by mouth daily. 1 Package 4  . methocarbamol (ROBAXIN) 500 MG tablet Take 1 tablet (500 mg total) by mouth every 8 (eight) hours as needed for muscle spasms (spasms). 30 tablet 0  . Multiple Vitamins-Minerals (MULTIVITAMIN WITH MINERALS) tablet Take 1 tablet by mouth daily.    . naproxen (NAPROSYN) 500 MG tablet Take 1 tablet (500 mg total) by mouth 2 (two) times daily with a meal. (Patient taking differently: Take 500 mg by mouth 2 (two)  times daily as needed. ) 60 tablet 2  . OVER THE COUNTER MEDICATION TUMERIC.  Take 2 capsules daily.    Marland Kitchen pyridOXINE (VITAMIN B-6) 100 MG tablet Take 1,000 mg by mouth daily.    Marland Kitchen venlafaxine XR (EFFEXOR-XR) 150 MG 24 hr capsule Take 1 capsule (150 mg total) by mouth daily. 30 capsule 5   No current facility-administered medications on file prior to visit.    BP 110/76 mmHg  Pulse 70  Temp(Src) 97.6 F (36.4 C) (Oral)  Resp 16  Ht 5\' 5"  (1.651 m)  Wt 221 lb 9.6 oz (100.517 kg)  BMI 36.88 kg/m2  SpO2 97%       Objective:   Physical Exam  Constitutional: She is oriented to person, place, and time. She appears well-developed and well-nourished.  HENT:  Head: Normocephalic and atraumatic.  Musculoskeletal: She exhibits no edema.  Neurological: She is alert and  oriented to person, place, and time.  Skin:     Superficial pea sized mobile slightly tender cyst noted at top or right breast. Some mild surrounding erythema is noted.   Psychiatric: She has a normal mood and affect. Her behavior is normal. Judgment and thought content normal.          Assessment & Plan:  Breast cyst- advised pt to begin doxycycline, apply warm compresses twice daily. Follow up in 1 week. If not improved consider breast imaging.

## 2016-03-04 NOTE — Progress Notes (Signed)
Pre visit review using our clinic review tool, if applicable. No additional management support is needed unless otherwise documented below in the visit note. 

## 2016-03-08 ENCOUNTER — Telehealth: Payer: Self-pay | Admitting: Family

## 2016-03-23 ENCOUNTER — Ambulatory Visit: Payer: BLUE CROSS/BLUE SHIELD | Admitting: Family

## 2016-03-24 NOTE — Telephone Encounter (Signed)
Completed.

## 2016-04-07 ENCOUNTER — Ambulatory Visit (INDEPENDENT_AMBULATORY_CARE_PROVIDER_SITE_OTHER): Payer: BLUE CROSS/BLUE SHIELD | Admitting: Family

## 2016-04-07 ENCOUNTER — Encounter: Payer: Self-pay | Admitting: Family

## 2016-04-07 DIAGNOSIS — B009 Herpesviral infection, unspecified: Secondary | ICD-10-CM | POA: Diagnosis not present

## 2016-04-07 MED ORDER — VALACYCLOVIR HCL 500 MG PO TABS: 500.0000 mg | ORAL_TABLET | Freq: Every day | ORAL | 5 refills | Status: DC

## 2016-04-07 NOTE — Progress Notes (Signed)
Subjective:    Patient ID: Gina James, female    DOB: [birth year], 35 y.o.   MRN: [id number]  HPI  Gina James is a 35 yr old female who presents today to discuss genital rash. Rash has been present x 2 days.  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  Initial outbreak was "like 100" lesions and was painful. Pt reports that these lesions resolved. Reports that she had testing performed in July which included HIV testing which was reportedly negative.  Reports that she and her husband have an open marriage and she had a new partner prior to the outbreak. Husband has not had any symptoms.    Review of Systems See HPI  Past Medical History:  Diagnosis Date  . Ankle fracture, right   . Anxiety   . Arthritis    "in my back" (04/08/2015)  . Chronic lower back pain   . Complication of anesthesia    difficulty urinating after anesthesia  . Headache    "monthly" (04/08/2015)  . Post partum depression      Social History   Social History  . Marital status: Married    Spouse name: N/A  . Number of children: N/A  . Years of education: N/A   Occupational History  . Not on file.   Social History Main Topics  . Smoking status: Former Smoker    Packs/day: 0.12    Years: 10.00    Types: Cigarettes    Quit date: 09/06/2004  . Smokeless tobacco: Never Used  . Alcohol use 2.4 oz/week    4 Cans of beer per week     Comment: 04/08/2015 "drink only the weekends"  . Drug use: No  . Sexual activity: Yes     Comment: vasectomy in partner   Other Topics Concern  . Not on file   Social History Narrative   2 children- son 2007- Gina James,  Gina James 2009   Homemaker   Married   Completed tech school   Enjoys shopping, spending time with friends, reading.           Past Surgical History:  Procedure Laterality Date  . CESAREAN SECTION  2007 and 2009  . COMBINED ABDOMINOPLASTY AND LIPOSUCTION  12/2014   "liposuction in thighs"  . FRACTURE SURGERY    .  LAPAROSCOPIC ABDOMINAL EXPLORATION  1997   "to check for endometriosis"  . LAPAROSCOPIC CHOLECYSTECTOMY  2007  . OPEN REDUCTION INTERNAL FIXATION (ORIF) TIBIA/FIBULA FRACTURE Right 04/08/2015   Procedure: OPEN REDUCTION INTERNAL FIXATION (ORIF)  RIGHT TIBIA PILON FRACTURE;  Surgeon: Wylene Simmer, MD;  Location: [facility];  Service: Orthopedics;  Laterality: Right;  . TIBIA FRACTURE SURGERY Right 04/08/2015   ORIF tibial pilon    Family History  Problem Relation Age of Onset  . Diabetes Father   . Cancer Sister 30    breast, had chemo/double mastectomy  . Heart disease Maternal Grandfather   . Heart disease Paternal Grandfather     Allergies  Allergen Reactions  . Amoxicillin Hives  . Ceclor [Cefaclor] Hives  . Penicillins Hives  . Phenergan [Promethazine Hcl] Hives  . Sulfa Antibiotics Hives  . Zithromax [Azithromycin] Hives    Current Outpatient Prescriptions on File Prior to Visit  Medication Sig Dispense Refill  . levonorgestrel-ethinyl estradiol (SEASONALE,INTROVALE,JOLESSA) 0.15-0.03 MG tablet Take 1 tablet by mouth daily. 1 Package 4  . methocarbamol (ROBAXIN) 500 MG tablet Take 1 tablet (500 mg total) by mouth every 8 (  eight) hours as needed for muscle spasms (spasms). 30 tablet 0  . Multiple Vitamins-Minerals (MULTIVITAMIN WITH MINERALS) tablet Take 1 tablet by mouth daily.    . naproxen (NAPROSYN) 500 MG tablet Take 1 tablet (500 mg total) by mouth 2 (two) times daily with a meal. (Patient taking differently: Take 500 mg by mouth 2 (two) times daily as needed. ) 60 tablet 2  . OVER THE COUNTER MEDICATION TUMERIC.  Take 2 capsules daily.    Marland Kitchen pyridOXINE (VITAMIN B-6) 100 MG tablet Take 1,000 mg by mouth daily.    Marland Kitchen venlafaxine XR (EFFEXOR-XR) 150 MG 24 hr capsule Take 1 capsule (150 mg total) by mouth daily. 30 capsule 5   No current facility-administered medications on file prior to visit.     BP 102/60   Temp 98.6 F (37 C) (Oral)   Ht 5\' 5"  (1.651 m)   Wt 216 lb (98 kg)    BMI 35.94 kg/m       Objective:   Physical Exam  Constitutional: She appears well-developed and well-nourished.  HENT:  Head: Normocephalic and atraumatic.  Right Ear: Tympanic membrane and ear canal normal.  Left Ear: Tympanic membrane and ear canal normal.  Genitourinary:     Genitourinary Comments: Shallow ulcerated lesion noted on labia          Assessment & Plan:

## 2016-04-07 NOTE — Progress Notes (Signed)
Pre visit review using our clinic review tool, if applicable. No additional management support is needed unless otherwise documented below in the visit note. 

## 2016-04-07 NOTE — Patient Instructions (Addendum)
Please take valtrex one tab twice daily for 3 days, then continue 1 tab once daily for suppression. Use condoms all the time. Call if recurrent outbreaks while on the suppressive therapy.

## 2016-04-07 NOTE — Assessment & Plan Note (Signed)
Will rx with valtrex 500mg  bid x 3 days for outbreak, then switch over to a once daily suppressive regimen.  Pt is counseled on importance of regular condom use.

## 2016-05-28 DIAGNOSIS — M5136 Other intervertebral disc degeneration, lumbar region: Secondary | ICD-10-CM | POA: Diagnosis not present

## 2016-07-14 ENCOUNTER — Encounter: Payer: Self-pay | Admitting: Family

## 2016-07-14 ENCOUNTER — Ambulatory Visit (INDEPENDENT_AMBULATORY_CARE_PROVIDER_SITE_OTHER): Payer: BLUE CROSS/BLUE SHIELD | Admitting: Family

## 2016-07-14 VITALS — BP 144/92 | HR 65 | Temp 98.4°F | Resp 18 | Ht 64.0 in | Wt 214.6 lb

## 2016-07-14 DIAGNOSIS — Z23 Encounter for immunization: Secondary | ICD-10-CM

## 2016-07-14 DIAGNOSIS — F418 Other specified anxiety disorders: Secondary | ICD-10-CM

## 2016-07-14 MED ORDER — ESCITALOPRAM OXALATE 10 MG PO TABS: ORAL_TABLET | ORAL | 0 refills | Status: DC

## 2016-07-14 NOTE — Patient Instructions (Addendum)
Please begin lexapro 10mg  1/2 tablet by mouth once daily for 1 week, then increase to a full table once daily on week two. Continue effexor.  If you have recurrent thoughts of hurting yourself please contact the suicide hotline then go to the Emergency Room.  National suicide prevention hotline- 854 194 4927.   Please contact psychiatry to schedule an appointment. Psychiatric Services:  [facility], [place] [address] [place], [address] [place], [place] [facility] [id number] [place], [place], [place], [address], [place], [place] health to schedule an appointment with one of our counselors:  [phone]

## 2016-07-14 NOTE — Progress Notes (Signed)
Pre visit review using our clinic review tool, if applicable. No additional management support is needed unless otherwise documented below in the visit note. 

## 2016-07-14 NOTE — Progress Notes (Signed)
Subjective:    Patient ID: RIANA COLLINSON, female    DOB: 07/14/1981, 35 y.o.   MRN: [id number]  HPI  Ms. Giambra is a 35 yr old female who presents today to discuss anxiety. She reports that she has had increased anxiety concerns x 2 months. Notes that she has panic attacks where she feels sob and has trouble concentrating.Overall mood is very irritable.  Not sleeping well. Notes that she and her husband are in the process of separating. This process began in July of this year. She states that she and her husband are still living in the home. Her lawyer has advised her not to move out.  She states that her husband has cut her off financially. 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  She denies current suicidal ideation, however she does report that she has had thoughts of suicide over the past weeks.  She states that she has thought about "parking my car in the garage."   Hypoglycemia- She reports that it feels like when she eats her sugar "plummets so fast." She reports that she develops dizziness/shakiness if she does not eat. Reports that she tends to have these symptoms 2 hrs after a meal.   Review of Systems See HPI  Past Medical History:  Diagnosis Date  . Ankle fracture, right   . Anxiety   . Arthritis    "in my back" (04/08/2015)  . Chronic lower back pain   . Complication of anesthesia    difficulty urinating after anesthesia  . Headache    "monthly" (04/08/2015)  . Post partum depression      Social History   Social History  . Marital status: Married    Spouse name: N/A  . Number of children: N/A  . Years of education: N/A   Occupational History  . Not on file.   Social History Main Topics  . Smoking status: Former Smoker    Packs/day: 0.12    Years: 10.00    Types: Cigarettes    Quit date: 09/06/2004  . Smokeless tobacco: Never Used  . Alcohol use 2.4 oz/week    4 Cans of beer per week     Comment:  04/08/2015 "drink only the weekends"  . Drug use: No  . Sexual activity: Yes     Comment: vasectomy in partner   Other Topics Concern  . 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           Past Surgical History:  Procedure Laterality Date  . CESAREAN SECTION  2007 and 2009  . COMBINED ABDOMINOPLASTY AND LIPOSUCTION  12/2014   "liposuction in thighs"  . FRACTURE SURGERY    . LAPAROSCOPIC ABDOMINAL EXPLORATION  1997   "to check for endometriosis"  . LAPAROSCOPIC CHOLECYSTECTOMY  2007  . OPEN REDUCTION INTERNAL FIXATION (ORIF) TIBIA/FIBULA FRACTURE Right 04/08/2015   Procedure: OPEN REDUCTION INTERNAL FIXATION (ORIF)  RIGHT TIBIA PILON FRACTURE;  Surgeon: Wylene Simmer, MD;  Location: [facility];  Service: Orthopedics;  Laterality: Right;  . TIBIA FRACTURE SURGERY Right 04/08/2015   ORIF tibial pilon    Family History  Problem Relation Age of Onset  . Diabetes Father   . Cancer Sister 61  breast, had chemo/double mastectomy  . Heart disease Maternal Grandfather   . Heart disease Paternal Grandfather     Allergies  Allergen Reactions  . Amoxicillin Hives  . Ceclor [Cefaclor] Hives  . Penicillins Hives  . Phenergan [Promethazine Hcl] Hives  . Sulfa Antibiotics Hives  . Zithromax [Azithromycin] Hives    Current Outpatient Prescriptions on File Prior to Visit  Medication Sig Dispense Refill  . L-FORMULA LYSINE HCL PO Take 1 tablet by mouth daily.    Marland Kitchen levonorgestrel-ethinyl estradiol (SEASONALE,INTROVALE,JOLESSA) 0.15-0.03 MG tablet Take 1 tablet by mouth daily. 1 Package 4  . methocarbamol (ROBAXIN) 500 MG tablet Take 1 tablet (500 mg total) by mouth every 8 (eight) hours as needed for muscle spasms (spasms). 30 tablet 0  . Multiple Vitamins-Minerals (MULTIVITAMIN WITH MINERALS) tablet Take 1 tablet by  mouth daily.    Marland Kitchen OVER THE COUNTER MEDICATION TUMERIC.  Take 2 capsules daily.    Marland Kitchen pyridOXINE (VITAMIN B-6) 100 MG tablet Take 1,000 mg by mouth daily.    . valACYclovir (VALTREX) 500 MG tablet Take 1 tablet (500 mg total) by mouth daily. 30 tablet 5  . venlafaxine XR (EFFEXOR-XR) 150 MG 24 hr capsule Take 1 capsule (150 mg total) by mouth daily. 30 capsule 5   No current facility-administered medications on file prior to visit.     BP (!) 144/92 (BP Location: Left Arm, Patient Position: Sitting, Cuff Size: Large)   Pulse 65   Temp 98.4 F (36.9 C) (Oral)   Resp 18   Ht 5\' 4"  (1.626 m)   Wt 214 lb 9.6 oz (97.3 kg)   SpO2 100% Comment: RA  BMI 36.84 kg/m       Objective:   Physical Exam  Constitutional: She is oriented to person, place, and time. She appears well-developed and well-nourished.  HENT:  Head: Normocephalic and atraumatic.  Neurological: She is alert and oriented to person, place, and time.  Psychiatric: Her behavior is normal. Judgment normal.  Flat affect          Assessment & Plan:  Depression with anxiety- deteriorated. I did discuss with patient that if she has recurrent SI then she needs to go to the ER. She tells me that she probably would not go to the ER due to concern that this would be used against her in custody court.  I had a long discussion with her that if she was not here (alive) for her children, that would be much worse than potential custody issues for her children.  She verbalizes understanding.  I did discuss the case with Rodney Cruise our counselor who will try to get the patient in urgently for counseling.     Advised patient as follows:  Begin lexapro 10mg  1/2 tablet by mouth once daily. Increase to a full table once daily on week two. Continue effexor.  If you have recurrent thoughts of hurting yourself please contact the suicide hotline then go to the Emergency Room.  National suicide prevention hotline- 646-746-9442.   Please  contact psychiatry to schedule an appointment.  Psychiatric Services:  [facility], [place] [address] [place] Letta Moynahan, [address], [place], [place] [facility] [phone] [place], [place], [place], [address] [place], [place] [facility] to schedule an appointment with one of our counselors:  [phone]  30 minutes spent with pt today, >50%  of this time was spent counseling patient on anxiety/depression.  We did not have time to address her hypoglycemic issues in depth today- will re-evaluate next visit.

## 2016-07-19 ENCOUNTER — Other Ambulatory Visit: Payer: Self-pay | Admitting: Family

## 2016-07-20 ENCOUNTER — Other Ambulatory Visit: Payer: Self-pay | Admitting: Neurosurgery

## 2016-07-20 DIAGNOSIS — M5136 Other intervertebral disc degeneration, lumbar region: Secondary | ICD-10-CM

## 2016-07-20 MED ORDER — METHOCARBAMOL 500 MG PO TABS: 500.0000 mg | ORAL_TABLET | Freq: Three times a day (TID) | ORAL | 0 refills | Status: DC | PRN

## 2016-07-20 NOTE — Telephone Encounter (Signed)
Please advise. TL/CMA 

## 2016-07-27 ENCOUNTER — Telehealth: Payer: Self-pay | Admitting: Family

## 2016-07-27 ENCOUNTER — Other Ambulatory Visit: Payer: Self-pay | Admitting: Family

## 2016-07-27 NOTE — Telephone Encounter (Signed)
Spoke to patient. She reports feeling much better since starting the lexapro. Mood is improved. Denies SI/HI.  Reports she still is waking up anxious at bedtime. Advised pt to continue lexapro and follow up as scheduled. If still having anxiety at night we can consider increasing her lexapro dose at her next visit. Pt verbalizes understanding.

## 2016-08-04 ENCOUNTER — Ambulatory Visit
Admission: RE | Admit: 2016-08-04 | Discharge: 2016-08-04 | Disposition: A | Discharge status: Home / Self Care | Payer: BLUE CROSS/BLUE SHIELD | Source: Ambulatory Visit | Attending: Neurosurgery | Admitting: Neurosurgery

## 2016-08-04 ENCOUNTER — Encounter: Payer: Self-pay | Admitting: Family

## 2016-08-04 ENCOUNTER — Ambulatory Visit (INDEPENDENT_AMBULATORY_CARE_PROVIDER_SITE_OTHER): Payer: BLUE CROSS/BLUE SHIELD | Admitting: Family

## 2016-08-04 VITALS — BP 120/76 | HR 73 | Temp 99.4°F | Resp 16 | Ht 64.0 in | Wt 215.2 lb

## 2016-08-04 DIAGNOSIS — F32A Depression, unspecified: Secondary | ICD-10-CM

## 2016-08-04 DIAGNOSIS — E162 Hypoglycemia, unspecified: Secondary | ICD-10-CM

## 2016-08-04 DIAGNOSIS — M5136 Other intervertebral disc degeneration, lumbar region: Secondary | ICD-10-CM

## 2016-08-04 DIAGNOSIS — F329 Major depressive disorder, single episode, unspecified: Secondary | ICD-10-CM

## 2016-08-04 DIAGNOSIS — M5137 Other intervertebral disc degeneration, lumbosacral region: Secondary | ICD-10-CM | POA: Diagnosis not present

## 2016-08-04 DIAGNOSIS — F418 Other specified anxiety disorders: Secondary | ICD-10-CM | POA: Insufficient documentation

## 2016-08-04 LAB — GLUCOSE, RANDOM: Glucose, Bld: 92 mg/dL (ref 70–99)

## 2016-08-04 MED ORDER — METHYLPREDNISOLONE ACETATE 40 MG/ML INJ SUSP (RADIOLOG
120.0000 mg | Freq: Once | INTRAMUSCULAR | Status: AC
  Administered 2016-08-04: 120 mg via EPIDURAL

## 2016-08-04 MED ORDER — IOPAMIDOL (ISOVUE-M 200) INJECTION 41%
1.0000 mL | Freq: Once | INTRAMUSCULAR | Status: AC
  Administered 2016-08-04: 1 mL via EPIDURAL

## 2016-08-04 MED ORDER — VALACYCLOVIR HCL 500 MG PO TABS: 500.0000 mg | ORAL_TABLET | Freq: Every day | ORAL | 5 refills | Status: DC

## 2016-08-04 MED ORDER — ESCITALOPRAM OXALATE 10 MG PO TABS: ORAL_TABLET | ORAL | 3 refills | Status: DC

## 2016-08-04 NOTE — Assessment & Plan Note (Signed)
Stable/improved. Continue lexapro and effexor. She has not had time to schedule with a counselor, but I did suggest that she do this. Advised trial of melatonin for sleep.  My hope is that after she moves and settles into her new place she will sleep better.  15 min spent with pt today. >50% of this time was spent counseling pt on depression and diet.

## 2016-08-04 NOTE — Progress Notes (Signed)
Pre visit review using our clinic review tool, if applicable. No additional management support is needed unless otherwise documented below in the visit note. 

## 2016-08-04 NOTE — Patient Instructions (Addendum)
Complete lab work prior to leaving.  Try adding Melatonin 5mg  at bedtime to help with sleep.  Eat small frequent meals with protein.  Avoid white carbs and foods containing added sugar or high fructose corn syrup.  Continue current dose of lexapro.

## 2016-08-04 NOTE — Progress Notes (Signed)
Subjective:    Patient ID: Gina James, female    DOB: 10-06-1980, 35 y.o.   MRN: [id number]  HPI  Gina James is a 35 yr old female who presents today for follow up of her depression/anxiety.  Last visit she described increased anxiety, irritability, insomnia.  Reports that her irritability is better.  Mood is better. Tolerating lexapro, continues effexor. She reports that she works a lot during the day which helps her anxiety.  Denies SI.  Reports that she will be moving out into her own place next week which she is really happy about. Notes that her kids are excited about the move and the holidays and they are doing better.    Wt Readings from Last 3 Encounters:  08/04/16 215 lb 3.2 oz (97.6 kg)  07/14/16 214 lb 9.6 oz (97.3 kg)  04/07/16 216 lb (98 kg)    She reports that she has weakness and dizziness after eating carbs. Reports that if she eats carbs again her symptoms resolve.     Review of Systems See HPI  Past Medical History:  Diagnosis Date  . Ankle fracture, right   . Anxiety   . Arthritis    "in my back" (04/08/2015)  . Chronic lower back pain   . Complication of anesthesia    difficulty urinating after anesthesia  . Headache    "monthly" (04/08/2015)  . Post partum depression      Social History   Social History  . Marital status: Married    Spouse name: N/A  . Number of children: N/A  . Years of education: N/A   Occupational History  . Not on file.   Social History Main Topics  . Smoking status: Former Smoker    Packs/day: 0.12    Years: 10.00    Types: Cigarettes    Quit date: 09/06/2004  . Smokeless tobacco: Never Used  . Alcohol use 2.4 oz/week    4 Cans of beer per week     Comment: 04/08/2015 "drink only the weekends"  . Drug use: No  . Sexual activity: Yes     Comment: vasectomy in partner   Other Topics Concern  . 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           Past Surgical History:  Procedure Laterality Date  . CESAREAN SECTION  2007 and 2009  . COMBINED ABDOMINOPLASTY AND LIPOSUCTION  12/2014   "liposuction in thighs"  . FRACTURE SURGERY    . LAPAROSCOPIC ABDOMINAL EXPLORATION  1997   "to check for endometriosis"  . LAPAROSCOPIC CHOLECYSTECTOMY  2007  . OPEN REDUCTION INTERNAL FIXATION (ORIF) TIBIA/FIBULA FRACTURE Right 04/08/2015   Procedure: OPEN REDUCTION INTERNAL FIXATION (ORIF)  RIGHT TIBIA PILON FRACTURE;  Surgeon: Wylene Simmer, MD;  Location: [facility];  Service: Orthopedics;  Laterality: Right;  . TIBIA FRACTURE SURGERY Right 04/08/2015   ORIF tibial pilon    Family History  Problem Relation Age of Onset  . Diabetes Father   . Cancer Sister 30    breast, had chemo/double mastectomy  . Heart disease Maternal Grandfather   . Heart disease Paternal Grandfather     Allergies  Allergen Reactions  . Amoxicillin Hives  . Ceclor [Cefaclor] Hives  . Penicillins Hives  .  Phenergan [Promethazine Hcl] Hives  . Sulfa Antibiotics Hives  . Zithromax [Azithromycin] Hives    Current Outpatient Prescriptions on File Prior to Visit  Medication Sig Dispense Refill  . L-FORMULA LYSINE HCL PO Take 1 tablet by mouth daily.    Marland Kitchen levonorgestrel-ethinyl estradiol (SEASONALE,INTROVALE,JOLESSA) 0.15-0.03 MG tablet Take 1 tablet by mouth daily. 1 Package 4  . methocarbamol (ROBAXIN) 500 MG tablet Take 1 tablet (500 mg total) by mouth every 8 (eight) hours as needed for muscle spasms (spasms). 30 tablet 0  . Multiple Vitamins-Minerals (MULTIVITAMIN WITH MINERALS) tablet Take 1 tablet by mouth daily.    Marland Kitchen OVER THE COUNTER MEDICATION TUMERIC.  Take 2 capsules daily.    Marland Kitchen pyridOXINE (VITAMIN B-6) 100 MG tablet Take 1,000 mg by mouth daily.    Marland Kitchen venlafaxine XR (EFFEXOR-XR) 150 MG 24 hr capsule TAKE ONE CAPSULE BY MOUTH ONCE DAILY 30 capsule 5    No current facility-administered medications on file prior to visit.     BP 120/76 (BP Location: Right Arm, Cuff Size: Large)   Pulse 73   Temp 99.4 F (37.4 C) (Oral)   Resp 16   Ht 5\' 4"  (1.626 m)   Wt 215 lb 3.2 oz (97.6 kg)   LMP 06/04/2016   SpO2 100% Comment: room air  BMI 36.94 kg/m       Objective:   Physical Exam  Constitutional: She is oriented to person, place, and time. She appears well-developed and well-nourished.  Cardiovascular: Regular rhythm.   Neurological: She is alert and oriented to person, place, and time.  Psychiatric: She has a normal mood and affect. Her behavior is normal. Judgment and thought content normal.          Assessment & Plan:  Hypoglycemia-  Will obtain glucose, insulin/cpeptid, proinsulin levels to further evaluate. We discussed eating 6 small meals a day with protein and avoiding white carbs/added sugar.

## 2016-08-04 NOTE — Discharge Instructions (Signed)

## 2016-08-10 DIAGNOSIS — F419 Anxiety disorder, unspecified: Secondary | ICD-10-CM | POA: Diagnosis not present

## 2016-08-13 LAB — PROINSULIN: Proinsulin: 15.9 pmol/L (ref <=18.8)

## 2016-08-16 ENCOUNTER — Encounter: Payer: Self-pay | Admitting: Family

## 2016-08-16 ENCOUNTER — Ambulatory Visit (INDEPENDENT_AMBULATORY_CARE_PROVIDER_SITE_OTHER): Payer: BLUE CROSS/BLUE SHIELD | Admitting: Family

## 2016-08-16 DIAGNOSIS — F418 Other specified anxiety disorders: Secondary | ICD-10-CM

## 2016-08-16 MED ORDER — ESCITALOPRAM OXALATE 20 MG PO TABS: 20.0000 mg | ORAL_TABLET | Freq: Every day | ORAL | 1 refills | Status: DC

## 2016-08-16 NOTE — Progress Notes (Signed)
Subjective:    Patient ID: Gina James, female    DOB: Jul 19, 1981, 35 y.o.   MRN: [id number]  HPI  Gina James is a 35 yr old female who presents today for follow up of her depression. She reported improved mood last visit on lexapro.  She reports that since last visit she has moved into her own apartment (no longer living with her husband). Her children are splitting time between them.  Notes that she has been working a lot.  Has been having panic attacks about 4 times a day.  Feels more down. Denies active suicide ideation, however she reports that 2 days ago she had the thought that "I wished I was dead." She did not schedule an appointment with a therapist.    Review of Systems    see HPI  Past Medical History:  Diagnosis Date  . Ankle fracture, right   . Anxiety   . Arthritis    "in my back" (04/08/2015)  . Chronic lower back pain   . Complication of anesthesia    difficulty urinating after anesthesia  . Headache    "monthly" (04/08/2015)  . Post partum depression      Social History   Social History  . Marital status: Married    Spouse name: N/A  . Number of children: N/A  . Years of education: N/A   Occupational History  . Not on file.   Social History Main Topics  . Smoking status: Former Smoker    Packs/day: 0.12    Years: 10.00    Types: Cigarettes    Quit date: 09/06/2004  . Smokeless tobacco: Never Used  . Alcohol use 2.4 oz/week    4 Cans of beer per week     Comment: 04/08/2015 "drink only the weekends"  . Drug use: No  . Sexual activity: Yes     Comment: vasectomy in partner   Other Topics Concern  . 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           Past Surgical History:  Procedure Laterality Date  . CESAREAN SECTION  2007 and 2009  . COMBINED ABDOMINOPLASTY  AND LIPOSUCTION  12/2014   "liposuction in thighs"  . FRACTURE SURGERY    . LAPAROSCOPIC ABDOMINAL EXPLORATION  1997   "to check for endometriosis"  . LAPAROSCOPIC CHOLECYSTECTOMY  2007  . OPEN REDUCTION INTERNAL FIXATION (ORIF) TIBIA/FIBULA FRACTURE Right 04/08/2015   Procedure: OPEN REDUCTION INTERNAL FIXATION (ORIF)  RIGHT TIBIA PILON FRACTURE;  Surgeon: Wylene Simmer, MD;  Location: [facility];  Service: Orthopedics;  Laterality: Right;  . TIBIA FRACTURE SURGERY Right 04/08/2015   ORIF tibial pilon    Family History  Problem Relation Age of Onset  . Diabetes Father   . Cancer Sister 30    breast, had chemo/double mastectomy  . Heart disease Maternal Grandfather   . Heart disease Paternal Grandfather     Allergies  Allergen Reactions  . Amoxicillin Hives  . Ceclor [Cefaclor] Hives  . Penicillins Hives  . Phenergan [Promethazine Hcl] Hives  . Sulfa Antibiotics Hives  . Zithromax [Azithromycin] Hives    Current Outpatient Prescriptions on File Prior to Visit  Medication Sig Dispense Refill  . L-FORMULA LYSINE HCL PO Take 1 tablet by mouth daily.    Marland Kitchen  levonorgestrel-ethinyl estradiol (SEASONALE,INTROVALE,JOLESSA) 0.15-0.03 MG tablet Take 1 tablet by mouth daily. 1 Package 4  . methocarbamol (ROBAXIN) 500 MG tablet Take 1 tablet (500 mg total) by mouth every 8 (eight) hours as needed for muscle spasms (spasms). 30 tablet 0  . Multiple Vitamins-Minerals (MULTIVITAMIN WITH MINERALS) tablet Take 1 tablet by mouth daily.    Marland Kitchen OVER THE COUNTER MEDICATION TUMERIC.  Take 2 capsules daily.    Marland Kitchen pyridOXINE (VITAMIN B-6) 100 MG tablet Take 1,000 mg by mouth daily.    . valACYclovir (VALTREX) 500 MG tablet Take 1 tablet (500 mg total) by mouth daily. 30 tablet 5  . venlafaxine XR (EFFEXOR-XR) 150 MG 24 hr capsule TAKE ONE CAPSULE BY MOUTH ONCE DAILY 30 capsule 5   No current facility-administered medications on file prior to visit.     BP 124/81 (BP Location: Right Arm, Cuff Size: Large)    Pulse 75   Temp 99.8 F (37.7 C) (Oral)   Resp 16   Ht 5\' 4"  (1.626 m)   Wt 211 lb (95.7 kg)   LMP 06/04/2016 Comment: only has 3 periods a yr  SpO2 100% Comment: room air  BMI 36.22 kg/m    Objective:   Physical Exam  Constitutional: She appears well-developed and well-nourished. No distress.  Psychiatric: Judgment and thought content normal.  Flat affect, mildly tearful.           Assessment & Plan:

## 2016-08-16 NOTE — Patient Instructions (Addendum)
Please increase lexapro from 10mg  to 20mg  once daily. Continue effexor. Call 911 or go to the ER if you develop thoughts of hurting yourself or others. Please call [facility] to schedule an appointment with a counselor. (they have offices here in [place] or in [place]). Also, please schedule an appointment with psychiatry to help manage your medications.  Psychiatric Services:  [facility], [place] [address] [place] Letta Moynahan, [address], [place], [place] [facility] [phone] [place], [place], [facility], [address] [place], [place]

## 2016-08-16 NOTE — Assessment & Plan Note (Addendum)
Scored 17 on PHQ9. Deteriorated. We discussed continuing effexor, increasing lexapro from 10mg  to 20mg . Also discussed importance of establishing with a  Counselor and a psychiatrist and she has been given contact numbers to schedule these appointments.  She understands that if she has thoughts of hurting herself with plan to go directly to the ER and she verbalizes

## 2016-08-16 NOTE — Progress Notes (Signed)
Pre visit review using our clinic review tool, if applicable. No additional management support is needed unless otherwise documented below in the visit note. 

## 2016-08-25 ENCOUNTER — Other Ambulatory Visit: Payer: Self-pay | Admitting: Family

## 2016-08-26 MED ORDER — METHOCARBAMOL 500 MG PO TABS: 500.0000 mg | ORAL_TABLET | Freq: Three times a day (TID) | ORAL | 0 refills | Status: DC | PRN

## 2016-09-22 ENCOUNTER — Ambulatory Visit: Payer: BLUE CROSS/BLUE SHIELD | Admitting: Family

## 2016-10-25 ENCOUNTER — Other Ambulatory Visit: Payer: Self-pay | Admitting: Family

## 2016-10-30 ENCOUNTER — Other Ambulatory Visit: Payer: Self-pay | Admitting: Family

## 2016-11-01 NOTE — Telephone Encounter (Signed)
Last OV: 08/16/16 Next OV:  11/03/16 Last Rx: 08/26/17, #30.  Please advise request?

## 2016-11-03 ENCOUNTER — Ambulatory Visit (INDEPENDENT_AMBULATORY_CARE_PROVIDER_SITE_OTHER): Payer: BLUE CROSS/BLUE SHIELD | Admitting: Family

## 2016-11-03 ENCOUNTER — Encounter: Payer: Self-pay | Admitting: Family

## 2016-11-03 VITALS — BP 114/71 | HR 76 | Temp 99.3°F | Resp 16 | Ht 64.0 in | Wt 207.4 lb

## 2016-11-03 DIAGNOSIS — F418 Other specified anxiety disorders: Secondary | ICD-10-CM

## 2016-11-03 DIAGNOSIS — M62830 Muscle spasm of back: Secondary | ICD-10-CM

## 2016-11-03 MED ORDER — METHOCARBAMOL 500 MG PO TABS: 500.0000 mg | ORAL_TABLET | Freq: Two times a day (BID) | ORAL | 2 refills | Status: DC | PRN

## 2016-11-03 MED ORDER — ESCITALOPRAM OXALATE 20 MG PO TABS: 20.0000 mg | ORAL_TABLET | Freq: Every day | ORAL | 1 refills | Status: DC

## 2016-11-03 NOTE — Progress Notes (Signed)
Subjective:    Patient ID: Gina James, female    DOB: 02-Mar-1981, 36 y.o.   MRN: [id number]  HPI  Gina James is a 36 yr old female who presents today for follow up of her depression/anxiety. Last visit she noted frequent panic attacks and ongoing depression. She was continued on effexor and her lexapro dose was increased from 10mg  to 20mg .  Reports that she is Gina James.  She is working with a Physiological scientist.  Reports that she is feeling "amazing." Feels that she is really healing and taking care of herself.  Mood is good, anxiety is improved.   Wt Readings from Last 3 Encounters:  11/03/16 207 lb 6.4 oz (94.1 kg)  08/16/16 211 lb (95.7 kg)  08/04/16 215 lb 3.2 oz (97.6 kg)   Reports that some days she wakes up with low back spasms.   Review of Systems See HPI  Past Medical History:  Diagnosis Date  . Ankle fracture, right   . Anxiety   . Arthritis    "in my back" (04/08/2015)  . Chronic lower back pain   . Complication of anesthesia    difficulty urinating after anesthesia  . Headache    "monthly" (04/08/2015)  . Post partum depression      Social History   Social History  . Marital status: Married    Spouse name: N/A  . Number of children: N/A  . Years of education: N/A   Occupational History  . Not on file.   Social History Main Topics  . Smoking status: Former Smoker    Packs/day: 0.12    Years: 10.00    Types: Cigarettes    Quit date: 09/06/2004  . Smokeless tobacco: Never Used  . Alcohol use 2.4 oz/week    4 Cans of beer per week     Comment: 04/08/2015 "drink only the weekends"  . Drug use: No  . Sexual activity: Yes     Comment: vasectomy in partner   Other Topics Concern  . 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           Past Surgical History:   Procedure Laterality Date  . CESAREAN SECTION  2007 and 2009  . COMBINED ABDOMINOPLASTY AND LIPOSUCTION  12/2014   "liposuction in thighs"  . FRACTURE SURGERY    . LAPAROSCOPIC ABDOMINAL EXPLORATION  1997   "to check for endometriosis"  . LAPAROSCOPIC CHOLECYSTECTOMY  2007  . OPEN REDUCTION INTERNAL FIXATION (ORIF) TIBIA/FIBULA FRACTURE Right 04/08/2015   Procedure: OPEN REDUCTION INTERNAL FIXATION (ORIF)  RIGHT TIBIA PILON FRACTURE;  Surgeon: Wylene Simmer, MD;  Location: [facility];  Service: Orthopedics;  Laterality: Right;  . TIBIA FRACTURE SURGERY Right 04/08/2015   ORIF tibial pilon    Family History  Problem Relation Age of Onset  . Diabetes Father   . Cancer Sister 30    breast, had chemo/double mastectomy  . Heart disease Maternal Grandfather   . Heart disease Paternal Grandfather     Allergies  Allergen Reactions  . Amoxicillin Hives  . Ceclor [Cefaclor] Hives  . Penicillins Hives  . Phenergan [Promethazine Hcl] Hives  . Sulfa Antibiotics Hives  . Zithromax [Azithromycin] Hives    Current Outpatient Prescriptions on File Prior to Visit  Medication Sig Dispense Refill  . escitalopram (LEXAPRO) 20 MG tablet TAKE ONE TABLET BY MOUTH ONCE DAILY 30 tablet 0  . L-FORMULA LYSINE HCL PO Take 1 tablet by mouth daily.    Marland Kitchen levonorgestrel-ethinyl estradiol (SEASONALE,INTROVALE,JOLESSA) 0.15-0.03 MG tablet Take 1 tablet by mouth daily. 1 Package 4  . methocarbamol (ROBAXIN) 500 MG tablet Take 1 tablet (500 mg total) by mouth every 8 (eight) hours as needed for muscle spasms (spasms). 30 tablet 0  . Multiple Vitamins-Minerals (MULTIVITAMIN WITH MINERALS) tablet Take 1 tablet by mouth daily.    Marland Kitchen OVER THE COUNTER MEDICATION TUMERIC.  Take 2 capsules daily.    Marland Kitchen pyridOXINE (VITAMIN B-6) 100 MG tablet Take 1,000 mg by mouth daily.    . valACYclovir (VALTREX) 500 MG tablet Take 1 tablet (500 mg total) by mouth daily. 30 tablet 5  . venlafaxine XR (EFFEXOR-XR) 150 MG 24 hr capsule TAKE ONE  CAPSULE BY MOUTH ONCE DAILY 30 capsule 5   No current facility-administered medications on file prior to visit.     BP 114/71 (BP Location: Right Arm, Patient Position: Sitting, Cuff Size: Large)   Pulse 76   Temp 99.3 F (37.4 C) (Oral)   Resp 16   Ht 5\' 4"  (1.626 m)   Wt 207 lb 6.4 oz (94.1 kg)   SpO2 100%   BMI 35.60 kg/m       Objective:   Physical Exam  Constitutional: She is oriented to person, place, and time. She appears well-developed and well-nourished. No distress.  Musculoskeletal: She exhibits no edema.  Neurological: She is alert and oriented to person, place, and time.  Psychiatric: She has a normal mood and affect. Her behavior is normal. Judgment and thought content normal.          Assessment & Plan:

## 2016-11-03 NOTE — Telephone Encounter (Signed)
Methocarbamol Rx completed at visit on 11/03/16.

## 2016-11-03 NOTE — Assessment & Plan Note (Signed)
Much improved. Continue effexor. Lexapro, work with therapist and regular exercise. Plan follow up in 3 months.

## 2016-11-03 NOTE — Assessment & Plan Note (Signed)
Intermittent. OK to continue prn robaxin, advised pt to use sparingly.

## 2016-11-03 NOTE — Progress Notes (Signed)
Pre visit review using our clinic review tool, if applicable. No additional management support is needed unless otherwise documented below in the visit note. 

## 2016-11-03 NOTE — Patient Instructions (Addendum)
Continue current medications.  Keep up the great work!

## 2016-11-10 DIAGNOSIS — F329 Major depressive disorder, single episode, unspecified: Secondary | ICD-10-CM | POA: Diagnosis not present

## 2016-12-02 DIAGNOSIS — F329 Major depressive disorder, single episode, unspecified: Secondary | ICD-10-CM | POA: Diagnosis not present

## 2016-12-09 DIAGNOSIS — F329 Major depressive disorder, single episode, unspecified: Secondary | ICD-10-CM | POA: Diagnosis not present

## 2016-12-09 DIAGNOSIS — M545 Low back pain: Secondary | ICD-10-CM | POA: Diagnosis not present

## 2016-12-09 DIAGNOSIS — Z6834 Body mass index (BMI) 34.0-34.9, adult: Secondary | ICD-10-CM | POA: Diagnosis not present

## 2016-12-09 DIAGNOSIS — R634 Abnormal weight loss: Secondary | ICD-10-CM | POA: Diagnosis not present

## 2016-12-09 DIAGNOSIS — G8929 Other chronic pain: Secondary | ICD-10-CM | POA: Diagnosis not present

## 2016-12-28 ENCOUNTER — Telehealth: Payer: Self-pay | Admitting: *Deleted

## 2016-12-28 NOTE — Telephone Encounter (Signed)
Received fax from [facility] requesting rx for Ibuprofen 800mg . Take 1 tablet by mouth three times daily as needed with food. Attempted to reach pt to determine why medication is needed and unable to leave message as voice mailbox was full. Sent Gina James.

## 2016-12-30 DIAGNOSIS — M5440 Lumbago with sciatica, unspecified side: Secondary | ICD-10-CM | POA: Diagnosis not present

## 2016-12-30 DIAGNOSIS — G8929 Other chronic pain: Secondary | ICD-10-CM | POA: Diagnosis not present

## 2017-01-10 ENCOUNTER — Other Ambulatory Visit (HOSPITAL_COMMUNITY)
Admission: RE | Admit: 2017-01-10 | Discharge: 2017-01-10 | Disposition: A | Discharge status: Home / Self Care | Payer: BLUE CROSS/BLUE SHIELD | Source: Ambulatory Visit | Attending: Family | Admitting: Family

## 2017-01-10 ENCOUNTER — Encounter: Payer: Self-pay | Admitting: Family

## 2017-01-10 ENCOUNTER — Ambulatory Visit (INDEPENDENT_AMBULATORY_CARE_PROVIDER_SITE_OTHER): Payer: BLUE CROSS/BLUE SHIELD | Admitting: Family

## 2017-01-10 VITALS — BP 136/89 | HR 94 | Temp 100.0°F | Resp 18 | Ht 64.0 in | Wt 192.8 lb

## 2017-01-10 DIAGNOSIS — N760 Acute vaginitis: Secondary | ICD-10-CM | POA: Diagnosis not present

## 2017-01-10 DIAGNOSIS — B079 Viral wart, unspecified: Secondary | ICD-10-CM | POA: Diagnosis not present

## 2017-01-10 DIAGNOSIS — A6004 Herpesviral vulvovaginitis: Secondary | ICD-10-CM

## 2017-01-10 NOTE — Progress Notes (Signed)
Subjective:    Patient ID: Gina James, female    DOB: Nov 27, 1980, 36 y.o.   MRN: [id number]  HPI   [name] is a 36 yr old female who presents today with concern of vulvar pain.  Notes that pain began yesterday.  About 1 month ago she noted some discoloration of clitoral hood on the right. She does report some vaginal discharge. Denies new partners. 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  Notes a " wart" on her right inner thigh.  Review of Systems    see HPI  Past Medical History:  Diagnosis Date  . Ankle fracture, right   . Anxiety   . Arthritis    "in my back" (04/08/2015)  . Chronic lower back pain   . Complication of anesthesia    difficulty urinating after anesthesia  . Headache    "monthly" (04/08/2015)  . Post partum depression      Social History   Social History  . Marital status: Married    Spouse name: N/A  . Number of children: N/A  . Years of education: N/A   Occupational History  . Not on file.   Social History Main Topics  . Smoking status: Former Smoker    Packs/day: 0.12    Years: 10.00    Types: Cigarettes    Quit date: 09/06/2004  . Smokeless tobacco: Never Used  . Alcohol use 2.4 oz/week    4 Cans of beer per week     Comment: 04/08/2015 "drink only the weekends"  . Drug use: No  . Sexual activity: Yes     Comment: vasectomy in partner   Other Topics Concern  . 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           Past Surgical History:  Procedure Laterality Date  . CESAREAN SECTION  2007 and 2009  . COMBINED ABDOMINOPLASTY AND LIPOSUCTION  12/2014   "liposuction in thighs"  . FRACTURE SURGERY    . LAPAROSCOPIC ABDOMINAL EXPLORATION  1997   "to check for endometriosis"  . LAPAROSCOPIC CHOLECYSTECTOMY  2007  . OPEN REDUCTION  INTERNAL FIXATION (ORIF) TIBIA/FIBULA FRACTURE Right 04/08/2015   Procedure: OPEN REDUCTION INTERNAL FIXATION (ORIF)  RIGHT TIBIA PILON FRACTURE;  Surgeon: Wylene Simmer, MD;  Location: [facility];  Service: Orthopedics;  Laterality: Right;  . TIBIA FRACTURE SURGERY Right 04/08/2015   ORIF tibial pilon    Family History  Problem Relation Age of Onset  . Diabetes Father   . Cancer Sister 30    breast, had chemo/double mastectomy  . Heart disease Maternal Grandfather   . Heart disease Paternal Grandfather     Allergies  Allergen Reactions  . Amoxicillin Hives  . Ceclor [Cefaclor] Hives  . Erythromycin Base Rash  . Penicillins Hives  . Phenergan [Promethazine Hcl] Hives  . Sulfa Antibiotics Hives  . Zithromax [Azithromycin] Hives    Current Outpatient Prescriptions on File Prior to Visit  Medication Sig Dispense Refill  . escitalopram (LEXAPRO) 20 MG tablet Take 1 tablet (20 mg total) by mouth daily. 90 tablet 1  . L-FORMULA LYSINE HCL PO Take 1 tablet by mouth daily.    Marland Kitchen levonorgestrel-ethinyl estradiol (SEASONALE,INTROVALE,JOLESSA) 0.15-0.03 MG tablet Take 1 tablet by mouth daily.  1 Package 4  . methocarbamol (ROBAXIN) 500 MG tablet Take 1 tablet (500 mg total) by mouth 2 (two) times daily as needed for muscle spasms. 30 tablet 2  . Multiple Vitamins-Minerals (MULTIVITAMIN WITH MINERALS) tablet Take 1 tablet by mouth daily.    Marland Kitchen OVER THE COUNTER MEDICATION TUMERIC.  Take 2 capsules daily.    Marland Kitchen pyridOXINE (VITAMIN B-6) 100 MG tablet Take 1,000 mg by mouth daily.    . valACYclovir (VALTREX) 500 MG tablet Take 1 tablet (500 mg total) by mouth daily. 30 tablet 5  . venlafaxine XR (EFFEXOR-XR) 150 MG 24 hr capsule TAKE ONE CAPSULE BY MOUTH ONCE DAILY 30 capsule 5   No current facility-administered medications on file prior to visit.     BP 136/89 (BP Location: Right Arm, Cuff Size: Normal)   Pulse 94   Temp 100 F (37.8 C) (Oral)   Resp 18   Ht 5\' 4"  (1.626 m)   Wt 192 lb 12.8 oz (87.5  kg)   SpO2 100%   BMI 33.09 kg/m    Objective:   Physical Exam  Constitutional: She is oriented to person, place, and time. She appears well-developed and well-nourished.  Genitourinary:     Genitourinary Comments: Slightly irregular pigmentation (benign appearing) of the right clitoral hood. Small area of tender ulceration noted on left labia minora  Musculoskeletal: She exhibits no edema.  Neurological: She is alert and oriented to person, place, and time.  Skin: Skin is warm and dry.  Psychiatric: She has a normal mood and affect. Her behavior is normal. Judgment and thought content normal.          Assessment & Plan:  HSV2 outbreak- advised pt to increase valtrex to 500mg  bid x 3 days, then return to 500mg  once daily.   Wart- patient with wart like lesion on right upper/inner thigh. Wart was frozen using liquid nitrogen after verbal consent. Pt tolerated procedure well.   Vaginal discharge- obtained wet prep/GC chlamydia probes for further evaluation.

## 2017-01-10 NOTE — Progress Notes (Signed)
Pre visit review using our clinic review tool, if applicable. No additional management support is needed unless otherwise documented below in the visit note. 

## 2017-01-10 NOTE — Patient Instructions (Signed)
Please increase valtrex to 500mg  twice daily for 3 days then return to one tablet once daily. You may use tylenol as needed for pain.  If wart is not resolved in 1 month, please return for retreatment.

## 2017-01-12 LAB — CERVICOVAGINAL ANCILLARY ONLY
CHLAMYDIA, DNA PROBE: NEGATIVE
NEISSERIA GONORRHEA: NEGATIVE
Wet Prep (BD Affirm): NEGATIVE

## 2017-01-13 ENCOUNTER — Telehealth: Payer: Self-pay | Admitting: Family

## 2017-01-13 DIAGNOSIS — M439 Deforming dorsopathy, unspecified: Secondary | ICD-10-CM | POA: Diagnosis not present

## 2017-01-13 DIAGNOSIS — M545 Low back pain: Secondary | ICD-10-CM | POA: Diagnosis not present

## 2017-01-13 DIAGNOSIS — M5442 Lumbago with sciatica, left side: Secondary | ICD-10-CM | POA: Diagnosis not present

## 2017-01-13 DIAGNOSIS — M4697 Unspecified inflammatory spondylopathy, lumbosacral region: Secondary | ICD-10-CM | POA: Diagnosis not present

## 2017-01-13 DIAGNOSIS — M5441 Lumbago with sciatica, right side: Secondary | ICD-10-CM | POA: Diagnosis not present

## 2017-01-13 DIAGNOSIS — M5136 Other intervertebral disc degeneration, lumbar region: Secondary | ICD-10-CM | POA: Insufficient documentation

## 2017-01-13 NOTE — Telephone Encounter (Signed)
Please contact pt re: unread mychart message. 

## 2017-01-14 NOTE — Telephone Encounter (Signed)
No thanks  

## 2017-01-14 NOTE — Telephone Encounter (Signed)
Melissa-- I do not see a mychart message but I did see that the pt has read her result comments. Is there something else?

## 2017-01-17 ENCOUNTER — Other Ambulatory Visit: Payer: Self-pay | Admitting: Family

## 2017-01-22 DIAGNOSIS — M4727 Other spondylosis with radiculopathy, lumbosacral region: Secondary | ICD-10-CM | POA: Diagnosis not present

## 2017-01-22 DIAGNOSIS — M5117 Intervertebral disc disorders with radiculopathy, lumbosacral region: Secondary | ICD-10-CM | POA: Diagnosis not present

## 2017-01-22 DIAGNOSIS — M5116 Intervertebral disc disorders with radiculopathy, lumbar region: Secondary | ICD-10-CM | POA: Diagnosis not present

## 2017-01-22 DIAGNOSIS — M4726 Other spondylosis with radiculopathy, lumbar region: Secondary | ICD-10-CM | POA: Diagnosis not present

## 2017-01-27 DIAGNOSIS — F329 Major depressive disorder, single episode, unspecified: Secondary | ICD-10-CM | POA: Diagnosis not present

## 2017-02-01 ENCOUNTER — Encounter: Payer: Self-pay | Admitting: Family

## 2017-02-02 ENCOUNTER — Encounter: Payer: Self-pay | Admitting: Family

## 2017-02-02 ENCOUNTER — Ambulatory Visit (INDEPENDENT_AMBULATORY_CARE_PROVIDER_SITE_OTHER): Payer: BLUE CROSS/BLUE SHIELD | Admitting: Family

## 2017-02-02 VITALS — BP 100/60 | HR 76 | Temp 99.8°F | Resp 18 | Ht 64.0 in | Wt 194.2 lb

## 2017-02-02 DIAGNOSIS — F329 Major depressive disorder, single episode, unspecified: Secondary | ICD-10-CM | POA: Diagnosis not present

## 2017-02-02 DIAGNOSIS — M545 Low back pain, unspecified: Secondary | ICD-10-CM

## 2017-02-02 DIAGNOSIS — F32A Depression, unspecified: Secondary | ICD-10-CM

## 2017-02-02 NOTE — Patient Instructions (Addendum)
Please schedule a physical at your earliest convenience. Please continue your work with your therapist and contact psychiatry to schedule an appointment.   Psychiatric Services:  [facility], [place] [address] [place], [place] [facility] [place], [place], [facility], [address] [place], [place]

## 2017-02-02 NOTE — Progress Notes (Signed)
Subjective:    Patient ID: Gina James, female    DOB: 02/14/1981, 36 y.o.   MRN: [id number]  HPI  Gina James is a 36 yr old female who presents today for follow up.  1) Depression/anxiety- she is working with a Transport planner.  She is finding therapy to be very helpful.  In addition she continues effexor xr 150mg  and lexapro 20mg . Notes that she continues to work through adjusting from "ending a marriage." Reports she is getting used to doing things that I have never done before, "like supporting a family."  Knows that she cannot "go back to him" but mourns the fact that he was not what she wanted him to be.    Had recent MRI of the lumbar spine which was done at [facility] and she has questions about "lesions" on the MRI. She is planning upcoming surgery for her back pain.   Review of Systems See HPI  Past Medical History:  Diagnosis Date  . Ankle fracture, right   . Anxiety   . Arthritis    "in my back" (04/08/2015)  . Chronic lower back pain   . Complication of anesthesia    difficulty urinating after anesthesia  . Headache    "monthly" (04/08/2015)  . Post partum depression      Social History   Social History  . Marital status: Married    Spouse name: N/A  . Number of children: N/A  . Years of education: N/A   Occupational History  . Not on file.   Social History Main Topics  . Smoking status: Former Smoker    Packs/day: 0.12    Years: 10.00    Types: Cigarettes    Quit date: 09/06/2004  . Smokeless tobacco: Never Used  . Alcohol use 2.4 oz/week    4 Cans of beer per week     Comment: 04/08/2015 "drink only the weekends"  . Drug use: No  . Sexual activity: Yes     Comment: vasectomy in partner   Other Topics Concern  . 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           Past  Surgical History:  Procedure Laterality Date  . CESAREAN SECTION  2007 and 2009  . COMBINED ABDOMINOPLASTY AND LIPOSUCTION  12/2014   "liposuction in thighs"  . FRACTURE SURGERY    . LAPAROSCOPIC ABDOMINAL EXPLORATION  1997   "to check for endometriosis"  . LAPAROSCOPIC CHOLECYSTECTOMY  2007  . OPEN REDUCTION INTERNAL FIXATION (ORIF) TIBIA/FIBULA FRACTURE Right 04/08/2015   Procedure: OPEN REDUCTION INTERNAL FIXATION (ORIF)  RIGHT TIBIA PILON FRACTURE;  Surgeon: Wylene Simmer, MD;  Location: [facility];  Service: Orthopedics;  Laterality: Right;  . TIBIA FRACTURE SURGERY Right 04/08/2015   ORIF tibial pilon    Family History  Problem Relation Age of Onset  . Diabetes Father   . Cancer Sister 30       breast, had chemo/double mastectomy  . Heart disease Maternal Grandfather   . Heart disease Paternal Grandfather     Allergies  Allergen Reactions  . Amoxicillin Hives  . Ceclor [Cefaclor] Hives  . Erythromycin Base Rash  . Penicillins Hives  . Phenergan [Promethazine Hcl] Hives  . Sulfa Antibiotics Hives  . Zithromax [Azithromycin] Hives  Current Outpatient Prescriptions on File Prior to Visit  Medication Sig Dispense Refill  . escitalopram (LEXAPRO) 20 MG tablet Take 1 tablet (20 mg total) by mouth daily. 90 tablet 1  . L-FORMULA LYSINE HCL PO Take 1 tablet by mouth daily.    Marland Kitchen levonorgestrel-ethinyl estradiol (SEASONALE,INTROVALE,JOLESSA) 0.15-0.03 MG tablet Take 1 tablet by mouth daily. 1 Package 4  . methocarbamol (ROBAXIN) 500 MG tablet Take 1 tablet (500 mg total) by mouth 2 (two) times daily as needed for muscle spasms. 30 tablet 2  . Multiple Vitamins-Minerals (MULTIVITAMIN WITH MINERALS) tablet Take 1 tablet by mouth daily.    Marland Kitchen OVER THE COUNTER MEDICATION TUMERIC.  Take 2 capsules daily.    Marland Kitchen pyridOXINE (VITAMIN B-6) 100 MG tablet Take 1,000 mg by mouth daily.    . valACYclovir (VALTREX) 500 MG tablet Take 1 tablet (500 mg total) by mouth daily. 30 tablet 5  . venlafaxine XR  (EFFEXOR-XR) 150 MG 24 hr capsule TAKE ONE CAPSULE BY MOUTH ONCE DAILY 30 capsule 5   No current facility-administered medications on file prior to visit.     BP 100/60 (BP Location: Right Arm, Cuff Size: Large)   Pulse 76   Temp 99.8 F (37.7 C) (Oral)   Resp 18   Ht 5\' 4"  (1.626 m)   Wt 194 lb 3.2 oz (88.1 kg)   SpO2 100%   BMI 33.33 kg/m       Objective:   Physical Exam  Constitutional: She is oriented to person, place, and time. She appears well-developed and well-nourished. No distress.  Neurological: She is alert and oriented to person, place, and time.  Psychiatric: Her behavior is normal. Judgment and thought content normal.  Slightly flat affect          Assessment & Plan:  Depression-  Symptoms seem to have worsened some. She is continuing to work with a therapist which she feels is helping her. Scored 13 on PHQ-9.  I have advised her to schedule an appointment with psychiatry.  She is agreeable to do so.   Low back pain- MRI results are reviewed in care everwhere. Advised pt that the "lesions" noted on her spinal MRI were consistent with benign venous malformations and are not worrisome.

## 2017-02-10 DIAGNOSIS — M5137 Other intervertebral disc degeneration, lumbosacral region: Secondary | ICD-10-CM | POA: Diagnosis not present

## 2017-02-11 ENCOUNTER — Other Ambulatory Visit: Payer: Self-pay | Admitting: Family

## 2017-03-02 DIAGNOSIS — F329 Major depressive disorder, single episode, unspecified: Secondary | ICD-10-CM | POA: Diagnosis not present

## 2017-03-09 DIAGNOSIS — M545 Low back pain: Secondary | ICD-10-CM | POA: Diagnosis not present

## 2017-03-09 DIAGNOSIS — E668 Other obesity: Secondary | ICD-10-CM | POA: Diagnosis not present

## 2017-03-09 DIAGNOSIS — M5137 Other intervertebral disc degeneration, lumbosacral region: Secondary | ICD-10-CM | POA: Diagnosis not present

## 2017-03-10 ENCOUNTER — Other Ambulatory Visit: Payer: Self-pay | Admitting: Family

## 2017-03-10 ENCOUNTER — Encounter: Payer: Self-pay | Admitting: Family

## 2017-03-10 MED ORDER — METHOCARBAMOL 750 MG PO TABS: 750.0000 mg | ORAL_TABLET | Freq: Two times a day (BID) | ORAL | 0 refills | Status: DC | PRN

## 2017-03-22 DIAGNOSIS — F329 Major depressive disorder, single episode, unspecified: Secondary | ICD-10-CM | POA: Diagnosis not present

## 2017-03-31 DIAGNOSIS — M5126 Other intervertebral disc displacement, lumbar region: Secondary | ICD-10-CM | POA: Insufficient documentation

## 2017-04-01 DIAGNOSIS — M5416 Radiculopathy, lumbar region: Secondary | ICD-10-CM

## 2017-04-01 DIAGNOSIS — M533 Sacrococcygeal disorders, not elsewhere classified: Secondary | ICD-10-CM | POA: Diagnosis not present

## 2017-04-01 DIAGNOSIS — M5126 Other intervertebral disc displacement, lumbar region: Secondary | ICD-10-CM | POA: Diagnosis not present

## 2017-04-01 DIAGNOSIS — M5136 Other intervertebral disc degeneration, lumbar region: Secondary | ICD-10-CM | POA: Diagnosis not present

## 2017-04-01 HISTORY — DX: Radiculopathy, lumbar region: M54.16

## 2017-04-05 DIAGNOSIS — M545 Low back pain: Secondary | ICD-10-CM | POA: Diagnosis not present

## 2017-04-05 DIAGNOSIS — Z882 Allergy status to sulfonamides status: Secondary | ICD-10-CM | POA: Diagnosis not present

## 2017-04-05 DIAGNOSIS — Z880 Allergy status to penicillin: Secondary | ICD-10-CM | POA: Diagnosis not present

## 2017-04-05 DIAGNOSIS — Z79891 Long term (current) use of opiate analgesic: Secondary | ICD-10-CM | POA: Diagnosis not present

## 2017-04-05 DIAGNOSIS — E669 Obesity, unspecified: Secondary | ICD-10-CM | POA: Diagnosis not present

## 2017-04-05 DIAGNOSIS — Z791 Long term (current) use of non-steroidal anti-inflammatories (NSAID): Secondary | ICD-10-CM | POA: Diagnosis not present

## 2017-04-05 DIAGNOSIS — G8929 Other chronic pain: Secondary | ICD-10-CM | POA: Diagnosis not present

## 2017-04-05 DIAGNOSIS — M549 Dorsalgia, unspecified: Secondary | ICD-10-CM | POA: Diagnosis not present

## 2017-04-05 DIAGNOSIS — M5117 Intervertebral disc disorders with radiculopathy, lumbosacral region: Secondary | ICD-10-CM | POA: Diagnosis not present

## 2017-04-05 DIAGNOSIS — Z793 Long term (current) use of hormonal contraceptives: Secondary | ICD-10-CM | POA: Diagnosis not present

## 2017-04-05 DIAGNOSIS — F418 Other specified anxiety disorders: Secondary | ICD-10-CM | POA: Diagnosis not present

## 2017-04-05 DIAGNOSIS — Z6835 Body mass index (BMI) 35.0-35.9, adult: Secondary | ICD-10-CM | POA: Diagnosis not present

## 2017-04-05 DIAGNOSIS — Z881 Allergy status to other antibiotic agents status: Secondary | ICD-10-CM | POA: Diagnosis not present

## 2017-04-05 DIAGNOSIS — M5126 Other intervertebral disc displacement, lumbar region: Secondary | ICD-10-CM | POA: Diagnosis not present

## 2017-04-05 DIAGNOSIS — M5137 Other intervertebral disc degeneration, lumbosacral region: Secondary | ICD-10-CM | POA: Diagnosis not present

## 2017-04-05 DIAGNOSIS — Z79899 Other long term (current) drug therapy: Secondary | ICD-10-CM | POA: Diagnosis not present

## 2017-04-09 ENCOUNTER — Other Ambulatory Visit: Payer: Self-pay | Admitting: Family

## 2017-04-11 DIAGNOSIS — M5416 Radiculopathy, lumbar region: Secondary | ICD-10-CM | POA: Diagnosis not present

## 2017-04-11 NOTE — Telephone Encounter (Signed)
Rx sent to pharmacy. LB 

## 2017-04-19 DIAGNOSIS — M5126 Other intervertebral disc displacement, lumbar region: Secondary | ICD-10-CM | POA: Diagnosis not present

## 2017-04-19 DIAGNOSIS — M7099 Unspecified soft tissue disorder related to use, overuse and pressure multiple sites: Secondary | ICD-10-CM | POA: Diagnosis not present

## 2017-04-19 DIAGNOSIS — M5440 Lumbago with sciatica, unspecified side: Secondary | ICD-10-CM | POA: Diagnosis not present

## 2017-04-19 DIAGNOSIS — R198 Other specified symptoms and signs involving the digestive system and abdomen: Secondary | ICD-10-CM | POA: Diagnosis not present

## 2017-04-19 DIAGNOSIS — G8929 Other chronic pain: Secondary | ICD-10-CM | POA: Diagnosis not present

## 2017-04-19 DIAGNOSIS — M533 Sacrococcygeal disorders, not elsewhere classified: Secondary | ICD-10-CM | POA: Diagnosis not present

## 2017-04-19 DIAGNOSIS — M5416 Radiculopathy, lumbar region: Secondary | ICD-10-CM | POA: Diagnosis not present

## 2017-04-21 DIAGNOSIS — F329 Major depressive disorder, single episode, unspecified: Secondary | ICD-10-CM | POA: Diagnosis not present

## 2017-04-22 ENCOUNTER — Other Ambulatory Visit: Payer: Self-pay | Admitting: Family

## 2017-04-22 NOTE — Telephone Encounter (Signed)
Last methocarbamol rx: 04/11/17, #60. Sent email back to pt to verify if Rx was picked up.

## 2017-04-25 ENCOUNTER — Other Ambulatory Visit: Payer: Self-pay | Admitting: Family

## 2017-04-25 NOTE — Telephone Encounter (Signed)
Melissa-- please advise? 

## 2017-04-26 NOTE — Telephone Encounter (Signed)
She should only take twice daily, ok to send refill but 60 will need to last 30 days.

## 2017-04-27 ENCOUNTER — Other Ambulatory Visit: Payer: Self-pay

## 2017-04-27 MED ORDER — METHOCARBAMOL 750 MG PO TABS: 750.0000 mg | ORAL_TABLET | Freq: Two times a day (BID) | ORAL | 0 refills | Status: DC

## 2017-04-28 DIAGNOSIS — M533 Sacrococcygeal disorders, not elsewhere classified: Secondary | ICD-10-CM | POA: Diagnosis not present

## 2017-04-28 DIAGNOSIS — M7099 Unspecified soft tissue disorder related to use, overuse and pressure multiple sites: Secondary | ICD-10-CM | POA: Diagnosis not present

## 2017-04-28 DIAGNOSIS — M5126 Other intervertebral disc displacement, lumbar region: Secondary | ICD-10-CM | POA: Diagnosis not present

## 2017-04-28 DIAGNOSIS — M5440 Lumbago with sciatica, unspecified side: Secondary | ICD-10-CM | POA: Diagnosis not present

## 2017-04-28 DIAGNOSIS — G8929 Other chronic pain: Secondary | ICD-10-CM | POA: Diagnosis not present

## 2017-04-28 DIAGNOSIS — M5416 Radiculopathy, lumbar region: Secondary | ICD-10-CM | POA: Diagnosis not present

## 2017-04-28 DIAGNOSIS — R198 Other specified symptoms and signs involving the digestive system and abdomen: Secondary | ICD-10-CM | POA: Diagnosis not present

## 2017-05-02 DIAGNOSIS — M7099 Unspecified soft tissue disorder related to use, overuse and pressure multiple sites: Secondary | ICD-10-CM | POA: Diagnosis not present

## 2017-05-02 DIAGNOSIS — M533 Sacrococcygeal disorders, not elsewhere classified: Secondary | ICD-10-CM | POA: Diagnosis not present

## 2017-05-02 DIAGNOSIS — M5126 Other intervertebral disc displacement, lumbar region: Secondary | ICD-10-CM | POA: Diagnosis not present

## 2017-05-02 DIAGNOSIS — M5440 Lumbago with sciatica, unspecified side: Secondary | ICD-10-CM | POA: Diagnosis not present

## 2017-05-02 DIAGNOSIS — R198 Other specified symptoms and signs involving the digestive system and abdomen: Secondary | ICD-10-CM | POA: Diagnosis not present

## 2017-05-02 DIAGNOSIS — M5416 Radiculopathy, lumbar region: Secondary | ICD-10-CM | POA: Diagnosis not present

## 2017-05-02 DIAGNOSIS — G8929 Other chronic pain: Secondary | ICD-10-CM | POA: Diagnosis not present

## 2017-05-04 DIAGNOSIS — G8929 Other chronic pain: Secondary | ICD-10-CM | POA: Diagnosis not present

## 2017-05-04 DIAGNOSIS — M533 Sacrococcygeal disorders, not elsewhere classified: Secondary | ICD-10-CM | POA: Diagnosis not present

## 2017-05-04 DIAGNOSIS — M5416 Radiculopathy, lumbar region: Secondary | ICD-10-CM | POA: Diagnosis not present

## 2017-05-04 DIAGNOSIS — M5126 Other intervertebral disc displacement, lumbar region: Secondary | ICD-10-CM | POA: Diagnosis not present

## 2017-05-04 DIAGNOSIS — R198 Other specified symptoms and signs involving the digestive system and abdomen: Secondary | ICD-10-CM | POA: Diagnosis not present

## 2017-05-04 DIAGNOSIS — M7099 Unspecified soft tissue disorder related to use, overuse and pressure multiple sites: Secondary | ICD-10-CM | POA: Diagnosis not present

## 2017-05-04 DIAGNOSIS — M5440 Lumbago with sciatica, unspecified side: Secondary | ICD-10-CM | POA: Diagnosis not present

## 2017-05-07 ENCOUNTER — Emergency Department (HOSPITAL_BASED_OUTPATIENT_CLINIC_OR_DEPARTMENT_OTHER)
Admission: EM | Admit: 2017-05-07 | Discharge: 2017-05-07 | Disposition: A | Discharge status: Home / Self Care | Payer: BLUE CROSS/BLUE SHIELD | Attending: Emergency Medicine | Admitting: Emergency Medicine

## 2017-05-07 ENCOUNTER — Encounter (HOSPITAL_BASED_OUTPATIENT_CLINIC_OR_DEPARTMENT_OTHER): Payer: Self-pay | Admitting: *Deleted

## 2017-05-07 DIAGNOSIS — Z79899 Other long term (current) drug therapy: Secondary | ICD-10-CM | POA: Insufficient documentation

## 2017-05-07 DIAGNOSIS — Z87891 Personal history of nicotine dependence: Secondary | ICD-10-CM | POA: Insufficient documentation

## 2017-05-07 DIAGNOSIS — R519 Headache, unspecified: Secondary | ICD-10-CM

## 2017-05-07 DIAGNOSIS — R51 Headache: Secondary | ICD-10-CM | POA: Diagnosis not present

## 2017-05-07 MED ORDER — KETOROLAC TROMETHAMINE 30 MG/ML IJ SOLN
30.0000 mg | Freq: Once | INTRAMUSCULAR | Status: AC
  Administered 2017-05-07: 30 mg via INTRAVENOUS
  Filled 2017-05-07: qty 1

## 2017-05-07 MED ORDER — ONDANSETRON HCL 4 MG/2ML IJ SOLN
INTRAMUSCULAR | Status: AC
  Filled 2017-05-07: qty 2

## 2017-05-07 MED ORDER — ONDANSETRON HCL 4 MG/2ML IJ SOLN
4.0000 mg | Freq: Once | INTRAMUSCULAR | Status: AC
  Administered 2017-05-07: 4 mg via INTRAVENOUS

## 2017-05-07 MED ORDER — METOCLOPRAMIDE HCL 5 MG/ML IJ SOLN
10.0000 mg | Freq: Once | INTRAMUSCULAR | Status: AC
  Administered 2017-05-07: 10 mg via INTRAVENOUS
  Filled 2017-05-07: qty 2

## 2017-05-07 MED ORDER — DIPHENHYDRAMINE HCL 50 MG/ML IJ SOLN
25.0000 mg | Freq: Once | INTRAMUSCULAR | Status: AC
  Administered 2017-05-07: 25 mg via INTRAVENOUS
  Filled 2017-05-07: qty 1

## 2017-05-07 MED ORDER — DEXAMETHASONE SODIUM PHOSPHATE 10 MG/ML IJ SOLN
10.0000 mg | Freq: Once | INTRAMUSCULAR | Status: AC
  Administered 2017-05-07: 10 mg via INTRAVENOUS
  Filled 2017-05-07: qty 1

## 2017-05-07 MED ORDER — SODIUM CHLORIDE 0.9 % IV BOLUS (SEPSIS)
1000.0000 mL | Freq: Once | INTRAVENOUS | Status: AC
Start: 2017-05-07 — End: 2017-05-07
  Administered 2017-05-07: 1000 mL via INTRAVENOUS

## 2017-05-07 NOTE — Discharge Instructions (Signed)
Continue medications as previously prescribed.  Return to the emergency department if symptoms significantly worsen or change. 

## 2017-05-07 NOTE — ED Provider Notes (Signed)
[facility] DEPT [facility] Provider Note   CSN: [id number] Arrival date & time: 05/07/17  0203     History   Chief Complaint Chief Complaint  Patient presents with  . Headache    HPI Gina James is a 36 y.o. female.  Patient is a 36 year old female with past medical history of headaches and low back pain. Presents today for evaluation of severe headache that started earlier this evening. It encompasses her whole head and is associated with nausea, vomiting, and photophobia. She typically takes ibuprofen gels at home, however this has not worked this evening. She denies any fevers, chills, stiff neck, or head trauma.   The history is provided by the patient.  Headache   This is a recurrent problem. The current episode started 3 to 5 hours ago. The problem occurs constantly. The problem has been rapidly worsening. The headache is associated with bright light. The quality of the pain is described as throbbing. The pain is severe. The pain does not radiate. Associated symptoms include nausea and vomiting. Pertinent negatives include no anorexia. She has tried NSAIDs for the symptoms. The treatment provided no relief.    Past Medical History:  Diagnosis Date  . Ankle fracture, right   . Anxiety   . Arthritis    "in my back" (04/08/2015)  . Chronic lower back pain   . Complication of anesthesia    difficulty urinating after anesthesia  . Headache    "monthly" (04/08/2015)  . Post partum depression     Patient Active Problem List   Diagnosis Date Noted  . Spasm of back muscles 11/03/2016  . Depression with anxiety 08/04/2016  . 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 PMDD (premenstrual dysphoric disorder) 07/29/2015  . Pilon fracture 04/08/2015  . Skin lesion 11/19/2014  . Preventative health care 10/29/2014  . Obesity 06/10/2014  . Chronic meniscal tear of knee 11/23/2013  . Lumbar pain with radiation down both legs 09/11/2013  . Generalized anxiety disorder  06/18/2013    Past Surgical History:  Procedure Laterality Date  . BREAST SURGERY    . CESAREAN SECTION  2007 and 2009  . COMBINED ABDOMINOPLASTY AND LIPOSUCTION  12/2014   "liposuction in thighs"  . FRACTURE SURGERY    . LAPAROSCOPIC ABDOMINAL EXPLORATION  1997   "to check for endometriosis"  . LAPAROSCOPIC CHOLECYSTECTOMY  2007  . OPEN REDUCTION INTERNAL FIXATION (ORIF) TIBIA/FIBULA FRACTURE Right 04/08/2015   Procedure: OPEN REDUCTION INTERNAL FIXATION (ORIF)  RIGHT TIBIA PILON FRACTURE;  Surgeon: Wylene Simmer, MD;  Location: [facility] [place];  Service: Orthopedics;  Laterality: Right;  . TIBIA FRACTURE SURGERY Right 04/08/2015   ORIF tibial pilon    OB History    No data available       Home Medications    Prior to Admission medications   Medication Sig Start Date End Date Taking? Authorizing Provider  escitalopram (LEXAPRO) 20 MG tablet Take 1 tablet (20 mg total) by mouth daily. 11/03/16   Debbrah Alar, NP  gabapentin (NEURONTIN) 800 MG tablet Take 800 mg by mouth 3 (three) times daily. 01/28/17   [provider]  JOLESSA 0.15-0.03 MG tablet TAKE ONE TABLET BY MOUTH ONCE DAILY 04/26/17   Debbrah Alar, NP  L-FORMULA LYSINE HCL PO Take 1 tablet by mouth daily.    [provider]  methocarbamol (ROBAXIN) 750 MG tablet Take 1 tablet (750 mg total) by mouth 2 (two) times daily. 04/27/17   Debbrah Alar, NP  Multiple Vitamins-Minerals (MULTIVITAMIN WITH  MINERALS) tablet Take 1 tablet by mouth daily.    [provider]  OVER THE COUNTER MEDICATION [place].  Take 2 capsules daily.    [provider]  pyridOXINE (VITAMIN B-6) 100 MG tablet Take 1,000 mg by mouth daily.    [provider]  valACYclovir (VALTREX) 500 MG tablet TAKE ONE TABLET BY MOUTH ONCE DAILY 04/26/17   Debbrah Alar, NP  venlafaxine XR (EFFEXOR-XR) 150 MG 24 hr capsule TAKE ONE CAPSULE BY MOUTH ONCE DAILY 01/18/17   Debbrah Alar, NP    Family  History Family History  Problem Relation Age of Onset  . Diabetes Father   . Cancer Sister 30       breast, had chemo/double mastectomy  . Heart disease Maternal Grandfather   . Heart disease Paternal Grandfather     Social History Social History  Substance Use Topics  . Smoking status: Former Smoker    Packs/day: 0.12    Years: 10.00    Types: Cigarettes    Quit date: 09/06/2004  . Smokeless tobacco: Never Used  . Alcohol use 2.4 oz/week    4 Cans of beer per week     Comment: 04/08/2015 "drink only the weekends"     Allergies   Amoxicillin; Ceclor [cefaclor]; Erythromycin base; Penicillins; Phenergan [promethazine hcl]; Sulfa antibiotics; and Zithromax [azithromycin]   Review of Systems Review of Systems  Gastrointestinal: Positive for nausea and vomiting. Negative for anorexia.  Neurological: Positive for headaches.  All other systems reviewed and are negative.    Physical Exam Updated Vital Signs BP 121/89   Pulse 69   Temp 98.2 F (36.8 C) (Oral)   Resp 18   Ht 5\' 4"  (1.626 m)   Wt 93.4 kg (206 lb)   LMP 04/16/2017   SpO2 100%   BMI 35.36 kg/m   Physical Exam  Constitutional: She is oriented to person, place, and time. She appears well-developed and well-nourished. No distress.  HENT:  Head: Normocephalic and atraumatic.  Mouth/Throat: Oropharynx is clear and moist.  Eyes: Pupils are equal, round, and reactive to light. EOM are normal.  Neck: Normal range of motion. Neck supple.  Cardiovascular: Normal rate and regular rhythm.  Exam reveals no gallop and no friction rub.   No murmur heard. Pulmonary/Chest: Effort normal and breath sounds normal. No respiratory distress. She has no wheezes.  Abdominal: Soft. Bowel sounds are normal. She exhibits no distension. There is no tenderness.  Musculoskeletal: Normal range of motion.  Neurological: She is alert and oriented to person, place, and time. No cranial nerve deficit. She exhibits normal muscle tone.  Coordination normal.  Skin: Skin is warm and dry. She is not diaphoretic.  Nursing note and vitals reviewed.    ED Treatments / Results  Labs (all labs ordered are listed, but only abnormal results are displayed) Labs Reviewed - No data to display  EKG  EKG Interpretation None       Radiology No results found.  Procedures Procedures (including critical care time)  Medications Ordered in ED Medications  sodium chloride 0.9 % bolus 1,000 mL (not administered)  ketorolac (TORADOL) 30 MG/ML injection 30 mg (not administered)  dexamethasone (DECADRON) injection 10 mg (not administered)  diphenhydrAMINE (BENADRYL) injection 25 mg (not administered)  metoCLOPramide (REGLAN) injection 10 mg (not administered)  ondansetron (ZOFRAN) injection 4 mg (4 mg Intravenous Given 05/07/17 0227)     Initial Impression / Assessment and Plan / ED Course  I have reviewed the triage vital signs and  the nursing notes.  Pertinent labs & imaging results that were available during my care of the patient were reviewed by me and considered in my medical decision making (see chart for details).  Patient symptoms completely resolved after IV fluids and a migraine cocktail. Her neurologic exam is nonfocal and I see no indication for LP or CT scan. She will be discharged to return as needed.  Final Clinical Impressions(s) / ED Diagnoses   Final diagnoses:  None    New Prescriptions New Prescriptions   No medications on file     Veryl Speak, MD 05/07/17 [phone]

## 2017-05-07 NOTE — ED Triage Notes (Signed)
Pt with hx of migraine HA this episode began yesterday PM associated with N/V

## 2017-05-10 DIAGNOSIS — M533 Sacrococcygeal disorders, not elsewhere classified: Secondary | ICD-10-CM | POA: Insufficient documentation

## 2017-05-10 DIAGNOSIS — M5416 Radiculopathy, lumbar region: Secondary | ICD-10-CM | POA: Diagnosis not present

## 2017-05-10 DIAGNOSIS — M549 Dorsalgia, unspecified: Secondary | ICD-10-CM | POA: Diagnosis not present

## 2017-05-11 DIAGNOSIS — F329 Major depressive disorder, single episode, unspecified: Secondary | ICD-10-CM | POA: Diagnosis not present

## 2017-05-11 DIAGNOSIS — M5416 Radiculopathy, lumbar region: Secondary | ICD-10-CM | POA: Diagnosis not present

## 2017-05-11 DIAGNOSIS — M533 Sacrococcygeal disorders, not elsewhere classified: Secondary | ICD-10-CM | POA: Diagnosis not present

## 2017-05-11 DIAGNOSIS — M5126 Other intervertebral disc displacement, lumbar region: Secondary | ICD-10-CM | POA: Diagnosis not present

## 2017-05-11 DIAGNOSIS — G8929 Other chronic pain: Secondary | ICD-10-CM | POA: Diagnosis not present

## 2017-05-11 DIAGNOSIS — M7099 Unspecified soft tissue disorder related to use, overuse and pressure multiple sites: Secondary | ICD-10-CM | POA: Diagnosis not present

## 2017-05-11 DIAGNOSIS — M5440 Lumbago with sciatica, unspecified side: Secondary | ICD-10-CM | POA: Diagnosis not present

## 2017-05-11 DIAGNOSIS — R198 Other specified symptoms and signs involving the digestive system and abdomen: Secondary | ICD-10-CM | POA: Diagnosis not present

## 2017-05-13 DIAGNOSIS — R198 Other specified symptoms and signs involving the digestive system and abdomen: Secondary | ICD-10-CM | POA: Diagnosis not present

## 2017-05-13 DIAGNOSIS — M533 Sacrococcygeal disorders, not elsewhere classified: Secondary | ICD-10-CM | POA: Diagnosis not present

## 2017-05-13 DIAGNOSIS — G8929 Other chronic pain: Secondary | ICD-10-CM | POA: Diagnosis not present

## 2017-05-13 DIAGNOSIS — M5416 Radiculopathy, lumbar region: Secondary | ICD-10-CM | POA: Diagnosis not present

## 2017-05-13 DIAGNOSIS — M5440 Lumbago with sciatica, unspecified side: Secondary | ICD-10-CM | POA: Diagnosis not present

## 2017-05-13 DIAGNOSIS — M5126 Other intervertebral disc displacement, lumbar region: Secondary | ICD-10-CM | POA: Diagnosis not present

## 2017-05-13 DIAGNOSIS — M7099 Unspecified soft tissue disorder related to use, overuse and pressure multiple sites: Secondary | ICD-10-CM | POA: Diagnosis not present

## 2017-05-16 DIAGNOSIS — R198 Other specified symptoms and signs involving the digestive system and abdomen: Secondary | ICD-10-CM | POA: Diagnosis not present

## 2017-05-16 DIAGNOSIS — M533 Sacrococcygeal disorders, not elsewhere classified: Secondary | ICD-10-CM | POA: Diagnosis not present

## 2017-05-16 DIAGNOSIS — M5440 Lumbago with sciatica, unspecified side: Secondary | ICD-10-CM | POA: Diagnosis not present

## 2017-05-16 DIAGNOSIS — G8929 Other chronic pain: Secondary | ICD-10-CM | POA: Diagnosis not present

## 2017-05-16 DIAGNOSIS — M5126 Other intervertebral disc displacement, lumbar region: Secondary | ICD-10-CM | POA: Diagnosis not present

## 2017-05-16 DIAGNOSIS — M5416 Radiculopathy, lumbar region: Secondary | ICD-10-CM | POA: Diagnosis not present

## 2017-05-16 DIAGNOSIS — M7099 Unspecified soft tissue disorder related to use, overuse and pressure multiple sites: Secondary | ICD-10-CM | POA: Diagnosis not present

## 2017-05-18 DIAGNOSIS — R198 Other specified symptoms and signs involving the digestive system and abdomen: Secondary | ICD-10-CM | POA: Diagnosis not present

## 2017-05-18 DIAGNOSIS — M7099 Unspecified soft tissue disorder related to use, overuse and pressure multiple sites: Secondary | ICD-10-CM | POA: Diagnosis not present

## 2017-05-18 DIAGNOSIS — M5416 Radiculopathy, lumbar region: Secondary | ICD-10-CM | POA: Diagnosis not present

## 2017-05-18 DIAGNOSIS — M5126 Other intervertebral disc displacement, lumbar region: Secondary | ICD-10-CM | POA: Diagnosis not present

## 2017-05-18 DIAGNOSIS — M533 Sacrococcygeal disorders, not elsewhere classified: Secondary | ICD-10-CM | POA: Diagnosis not present

## 2017-05-18 DIAGNOSIS — M5440 Lumbago with sciatica, unspecified side: Secondary | ICD-10-CM | POA: Diagnosis not present

## 2017-05-18 DIAGNOSIS — G8929 Other chronic pain: Secondary | ICD-10-CM | POA: Diagnosis not present

## 2017-05-19 ENCOUNTER — Other Ambulatory Visit: Payer: Self-pay | Admitting: Family

## 2017-05-19 DIAGNOSIS — M5136 Other intervertebral disc degeneration, lumbar region: Secondary | ICD-10-CM | POA: Diagnosis not present

## 2017-05-20 MED ORDER — METHOCARBAMOL 750 MG PO TABS: 750.0000 mg | ORAL_TABLET | Freq: Two times a day (BID) | ORAL | 0 refills | Status: DC

## 2017-05-25 DIAGNOSIS — M533 Sacrococcygeal disorders, not elsewhere classified: Secondary | ICD-10-CM | POA: Diagnosis not present

## 2017-05-25 DIAGNOSIS — R198 Other specified symptoms and signs involving the digestive system and abdomen: Secondary | ICD-10-CM | POA: Diagnosis not present

## 2017-05-25 DIAGNOSIS — M5416 Radiculopathy, lumbar region: Secondary | ICD-10-CM | POA: Diagnosis not present

## 2017-05-25 DIAGNOSIS — M5126 Other intervertebral disc displacement, lumbar region: Secondary | ICD-10-CM | POA: Diagnosis not present

## 2017-05-25 DIAGNOSIS — M7099 Unspecified soft tissue disorder related to use, overuse and pressure multiple sites: Secondary | ICD-10-CM | POA: Diagnosis not present

## 2017-05-25 DIAGNOSIS — G8929 Other chronic pain: Secondary | ICD-10-CM | POA: Diagnosis not present

## 2017-05-25 DIAGNOSIS — M5440 Lumbago with sciatica, unspecified side: Secondary | ICD-10-CM | POA: Diagnosis not present

## 2017-05-27 DIAGNOSIS — M7099 Unspecified soft tissue disorder related to use, overuse and pressure multiple sites: Secondary | ICD-10-CM | POA: Diagnosis not present

## 2017-05-27 DIAGNOSIS — M5440 Lumbago with sciatica, unspecified side: Secondary | ICD-10-CM | POA: Diagnosis not present

## 2017-05-27 DIAGNOSIS — R198 Other specified symptoms and signs involving the digestive system and abdomen: Secondary | ICD-10-CM | POA: Diagnosis not present

## 2017-05-27 DIAGNOSIS — M5416 Radiculopathy, lumbar region: Secondary | ICD-10-CM | POA: Diagnosis not present

## 2017-05-27 DIAGNOSIS — M533 Sacrococcygeal disorders, not elsewhere classified: Secondary | ICD-10-CM | POA: Diagnosis not present

## 2017-05-27 DIAGNOSIS — M5126 Other intervertebral disc displacement, lumbar region: Secondary | ICD-10-CM | POA: Diagnosis not present

## 2017-05-27 DIAGNOSIS — G8929 Other chronic pain: Secondary | ICD-10-CM | POA: Diagnosis not present

## 2017-05-27 DIAGNOSIS — F329 Major depressive disorder, single episode, unspecified: Secondary | ICD-10-CM | POA: Diagnosis not present

## 2017-05-28 ENCOUNTER — Encounter: Payer: Self-pay | Admitting: Family

## 2017-05-30 ENCOUNTER — Other Ambulatory Visit: Payer: Self-pay | Admitting: Family

## 2017-05-30 MED ORDER — METHOCARBAMOL 750 MG PO TABS: 750.0000 mg | ORAL_TABLET | Freq: Two times a day (BID) | ORAL | 0 refills | Status: DC

## 2017-05-31 ENCOUNTER — Ambulatory Visit (INDEPENDENT_AMBULATORY_CARE_PROVIDER_SITE_OTHER): Payer: BLUE CROSS/BLUE SHIELD | Admitting: Family

## 2017-05-31 ENCOUNTER — Encounter: Payer: Self-pay | Admitting: Family

## 2017-05-31 VITALS — BP 127/59 | HR 70 | Temp 98.6°F | Resp 18 | Ht 64.0 in | Wt 213.8 lb

## 2017-05-31 DIAGNOSIS — F418 Other specified anxiety disorders: Secondary | ICD-10-CM | POA: Diagnosis not present

## 2017-05-31 MED ORDER — BUPROPION HCL ER (XL) 150 MG PO TB24: 150.0000 mg | ORAL_TABLET | Freq: Every day | ORAL | 0 refills | Status: DC

## 2017-05-31 NOTE — Assessment & Plan Note (Addendum)
Depression is uncontrolled. She scored 20 today on the pH Q9. Previously while on Lexapro she scored 13 on pH Q9. I asked her if she had scheduled an appointment with psychiatry. She stated she did not schedule with psychiatry because she was nervous about this. She was able to contract for safety in the event that she has active suicidal ideation. She agrees to seek help if active suicidal ideation. Will initiate Wellbutrin. Continue effexor. She is advised to schedule an appointment with psychiatry. We'll plan to see her back in one month for follow-up.

## 2017-05-31 NOTE — Patient Instructions (Addendum)
Continue effexor. Begin Wellbutrin for depression. Call 911 if you have active thoughts of hurting yourself or others.   Schedule an appointment with psychiatry.  Psychiatric Services:  [facility], [place] [address] [place], [place] [facility] [place], [place], [facility], [address] [place], [place]

## 2017-05-31 NOTE — Progress Notes (Signed)
Subjective:    Patient ID: Gina James, female    DOB: Jan 12, 1981, 36 y.o.   MRN: [id number]  HPI  [name] is a 36 yr old female who presents today for follow up of her depression.  Reports that she has been on lexapro now since February. Reports that she had mild nausea on the lower dose.  Nausea worsened after she began gabapentin.  She stopped lexapro and nausea resolved.  She stopped on 05/10/17.  Since stopping the lexapro she has noted a deterioration in her mood.  She reports that she   Feels very overwhelmed due to her chronic pain. She reports that she is scheduled for upcoming back surgery. She feels like she is not able to be very emotionally or physically for her children due to her worsening depression symptoms. She reports that she told her husband, "it's a good thing we don't have a gun or I might use it (to shoot herself)." She denies current thoughts of hurting herself today. She denies having access to gun. She reports that if she did have active thoughts of hurting herself that she feels confident that she would let her husband know and call 911. She reports that she had no thoughts of hurting herself when she was still maintained on Lexapro. She reports that she and her husband reconciled in July.  She is glad about this and reports that her children are also happy. She is going to therapy. "There is a lot of healing going on."  She is seeing Aquilla Solian for counseling.  Review of Systems See HPI  Past Medical History:  Diagnosis Date  . Ankle fracture, right   . Anxiety   . Arthritis    "in my back" (04/08/2015)  . Chronic lower back pain   . Complication of anesthesia    difficulty urinating after anesthesia  . Headache    "monthly" (04/08/2015)  . Post partum depression      Social History   Social History  . Marital status: Married    Spouse name: N/A  . Number of children: N/A  . Years of education: N/A   Occupational History  . Not on file.    Social History Main Topics  . Smoking status: Former Smoker    Packs/day: 0.12    Years: 10.00    Types: Cigarettes    Quit date: 09/06/2004  . Smokeless tobacco: Never Used  . Alcohol use 2.4 oz/week    4 Cans of beer per week     Comment: 04/08/2015 "drink only the weekends"  . Drug use: No  . Sexual activity: Yes     Comment: vasectomy in partner   Other Topics Concern  . 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           Past Surgical History:  Procedure Laterality Date  . BREAST SURGERY    . CESAREAN SECTION  2007 and 2009  . COMBINED ABDOMINOPLASTY AND LIPOSUCTION  12/2014   "liposuction in thighs"  . FRACTURE SURGERY    . LAPAROSCOPIC ABDOMINAL EXPLORATION  1997   "to check for endometriosis"  . LAPAROSCOPIC CHOLECYSTECTOMY  2007  . OPEN REDUCTION INTERNAL FIXATION (ORIF) TIBIA/FIBULA FRACTURE Right 04/08/2015   Procedure: OPEN REDUCTION INTERNAL FIXATION (ORIF)  RIGHT TIBIA PILON FRACTURE;  Surgeon: Wylene Simmer, MD;  Location: [facility];  Service: Orthopedics;  Laterality: Right;  . TIBIA FRACTURE SURGERY Right 04/08/2015   ORIF tibial pilon    Family History  Problem Relation Age of Onset  . Diabetes Father   . Cancer Sister 30       breast, had chemo/double mastectomy  . Heart disease Maternal Grandfather   . Heart disease Paternal Grandfather     Allergies  Allergen Reactions  . Lexapro [Escitalopram] Nausea And Vomiting  . Amoxicillin Hives  . Ceclor [Cefaclor] Hives  . Erythromycin Base Rash  . Penicillins Hives  . Phenergan [Promethazine Hcl] Hives  . Sulfa Antibiotics Hives  . Zithromax [Azithromycin] Hives    Current Outpatient Prescriptions on File Prior to Visit  Medication Sig Dispense Refill  . gabapentin (NEURONTIN) 800 MG tablet Take 800 mg by mouth 3 (three)  times daily.    . JOLESSA 0.15-0.03 MG tablet TAKE ONE TABLET BY MOUTH ONCE DAILY 91 tablet 4  . L-FORMULA LYSINE HCL PO Take 1 tablet by mouth daily.    . methocarbamol (ROBAXIN) 750 MG tablet Take 1 tablet (750 mg total) by mouth 2 (two) times daily. 60 tablet 0  . Multiple Vitamins-Minerals (MULTIVITAMIN WITH MINERALS) tablet Take 1 tablet by mouth daily.    Marland Kitchen OVER THE COUNTER MEDICATION TUMERIC.  Take 2 capsules daily.    Marland Kitchen pyridOXINE (VITAMIN B-6) 100 MG tablet Take 1,000 mg by mouth daily.    . valACYclovir (VALTREX) 500 MG tablet TAKE ONE TABLET BY MOUTH ONCE DAILY 30 tablet 5  . venlafaxine XR (EFFEXOR-XR) 150 MG 24 hr capsule TAKE ONE CAPSULE BY MOUTH ONCE DAILY 30 capsule 5   No current facility-administered medications on file prior to visit.     BP (!) 127/59 (BP Location: Right Arm, Cuff Size: Large)   Pulse 70   Temp 98.6 F (37 C) (Oral)   Resp 18   Ht 5\' 4"  (1.626 m)   Wt 213 lb 12.8 oz (97 kg)   SpO2 97%   BMI 36.70 kg/m       Objective:   Physical Exam  Constitutional: She is oriented to person, place, and time. She appears well-developed and well-nourished.  HENT:  Head: Normocephalic and atraumatic.  Cardiovascular: Normal rate, regular rhythm and normal heart sounds.   No murmur heard. Pulmonary/Chest: Effort normal and breath sounds normal. No respiratory distress. She has no wheezes.  Musculoskeletal: She exhibits no edema.  Neurological: She is alert and oriented to person, place, and time.  Skin: Skin is warm and dry.  Psychiatric: She has a normal mood and affect. Her behavior is normal. Judgment and thought content normal.          Assessment & Plan:

## 2017-06-01 DIAGNOSIS — M5416 Radiculopathy, lumbar region: Secondary | ICD-10-CM | POA: Diagnosis not present

## 2017-06-01 DIAGNOSIS — R198 Other specified symptoms and signs involving the digestive system and abdomen: Secondary | ICD-10-CM | POA: Diagnosis not present

## 2017-06-01 DIAGNOSIS — F329 Major depressive disorder, single episode, unspecified: Secondary | ICD-10-CM | POA: Diagnosis not present

## 2017-06-01 DIAGNOSIS — M7099 Unspecified soft tissue disorder related to use, overuse and pressure multiple sites: Secondary | ICD-10-CM | POA: Diagnosis not present

## 2017-06-01 DIAGNOSIS — M5440 Lumbago with sciatica, unspecified side: Secondary | ICD-10-CM | POA: Diagnosis not present

## 2017-06-01 DIAGNOSIS — M533 Sacrococcygeal disorders, not elsewhere classified: Secondary | ICD-10-CM | POA: Diagnosis not present

## 2017-06-01 DIAGNOSIS — G8929 Other chronic pain: Secondary | ICD-10-CM | POA: Diagnosis not present

## 2017-06-01 DIAGNOSIS — M5126 Other intervertebral disc displacement, lumbar region: Secondary | ICD-10-CM | POA: Diagnosis not present

## 2017-06-06 DIAGNOSIS — G8929 Other chronic pain: Secondary | ICD-10-CM | POA: Diagnosis not present

## 2017-06-06 DIAGNOSIS — M5126 Other intervertebral disc displacement, lumbar region: Secondary | ICD-10-CM | POA: Diagnosis not present

## 2017-06-06 DIAGNOSIS — M533 Sacrococcygeal disorders, not elsewhere classified: Secondary | ICD-10-CM | POA: Diagnosis not present

## 2017-06-06 DIAGNOSIS — M7099 Unspecified soft tissue disorder related to use, overuse and pressure multiple sites: Secondary | ICD-10-CM | POA: Diagnosis not present

## 2017-06-06 DIAGNOSIS — M5440 Lumbago with sciatica, unspecified side: Secondary | ICD-10-CM | POA: Diagnosis not present

## 2017-06-06 DIAGNOSIS — R198 Other specified symptoms and signs involving the digestive system and abdomen: Secondary | ICD-10-CM | POA: Diagnosis not present

## 2017-06-06 DIAGNOSIS — M5416 Radiculopathy, lumbar region: Secondary | ICD-10-CM | POA: Diagnosis not present

## 2017-06-07 ENCOUNTER — Ambulatory Visit: Payer: BLUE CROSS/BLUE SHIELD | Admitting: Family

## 2017-06-08 DIAGNOSIS — R198 Other specified symptoms and signs involving the digestive system and abdomen: Secondary | ICD-10-CM | POA: Diagnosis not present

## 2017-06-08 DIAGNOSIS — M7099 Unspecified soft tissue disorder related to use, overuse and pressure multiple sites: Secondary | ICD-10-CM | POA: Diagnosis not present

## 2017-06-08 DIAGNOSIS — M5416 Radiculopathy, lumbar region: Secondary | ICD-10-CM | POA: Diagnosis not present

## 2017-06-08 DIAGNOSIS — M5440 Lumbago with sciatica, unspecified side: Secondary | ICD-10-CM | POA: Diagnosis not present

## 2017-06-08 DIAGNOSIS — M533 Sacrococcygeal disorders, not elsewhere classified: Secondary | ICD-10-CM | POA: Diagnosis not present

## 2017-06-08 DIAGNOSIS — M5126 Other intervertebral disc displacement, lumbar region: Secondary | ICD-10-CM | POA: Diagnosis not present

## 2017-06-08 DIAGNOSIS — G8929 Other chronic pain: Secondary | ICD-10-CM | POA: Diagnosis not present

## 2017-06-09 DIAGNOSIS — M533 Sacrococcygeal disorders, not elsewhere classified: Secondary | ICD-10-CM | POA: Diagnosis not present

## 2017-06-15 DIAGNOSIS — M5416 Radiculopathy, lumbar region: Secondary | ICD-10-CM | POA: Diagnosis not present

## 2017-06-15 DIAGNOSIS — M533 Sacrococcygeal disorders, not elsewhere classified: Secondary | ICD-10-CM | POA: Diagnosis not present

## 2017-06-15 DIAGNOSIS — Z01812 Encounter for preprocedural laboratory examination: Secondary | ICD-10-CM | POA: Diagnosis not present

## 2017-06-22 DIAGNOSIS — F329 Major depressive disorder, single episode, unspecified: Secondary | ICD-10-CM | POA: Diagnosis not present

## 2017-06-27 ENCOUNTER — Ambulatory Visit (INDEPENDENT_AMBULATORY_CARE_PROVIDER_SITE_OTHER): Payer: BLUE CROSS/BLUE SHIELD | Admitting: Family

## 2017-06-27 ENCOUNTER — Encounter: Payer: Self-pay | Admitting: Family

## 2017-06-27 DIAGNOSIS — F418 Other specified anxiety disorders: Secondary | ICD-10-CM

## 2017-06-27 MED ORDER — VENLAFAXINE HCL ER 150 MG PO CP24: 150.0000 mg | ORAL_CAPSULE | Freq: Every day | ORAL | 1 refills | Status: DC

## 2017-06-27 MED ORDER — BUPROPION HCL ER (XL) 150 MG PO TB24: 150.0000 mg | ORAL_TABLET | Freq: Every day | ORAL | 1 refills | Status: DC

## 2017-06-27 NOTE — Patient Instructions (Signed)
Continue current medications. Continue your work with Aquilla Solian. Please schedule an appointment with psychiatry.  Psychiatric Services:  [facility], [place] [address], [place], [facility] [place], [place], [facility], [address] [place], [place]

## 2017-06-27 NOTE — Assessment & Plan Note (Signed)
PHQ-9 is 6 today. Much improved on wellbutrin. Continue along with effexor. She is advised to continue her work with her counselor and to establish with psychiatry.

## 2017-06-27 NOTE — Progress Notes (Signed)
Subjective:    Patient ID: Gina James, female    DOB: Jul 28, 1981, 36 y.o.   MRN: [id number]  HPI  Patient is a 36 yr old female who presents today for follow up of her anxiety/depression.  Last visit she scored 20 on PHQ-9. We added wellbutrin to her effexor. She was advised to schedule an appointment with psychiatry. She is scheduled for back surgery on Halloween.    She reports that she is feeling "amazing." reports that she is no longer afraid to leave the house.  Mood is much improved.  Denies suicide ideation.    Review of Systems See HPI  Past Medical History:  Diagnosis Date  . Ankle fracture, right   . Anxiety   . Arthritis    "in my back" (04/08/2015)  . Chronic lower back pain   . Complication of anesthesia    difficulty urinating after anesthesia  . Headache    "monthly" (04/08/2015)  . Post partum depression      Social History   Social History  . Marital status: Married    Spouse name: N/A  . Number of children: N/A  . Years of education: N/A   Occupational History  . Not on file.   Social History Main Topics  . Smoking status: Former Smoker    Packs/day: 0.12    Years: 10.00    Types: Cigarettes    Quit date: 09/06/2004  . Smokeless tobacco: Never Used  . Alcohol use 2.4 oz/week    4 Cans of beer per week     Comment: 04/08/2015 "drink only the weekends"  . Drug use: No  . Sexual activity: Yes     Comment: vasectomy in partner   Other Topics Concern  . 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           Past Surgical History:  Procedure Laterality Date  . BREAST SURGERY    . CESAREAN SECTION  2007 and 2009  . COMBINED ABDOMINOPLASTY AND LIPOSUCTION  12/2014   "liposuction in thighs"  . FRACTURE SURGERY    . LAPAROSCOPIC ABDOMINAL EXPLORATION  1997   "to  check for endometriosis"  . LAPAROSCOPIC CHOLECYSTECTOMY  2007  . OPEN REDUCTION INTERNAL FIXATION (ORIF) TIBIA/FIBULA FRACTURE Right 04/08/2015   Procedure: OPEN REDUCTION INTERNAL FIXATION (ORIF)  RIGHT TIBIA PILON FRACTURE;  Surgeon: Wylene Simmer, MD;  Location: [facility];  Service: Orthopedics;  Laterality: Right;  . TIBIA FRACTURE SURGERY Right 04/08/2015   ORIF tibial pilon    Family History  Problem Relation Age of Onset  . Diabetes Father   . Cancer Sister 30       breast, had chemo/double mastectomy  . Heart disease Maternal Grandfather   . Heart disease Paternal Grandfather     Allergies  Allergen Reactions  . Lexapro [Escitalopram] Nausea And Vomiting  . Amoxicillin Hives  . Ceclor [Cefaclor] Hives  . Erythromycin Base Rash  . Penicillins Hives  . Phenergan [Promethazine Hcl] Hives  . Sulfa Antibiotics Hives  . Zithromax [Azithromycin] Hives    Current Outpatient Prescriptions on File Prior to Visit  Medication Sig Dispense Refill  . buPROPion (WELLBUTRIN XL) 150 MG 24 hr tablet Take 1 tablet (150 mg total) by mouth daily. 30 tablet 0  .  gabapentin (NEURONTIN) 800 MG tablet Take 800 mg by mouth 3 (three) times daily.    . JOLESSA 0.15-0.03 MG tablet TAKE ONE TABLET BY MOUTH ONCE DAILY 91 tablet 4  . L-FORMULA LYSINE HCL PO Take 1 tablet by mouth daily.    . methocarbamol (ROBAXIN) 750 MG tablet Take 1 tablet (750 mg total) by mouth 2 (two) times daily. 60 tablet 0  . Multiple Vitamins-Minerals (MULTIVITAMIN WITH MINERALS) tablet Take 1 tablet by mouth daily.    Marland Kitchen OVER THE COUNTER MEDICATION TUMERIC.  Take 2 capsules daily.    Marland Kitchen pyridOXINE (VITAMIN B-6) 100 MG tablet Take 1,000 mg by mouth daily.    . valACYclovir (VALTREX) 500 MG tablet TAKE ONE TABLET BY MOUTH ONCE DAILY 30 tablet 5  . venlafaxine XR (EFFEXOR-XR) 150 MG 24 hr capsule TAKE ONE CAPSULE BY MOUTH ONCE DAILY 30 capsule 5   No current facility-administered medications on file prior to visit.     BP 122/78  (BP Location: Right Arm, Cuff Size: Large)   Pulse 83   Temp 99.2 F (37.3 C) (Oral)   Resp 16   Ht 5\' 4"  (1.626 m)   Wt 218 lb 12.8 oz (99.2 kg)   SpO2 99%   BMI 37.56 kg/m       Objective:   Physical Exam  Constitutional: She is oriented to person, place, and time. She appears well-developed and well-nourished. No distress.  Neurological: She is alert and oriented to person, place, and time.  Psychiatric: She has a normal mood and affect. Her behavior is normal. Judgment and thought content normal.          Assessment & Plan:

## 2017-07-06 DIAGNOSIS — M4317 Spondylolisthesis, lumbosacral region: Secondary | ICD-10-CM | POA: Diagnosis not present

## 2017-07-06 DIAGNOSIS — E669 Obesity, unspecified: Secondary | ICD-10-CM | POA: Diagnosis not present

## 2017-07-06 DIAGNOSIS — M5117 Intervertebral disc disorders with radiculopathy, lumbosacral region: Secondary | ICD-10-CM | POA: Diagnosis not present

## 2017-07-06 DIAGNOSIS — F419 Anxiety disorder, unspecified: Secondary | ICD-10-CM | POA: Diagnosis not present

## 2017-07-06 DIAGNOSIS — Z981 Arthrodesis status: Secondary | ICD-10-CM | POA: Diagnosis not present

## 2017-07-06 DIAGNOSIS — M4326 Fusion of spine, lumbar region: Secondary | ICD-10-CM | POA: Diagnosis not present

## 2017-07-06 DIAGNOSIS — M5137 Other intervertebral disc degeneration, lumbosacral region: Secondary | ICD-10-CM | POA: Diagnosis not present

## 2017-07-06 DIAGNOSIS — M5416 Radiculopathy, lumbar region: Secondary | ICD-10-CM | POA: Diagnosis not present

## 2017-07-06 DIAGNOSIS — F329 Major depressive disorder, single episode, unspecified: Secondary | ICD-10-CM | POA: Diagnosis not present

## 2017-07-06 DIAGNOSIS — Z6837 Body mass index (BMI) 37.0-37.9, adult: Secondary | ICD-10-CM | POA: Diagnosis not present

## 2017-07-06 DIAGNOSIS — M5136 Other intervertebral disc degeneration, lumbar region: Secondary | ICD-10-CM | POA: Diagnosis not present

## 2017-07-06 DIAGNOSIS — Z87891 Personal history of nicotine dependence: Secondary | ICD-10-CM | POA: Diagnosis not present

## 2017-07-06 HISTORY — PX: ANTERIOR FUSION LUMBAR SPINE: SUR629

## 2017-07-07 DIAGNOSIS — M4326 Fusion of spine, lumbar region: Secondary | ICD-10-CM | POA: Diagnosis not present

## 2017-07-11 MED ORDER — GABAPENTIN 800 MG PO TABS
800.00 mg | ORAL_TABLET | ORAL | Status: DC
Start: 2017-07-11 — End: 2017-07-11

## 2017-07-11 MED ORDER — VALACYCLOVIR HCL 500 MG PO TABS
500.00 mg | ORAL_TABLET | ORAL | Status: DC
Start: 2017-07-11 — End: 2017-07-11

## 2017-07-11 MED ORDER — METHOCARBAMOL 750 MG PO TABS
750.00 mg | ORAL_TABLET | ORAL | Status: DC
Start: 2017-07-11 — End: 2017-07-11

## 2017-07-11 MED ORDER — PROCHLORPERAZINE MALEATE 5 MG PO TABS
5.00 mg | ORAL_TABLET | ORAL | Status: DC
Start: ? — End: 2017-07-11

## 2017-07-11 MED ORDER — TAMSULOSIN HCL 0.4 MG PO CAPS
0.40 mg | ORAL_CAPSULE | ORAL | Status: DC
Start: 2017-07-12 — End: 2017-07-11

## 2017-07-11 MED ORDER — HYDROMORPHONE HCL 1 MG/ML IJ SOLN
0.40 mg | INTRAMUSCULAR | Status: DC
Start: ? — End: 2017-07-11

## 2017-07-11 MED ORDER — BENZOCAINE-MENTHOL 6-10 MG MT LOZG
1.00 | LOZENGE | OROMUCOSAL | Status: DC
Start: ? — End: 2017-07-11

## 2017-07-11 MED ORDER — PHENOL 1.4 % MT LIQD
1.00 | OROMUCOSAL | Status: DC
Start: ? — End: 2017-07-11

## 2017-07-11 MED ORDER — LEVONORGEST-ETH ESTRAD 91-DAY 0.15-0.03 MG PO TABS
1.00 | ORAL_TABLET | ORAL | Status: DC
Start: 2017-07-11 — End: 2017-07-11

## 2017-07-11 MED ORDER — OXYCODONE HCL 5 MG PO TABS
10.00 mg | ORAL_TABLET | ORAL | Status: DC
Start: ? — End: 2017-07-11

## 2017-07-11 MED ORDER — ONDANSETRON HCL 4 MG/2ML IJ SOLN
4.00 mg | INTRAMUSCULAR | Status: DC
Start: ? — End: 2017-07-11

## 2017-07-11 MED ORDER — GENERIC EXTERNAL MEDICATION
Status: DC
Start: ? — End: 2017-07-11

## 2017-07-11 MED ORDER — BUPROPION HCL 100 MG PO TABS
100.00 mg | ORAL_TABLET | ORAL | Status: DC
Start: 2017-07-12 — End: 2017-07-11

## 2017-07-11 MED ORDER — GENERIC EXTERNAL MEDICATION
200.00 mg | Status: DC
Start: ? — End: 2017-07-11

## 2017-07-11 MED ORDER — ACETAMINOPHEN 500 MG PO TABS
1000.00 mg | ORAL_TABLET | ORAL | Status: DC
Start: 2017-07-12 — End: 2017-07-11

## 2017-07-11 MED ORDER — VENLAFAXINE HCL ER 150 MG PO CP24
150.00 mg | ORAL_CAPSULE | ORAL | Status: DC
Start: 2017-07-11 — End: 2017-07-11

## 2017-07-14 DIAGNOSIS — Z888 Allergy status to other drugs, medicaments and biological substances status: Secondary | ICD-10-CM | POA: Diagnosis not present

## 2017-07-14 DIAGNOSIS — Z883 Allergy status to other anti-infective agents status: Secondary | ICD-10-CM | POA: Diagnosis not present

## 2017-07-14 DIAGNOSIS — F331 Major depressive disorder, recurrent, moderate: Secondary | ICD-10-CM | POA: Diagnosis not present

## 2017-07-14 DIAGNOSIS — R509 Fever, unspecified: Secondary | ICD-10-CM | POA: Diagnosis not present

## 2017-07-14 DIAGNOSIS — Z87891 Personal history of nicotine dependence: Secondary | ICD-10-CM | POA: Diagnosis not present

## 2017-07-14 DIAGNOSIS — Z981 Arthrodesis status: Secondary | ICD-10-CM | POA: Diagnosis not present

## 2017-07-14 DIAGNOSIS — F419 Anxiety disorder, unspecified: Secondary | ICD-10-CM | POA: Diagnosis not present

## 2017-07-14 DIAGNOSIS — E669 Obesity, unspecified: Secondary | ICD-10-CM | POA: Diagnosis not present

## 2017-07-14 DIAGNOSIS — R000 Tachycardia, unspecified: Secondary | ICD-10-CM | POA: Diagnosis not present

## 2017-07-14 DIAGNOSIS — Z4789 Encounter for other orthopedic aftercare: Secondary | ICD-10-CM | POA: Diagnosis not present

## 2017-07-14 DIAGNOSIS — Z915 Personal history of self-harm: Secondary | ICD-10-CM | POA: Diagnosis not present

## 2017-07-14 DIAGNOSIS — F411 Generalized anxiety disorder: Secondary | ICD-10-CM | POA: Diagnosis not present

## 2017-07-14 DIAGNOSIS — R109 Unspecified abdominal pain: Secondary | ICD-10-CM | POA: Diagnosis not present

## 2017-07-14 DIAGNOSIS — T8189XA Other complications of procedures, not elsewhere classified, initial encounter: Secondary | ICD-10-CM | POA: Diagnosis not present

## 2017-07-14 DIAGNOSIS — M545 Low back pain: Secondary | ICD-10-CM | POA: Diagnosis not present

## 2017-07-14 DIAGNOSIS — R188 Other ascites: Secondary | ICD-10-CM | POA: Diagnosis not present

## 2017-07-14 DIAGNOSIS — T8131XA Disruption of external operation (surgical) wound, not elsewhere classified, initial encounter: Secondary | ICD-10-CM | POA: Diagnosis not present

## 2017-07-14 DIAGNOSIS — X58XXXA Exposure to other specified factors, initial encounter: Secondary | ICD-10-CM | POA: Diagnosis not present

## 2017-07-14 DIAGNOSIS — M5136 Other intervertebral disc degeneration, lumbar region: Secondary | ICD-10-CM | POA: Diagnosis not present

## 2017-07-14 DIAGNOSIS — T8141XA Infection following a procedure, superficial incisional surgical site, initial encounter: Secondary | ICD-10-CM | POA: Diagnosis not present

## 2017-07-14 DIAGNOSIS — M62838 Other muscle spasm: Secondary | ICD-10-CM | POA: Diagnosis not present

## 2017-07-14 DIAGNOSIS — M5126 Other intervertebral disc displacement, lumbar region: Secondary | ICD-10-CM | POA: Diagnosis not present

## 2017-07-14 DIAGNOSIS — F418 Other specified anxiety disorders: Secondary | ICD-10-CM | POA: Diagnosis not present

## 2017-07-14 DIAGNOSIS — Z792 Long term (current) use of antibiotics: Secondary | ICD-10-CM | POA: Diagnosis not present

## 2017-07-14 DIAGNOSIS — R112 Nausea with vomiting, unspecified: Secondary | ICD-10-CM | POA: Diagnosis not present

## 2017-07-14 DIAGNOSIS — Z880 Allergy status to penicillin: Secondary | ICD-10-CM | POA: Diagnosis not present

## 2017-07-14 DIAGNOSIS — F329 Major depressive disorder, single episode, unspecified: Secondary | ICD-10-CM | POA: Diagnosis not present

## 2017-07-14 DIAGNOSIS — T8130XA Disruption of wound, unspecified, initial encounter: Secondary | ICD-10-CM | POA: Diagnosis not present

## 2017-07-14 DIAGNOSIS — R45851 Suicidal ideations: Secondary | ICD-10-CM | POA: Diagnosis not present

## 2017-07-14 DIAGNOSIS — Z882 Allergy status to sulfonamides status: Secondary | ICD-10-CM | POA: Diagnosis not present

## 2017-07-14 DIAGNOSIS — Z881 Allergy status to other antibiotic agents status: Secondary | ICD-10-CM | POA: Diagnosis not present

## 2017-07-14 DIAGNOSIS — R1030 Lower abdominal pain, unspecified: Secondary | ICD-10-CM | POA: Diagnosis not present

## 2017-07-14 DIAGNOSIS — M5416 Radiculopathy, lumbar region: Secondary | ICD-10-CM | POA: Diagnosis not present

## 2017-07-14 DIAGNOSIS — A419 Sepsis, unspecified organism: Secondary | ICD-10-CM | POA: Diagnosis not present

## 2017-07-14 DIAGNOSIS — R1084 Generalized abdominal pain: Secondary | ICD-10-CM | POA: Diagnosis not present

## 2017-07-14 DIAGNOSIS — Z9889 Other specified postprocedural states: Secondary | ICD-10-CM | POA: Diagnosis not present

## 2017-07-14 MED ORDER — SODIUM CHLORIDE 0.9 % IV SOLN
INTRAVENOUS | Status: DC
Start: ? — End: 2017-07-14

## 2017-07-14 MED ORDER — GENERIC EXTERNAL MEDICATION
Status: DC
Start: ? — End: 2017-07-14

## 2017-07-15 DIAGNOSIS — T8189XA Other complications of procedures, not elsewhere classified, initial encounter: Secondary | ICD-10-CM | POA: Diagnosis not present

## 2017-07-15 DIAGNOSIS — R188 Other ascites: Secondary | ICD-10-CM | POA: Diagnosis not present

## 2017-07-16 DIAGNOSIS — T8189XA Other complications of procedures, not elsewhere classified, initial encounter: Secondary | ICD-10-CM | POA: Diagnosis not present

## 2017-07-16 DIAGNOSIS — T8131XA Disruption of external operation (surgical) wound, not elsewhere classified, initial encounter: Secondary | ICD-10-CM | POA: Diagnosis not present

## 2017-07-17 DIAGNOSIS — T8189XA Other complications of procedures, not elsewhere classified, initial encounter: Secondary | ICD-10-CM | POA: Diagnosis not present

## 2017-07-18 DIAGNOSIS — R45851 Suicidal ideations: Secondary | ICD-10-CM | POA: Diagnosis not present

## 2017-07-18 DIAGNOSIS — F411 Generalized anxiety disorder: Secondary | ICD-10-CM | POA: Diagnosis not present

## 2017-07-18 DIAGNOSIS — T8189XA Other complications of procedures, not elsewhere classified, initial encounter: Secondary | ICD-10-CM | POA: Diagnosis not present

## 2017-07-18 DIAGNOSIS — F331 Major depressive disorder, recurrent, moderate: Secondary | ICD-10-CM | POA: Diagnosis not present

## 2017-07-19 DIAGNOSIS — T8189XA Other complications of procedures, not elsewhere classified, initial encounter: Secondary | ICD-10-CM | POA: Diagnosis not present

## 2017-07-19 MED ORDER — PROCHLORPERAZINE EDISYLATE 5 MG/ML IJ SOLN
5.00 mg | INTRAMUSCULAR | Status: DC
Start: ? — End: 2017-07-19

## 2017-07-19 MED ORDER — LORAZEPAM 0.5 MG PO TABS
.50 mg | ORAL_TABLET | ORAL | Status: DC
Start: ? — End: 2017-07-19

## 2017-07-19 MED ORDER — SODIUM CHLORIDE 0.9 % IJ SOLN
10.00 mL | INTRAMUSCULAR | Status: DC
Start: ? — End: 2017-07-19

## 2017-07-19 MED ORDER — HYDROXYZINE PAMOATE 25 MG PO CAPS
25.00 mg | ORAL_CAPSULE | ORAL | Status: DC
Start: ? — End: 2017-07-19

## 2017-07-19 MED ORDER — SORBITOL 70 % RE SOLN
30.00 mL | RECTAL | Status: DC
Start: ? — End: 2017-07-19

## 2017-07-19 MED ORDER — POLYETHYLENE GLYCOL 3350 17 G PO PACK
17.00 g | PACK | ORAL | Status: DC
Start: 2017-07-20 — End: 2017-07-19

## 2017-07-19 MED ORDER — PROCHLORPERAZINE MALEATE 5 MG PO TABS
5.00 mg | ORAL_TABLET | ORAL | Status: DC
Start: ? — End: 2017-07-19

## 2017-07-19 MED ORDER — LEVONORGEST-ETH ESTRAD 91-DAY 0.15-0.03 MG PO TABS
1.00 | ORAL_TABLET | ORAL | Status: DC
Start: 2017-07-19 — End: 2017-07-19

## 2017-07-19 MED ORDER — OXYCODONE HCL 5 MG PO TABS
10.00 mg | ORAL_TABLET | ORAL | Status: DC
Start: ? — End: 2017-07-19

## 2017-07-19 MED ORDER — TRAMADOL HCL 50 MG PO TABS
50.00 mg | ORAL_TABLET | ORAL | Status: DC
Start: ? — End: 2017-07-19

## 2017-07-19 MED ORDER — ACETAMINOPHEN 500 MG PO TABS
1000.00 mg | ORAL_TABLET | ORAL | Status: DC
Start: ? — End: 2017-07-19

## 2017-07-19 MED ORDER — GENERIC EXTERNAL MEDICATION
800.00 mg | Status: DC
Start: 2017-07-19 — End: 2017-07-19

## 2017-07-19 MED ORDER — SODIUM CHLORIDE 0.9 % IJ SOLN
10.00 mL | INTRAMUSCULAR | Status: DC
Start: 2017-07-19 — End: 2017-07-19

## 2017-07-19 MED ORDER — GENERIC EXTERNAL MEDICATION
225.00 mg | Status: DC
Start: 2017-07-20 — End: 2017-07-19

## 2017-07-19 MED ORDER — SENNOSIDES-DOCUSATE SODIUM 8.6-50 MG PO TABS
2.00 | ORAL_TABLET | ORAL | Status: DC
Start: 2017-07-19 — End: 2017-07-19

## 2017-07-19 MED ORDER — METHOCARBAMOL 750 MG PO TABS
750.00 mg | ORAL_TABLET | ORAL | Status: DC
Start: 2017-07-19 — End: 2017-07-19

## 2017-07-19 MED ORDER — DIPHENHYDRAMINE HCL 50 MG/ML IJ SOLN
12.50 mg | INTRAMUSCULAR | Status: DC
Start: ? — End: 2017-07-19

## 2017-07-19 MED ORDER — CYCLOBENZAPRINE HCL 5 MG PO TABS
5.00 mg | ORAL_TABLET | ORAL | Status: DC
Start: 2017-07-19 — End: 2017-07-19

## 2017-07-19 MED ORDER — HEPARIN SODIUM (PORCINE) 5000 UNIT/ML IJ SOLN
5000.00 | INTRAMUSCULAR | Status: DC
Start: 2017-07-19 — End: 2017-07-19

## 2017-07-19 MED ORDER — CIPROFLOXACIN HCL 750 MG PO TABS
750.00 mg | ORAL_TABLET | ORAL | Status: DC
Start: 2017-07-19 — End: 2017-07-19

## 2017-07-20 ENCOUNTER — Telehealth: Payer: Self-pay | Admitting: Family

## 2017-07-20 DIAGNOSIS — T8189XA Other complications of procedures, not elsewhere classified, initial encounter: Secondary | ICD-10-CM | POA: Diagnosis not present

## 2017-07-20 NOTE — Telephone Encounter (Signed)
Please schedule pt for 2 week hospital follow up with me.

## 2017-07-21 DIAGNOSIS — T8189XA Other complications of procedures, not elsewhere classified, initial encounter: Secondary | ICD-10-CM | POA: Diagnosis not present

## 2017-07-21 NOTE — Telephone Encounter (Signed)
LVM for pt to return call to schedule apt.

## 2017-07-22 DIAGNOSIS — F419 Anxiety disorder, unspecified: Secondary | ICD-10-CM | POA: Diagnosis not present

## 2017-07-22 DIAGNOSIS — F329 Major depressive disorder, single episode, unspecified: Secondary | ICD-10-CM | POA: Diagnosis not present

## 2017-07-22 DIAGNOSIS — M5416 Radiculopathy, lumbar region: Secondary | ICD-10-CM | POA: Diagnosis not present

## 2017-07-22 DIAGNOSIS — Z792 Long term (current) use of antibiotics: Secondary | ICD-10-CM | POA: Diagnosis not present

## 2017-07-22 DIAGNOSIS — Z4789 Encounter for other orthopedic aftercare: Secondary | ICD-10-CM | POA: Diagnosis not present

## 2017-07-22 DIAGNOSIS — T8189XA Other complications of procedures, not elsewhere classified, initial encounter: Secondary | ICD-10-CM | POA: Diagnosis not present

## 2017-07-23 DIAGNOSIS — T8189XA Other complications of procedures, not elsewhere classified, initial encounter: Secondary | ICD-10-CM | POA: Diagnosis not present

## 2017-07-24 DIAGNOSIS — T8189XA Other complications of procedures, not elsewhere classified, initial encounter: Secondary | ICD-10-CM | POA: Diagnosis not present

## 2017-07-25 DIAGNOSIS — F329 Major depressive disorder, single episode, unspecified: Secondary | ICD-10-CM | POA: Diagnosis not present

## 2017-07-25 DIAGNOSIS — F419 Anxiety disorder, unspecified: Secondary | ICD-10-CM | POA: Diagnosis not present

## 2017-07-25 DIAGNOSIS — M5416 Radiculopathy, lumbar region: Secondary | ICD-10-CM | POA: Diagnosis not present

## 2017-07-25 DIAGNOSIS — T8189XA Other complications of procedures, not elsewhere classified, initial encounter: Secondary | ICD-10-CM | POA: Diagnosis not present

## 2017-07-25 DIAGNOSIS — Z4789 Encounter for other orthopedic aftercare: Secondary | ICD-10-CM | POA: Diagnosis not present

## 2017-07-25 DIAGNOSIS — Z792 Long term (current) use of antibiotics: Secondary | ICD-10-CM | POA: Diagnosis not present

## 2017-07-26 DIAGNOSIS — T8189XA Other complications of procedures, not elsewhere classified, initial encounter: Secondary | ICD-10-CM | POA: Diagnosis not present

## 2017-07-27 DIAGNOSIS — M5416 Radiculopathy, lumbar region: Secondary | ICD-10-CM | POA: Diagnosis not present

## 2017-07-27 DIAGNOSIS — F329 Major depressive disorder, single episode, unspecified: Secondary | ICD-10-CM | POA: Diagnosis not present

## 2017-07-27 DIAGNOSIS — F419 Anxiety disorder, unspecified: Secondary | ICD-10-CM | POA: Diagnosis not present

## 2017-07-27 DIAGNOSIS — Z4789 Encounter for other orthopedic aftercare: Secondary | ICD-10-CM | POA: Diagnosis not present

## 2017-07-27 DIAGNOSIS — T8189XA Other complications of procedures, not elsewhere classified, initial encounter: Secondary | ICD-10-CM | POA: Diagnosis not present

## 2017-07-27 DIAGNOSIS — Z792 Long term (current) use of antibiotics: Secondary | ICD-10-CM | POA: Diagnosis not present

## 2017-07-28 DIAGNOSIS — T8189XA Other complications of procedures, not elsewhere classified, initial encounter: Secondary | ICD-10-CM | POA: Diagnosis not present

## 2017-07-29 ENCOUNTER — Other Ambulatory Visit: Payer: Self-pay

## 2017-07-29 ENCOUNTER — Emergency Department (HOSPITAL_BASED_OUTPATIENT_CLINIC_OR_DEPARTMENT_OTHER)
Admission: EM | Admit: 2017-07-29 | Discharge: 2017-07-29 | Disposition: A | Discharge status: Home / Self Care | Payer: BLUE CROSS/BLUE SHIELD | Attending: Emergency Medicine | Admitting: Emergency Medicine

## 2017-07-29 ENCOUNTER — Encounter (HOSPITAL_BASED_OUTPATIENT_CLINIC_OR_DEPARTMENT_OTHER): Payer: Self-pay | Admitting: *Deleted

## 2017-07-29 ENCOUNTER — Other Ambulatory Visit: Payer: Self-pay | Admitting: Family

## 2017-07-29 DIAGNOSIS — F419 Anxiety disorder, unspecified: Secondary | ICD-10-CM | POA: Diagnosis not present

## 2017-07-29 DIAGNOSIS — Z87891 Personal history of nicotine dependence: Secondary | ICD-10-CM | POA: Diagnosis not present

## 2017-07-29 DIAGNOSIS — R51 Headache: Secondary | ICD-10-CM | POA: Insufficient documentation

## 2017-07-29 DIAGNOSIS — M5416 Radiculopathy, lumbar region: Secondary | ICD-10-CM | POA: Diagnosis not present

## 2017-07-29 DIAGNOSIS — Z792 Long term (current) use of antibiotics: Secondary | ICD-10-CM | POA: Diagnosis not present

## 2017-07-29 DIAGNOSIS — G43909 Migraine, unspecified, not intractable, without status migrainosus: Secondary | ICD-10-CM | POA: Diagnosis not present

## 2017-07-29 DIAGNOSIS — Z79899 Other long term (current) drug therapy: Secondary | ICD-10-CM | POA: Insufficient documentation

## 2017-07-29 DIAGNOSIS — R519 Headache, unspecified: Secondary | ICD-10-CM

## 2017-07-29 DIAGNOSIS — F329 Major depressive disorder, single episode, unspecified: Secondary | ICD-10-CM | POA: Diagnosis not present

## 2017-07-29 DIAGNOSIS — Z4789 Encounter for other orthopedic aftercare: Secondary | ICD-10-CM | POA: Diagnosis not present

## 2017-07-29 DIAGNOSIS — T8189XA Other complications of procedures, not elsewhere classified, initial encounter: Secondary | ICD-10-CM | POA: Diagnosis not present

## 2017-07-29 MED ORDER — SODIUM CHLORIDE 0.9 % IV BOLUS (SEPSIS)
1000.0000 mL | Freq: Once | INTRAVENOUS | Status: AC
  Administered 2017-07-29: 1000 mL via INTRAVENOUS

## 2017-07-29 MED ORDER — DIPHENHYDRAMINE HCL 25 MG PO CAPS
50.0000 mg | ORAL_CAPSULE | Freq: Once | ORAL | Status: AC
  Administered 2017-07-29: 50 mg via ORAL
  Filled 2017-07-29: qty 2

## 2017-07-29 MED ORDER — KETOROLAC TROMETHAMINE 30 MG/ML IJ SOLN
30.0000 mg | Freq: Once | INTRAMUSCULAR | Status: AC
  Administered 2017-07-29: 30 mg via INTRAVENOUS
  Filled 2017-07-29: qty 1

## 2017-07-29 MED ORDER — METOCLOPRAMIDE HCL 5 MG/ML IJ SOLN
10.0000 mg | Freq: Once | INTRAMUSCULAR | Status: AC
  Administered 2017-07-29: 10 mg via INTRAVENOUS
  Filled 2017-07-29: qty 2

## 2017-07-29 MED ORDER — ONDANSETRON HCL 4 MG/2ML IJ SOLN
4.0000 mg | Freq: Once | INTRAMUSCULAR | Status: AC
  Administered 2017-07-29: 4 mg via INTRAVENOUS
  Filled 2017-07-29: qty 2

## 2017-07-29 MED ORDER — DIPHENHYDRAMINE HCL 50 MG/ML IJ SOLN: 25.0000 mg | Freq: Once | INTRAMUSCULAR | Status: DC

## 2017-07-29 MED ORDER — DEXAMETHASONE SODIUM PHOSPHATE 10 MG/ML IJ SOLN
10.0000 mg | Freq: Once | INTRAMUSCULAR | Status: AC
  Administered 2017-07-29: 10 mg via INTRAVENOUS
  Filled 2017-07-29: qty 1

## 2017-07-29 NOTE — ED Notes (Signed)
Pt began vomiting during decadron administration.  Will obtain order for another antiemetic

## 2017-07-29 NOTE — ED Provider Notes (Signed)
[facility] EMERGENCY DEPARTMENT Provider Note   CSN: [id number] Arrival date & time: 07/29/17  0047     History   Chief Complaint Chief Complaint  Patient presents with  . Migraine    HPI Gina James is a 36 y.o. female.  Patient is a 36 year old female with history of migraines, chronic back pain, arthritis, anxiety presenting for evaluation of headache.  This started 2 hours ago and is worsening.  Her headache is frontal in nature and associated with nausea and vomiting.  She reports blurry vision.  This is typical symptomatology for her migraines.   The history is provided by the patient.  Migraine  This is a recurrent problem. The current episode started 1 to 2 hours ago. The problem occurs constantly. The problem has been rapidly worsening. Associated symptoms include headaches. Nothing aggravates the symptoms. Nothing relieves the symptoms. She has tried nothing for the symptoms. The treatment provided no relief.    Past Medical History:  Diagnosis Date  . Ankle fracture, right   . Anxiety   . Arthritis    "in my back" (04/08/2015)  . Chronic lower back pain   . Complication of anesthesia    difficulty urinating after anesthesia  . Headache    "monthly" (04/08/2015)  . Post partum depression     Patient Active Problem List   Diagnosis Date Noted  . Spasm of back muscles 11/03/2016  . Depression with anxiety 08/04/2016  . 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 PMDD (premenstrual dysphoric disorder) 07/29/2015  . Pilon fracture 04/08/2015  . Skin lesion 11/19/2014  . Preventative health care 10/29/2014  . Obesity 06/10/2014  . Chronic meniscal tear of knee 11/23/2013  . Lumbar pain with radiation down both legs 09/11/2013  . Generalized anxiety disorder 06/18/2013    Past Surgical History:  Procedure Laterality Date  . BREAST SURGERY    . CESAREAN SECTION  2007 and 2009  . COMBINED ABDOMINOPLASTY AND LIPOSUCTION  12/2014   "liposuction in thighs"  . FRACTURE SURGERY    . LAPAROSCOPIC ABDOMINAL EXPLORATION  1997   "to check for endometriosis"  . LAPAROSCOPIC CHOLECYSTECTOMY  2007  . OPEN REDUCTION INTERNAL FIXATION (ORIF) TIBIA/FIBULA FRACTURE Right 04/08/2015   Procedure: OPEN REDUCTION INTERNAL FIXATION (ORIF)  RIGHT TIBIA PILON FRACTURE;  Surgeon: Wylene Simmer, MD;  Location: [facility];  Service: Orthopedics;  Laterality: Right;  . TIBIA FRACTURE SURGERY Right 04/08/2015   ORIF tibial pilon    OB History    No data available       Home Medications    Prior to Admission medications   Medication Sig Start Date End Date Taking? Authorizing Provider  ciprofloxacin (CIPRO) 500 MG tablet Take 500 mg by mouth 2 (two) times daily.   Yes [provider]  oxymorphone (OPANA ER) 10 MG 12 hr tablet Take 10 mg by mouth every 12 (twelve) hours.   Yes [provider]  buPROPion (WELLBUTRIN XL) 150 MG 24 hr tablet Take 1 tablet (150 mg total) by mouth daily. 06/27/17   Debbrah Alar, NP  gabapentin (NEURONTIN) 800 MG tablet Take 800 mg by mouth 3 (three) times daily. 01/28/17   [provider]  JOLESSA 0.15-0.03 MG tablet TAKE ONE TABLET BY MOUTH ONCE DAILY 04/26/17   Debbrah Alar, NP  L-FORMULA LYSINE HCL PO Take 1 tablet by mouth daily.    [provider]  methocarbamol (ROBAXIN) 750 MG tablet Take 1 tablet (750 mg total) by mouth 2 (  two) times daily. 05/30/17   Debbrah Alar, NP  Multiple Vitamins-Minerals (MULTIVITAMIN WITH MINERALS) tablet Take 1 tablet by mouth daily.    [provider]  OVER THE COUNTER MEDICATION [place].  Take 2 capsules daily.    [provider]  pyridOXINE (VITAMIN B-6) 100 MG tablet Take 1,000 mg by mouth daily.    [provider]  valACYclovir (VALTREX) 500 MG tablet TAKE ONE TABLET BY MOUTH ONCE DAILY 04/26/17   Debbrah Alar, NP  venlafaxine XR (EFFEXOR-XR) 150 MG 24 hr capsule Take 1 capsule (150 mg total) by  mouth daily. 06/27/17   Debbrah Alar, NP    Family History Family History  Problem Relation Age of Onset  . Diabetes Father   . Cancer Sister 30       breast, had chemo/double mastectomy  . Heart disease Maternal Grandfather   . Heart disease Paternal Grandfather     Social History Social History   Tobacco Use  . Smoking status: Former Smoker    Packs/day: 0.12    Years: 10.00    Pack years: 1.20    Types: Cigarettes    Last attempt to quit: 09/06/2004    Years since quitting: 12.9  . Smokeless tobacco: Never Used  Substance Use Topics  . Alcohol use: Yes    Alcohol/week: 2.4 oz    Types: 4 Cans of beer per week    Comment: 04/08/2015 "drink only the weekends"  . Drug use: No     Allergies   Lexapro [escitalopram]; Amoxicillin; Ceclor [cefaclor]; Erythromycin base; Penicillins; Phenergan [promethazine hcl]; Sulfa antibiotics; and Zithromax [azithromycin]   Review of Systems Review of Systems  Neurological: Positive for headaches.  All other systems reviewed and are negative.    Physical Exam Updated Vital Signs BP 136/78 (BP Location: Left Arm)   Pulse 94   Temp 98.3 F (36.8 C) (Oral)   Resp (!) 22   LMP 07/11/2017 (Approximate)   SpO2 100%   Physical Exam  Constitutional: She is oriented to person, place, and time. She appears well-developed and well-nourished. No distress.  HENT:  Head: Normocephalic and atraumatic.  Mouth/Throat: Oropharynx is clear and moist.  Eyes: EOM are normal. Pupils are equal, round, and reactive to light.  Neck: Normal range of motion. Neck supple.  Cardiovascular: Normal rate and regular rhythm. Exam reveals no gallop and no friction rub.  No murmur heard. Pulmonary/Chest: Effort normal and breath sounds normal. No respiratory distress. She has no wheezes.  Abdominal: Soft. Bowel sounds are normal. She exhibits no distension. There is no tenderness.  Musculoskeletal: Normal range of motion. She exhibits no edema.    Neurological: She is alert and oriented to person, place, and time. No cranial nerve deficit. She exhibits normal muscle tone. Coordination normal.  Skin: Skin is warm and dry. She is not diaphoretic.  Nursing note and vitals reviewed.    ED Treatments / Results  Labs (all labs ordered are listed, but only abnormal results are displayed) Labs Reviewed - No data to display  EKG  EKG Interpretation None       Radiology No results found.  Procedures Procedures (including critical care time)  Medications Ordered in ED Medications  sodium chloride 0.9 % bolus 1,000 mL (not administered)  ketorolac (TORADOL) 30 MG/ML injection 30 mg (not administered)  diphenhydrAMINE (BENADRYL) injection 25 mg (not administered)  dexamethasone (DECADRON) injection 10 mg (not administered)  metoCLOPramide (REGLAN) injection 10 mg (not administered)     Initial Impression /  Assessment and Plan / ED Course  I have reviewed the triage vital signs and the nursing notes.  Pertinent labs & imaging results that were available during my care of the patient were reviewed by me and considered in my medical decision making (see chart for details).  Patient presents with headache.  She has a history of migraines.  She is neurologically intact and symptoms have significantly improved with a migraine cocktail.  She will be discharged, to follow-up with her neurologist as needed.  Final Clinical Impressions(s) / ED Diagnoses   Final diagnoses:  None    ED Discharge Orders    None       Veryl Speak, MD 07/29/17 [phone]

## 2017-07-29 NOTE — ED Triage Notes (Addendum)
C/o nausea that started around last night around 6pm. States she has had vomiting 4 times last night. Fevers unknown. C/o frontal h/a. States pain is constant. C/o light sensitivity. Hx of migraines. Pt is hyperventilating on arrival. Encouraged to slow breathing. Pt currently has a wound vac from an infection post back surgery.  Last dose of oxycodone was at 2200

## 2017-07-29 NOTE — ED Notes (Signed)
Pt verbalizes understanding of d/c instructions and denies any further needs at this time. 

## 2017-07-29 NOTE — Discharge Instructions (Signed)
Continue your medications as before.  Follow-up with your neurologist as needed if headaches persist.

## 2017-07-30 DIAGNOSIS — T8189XA Other complications of procedures, not elsewhere classified, initial encounter: Secondary | ICD-10-CM | POA: Diagnosis not present

## 2017-07-31 DIAGNOSIS — T8189XA Other complications of procedures, not elsewhere classified, initial encounter: Secondary | ICD-10-CM | POA: Diagnosis not present

## 2017-08-01 DIAGNOSIS — T8189XA Other complications of procedures, not elsewhere classified, initial encounter: Secondary | ICD-10-CM | POA: Diagnosis not present

## 2017-08-01 DIAGNOSIS — F419 Anxiety disorder, unspecified: Secondary | ICD-10-CM | POA: Diagnosis not present

## 2017-08-01 DIAGNOSIS — Z4789 Encounter for other orthopedic aftercare: Secondary | ICD-10-CM | POA: Diagnosis not present

## 2017-08-01 DIAGNOSIS — M5416 Radiculopathy, lumbar region: Secondary | ICD-10-CM | POA: Diagnosis not present

## 2017-08-01 DIAGNOSIS — Z792 Long term (current) use of antibiotics: Secondary | ICD-10-CM | POA: Diagnosis not present

## 2017-08-01 DIAGNOSIS — F329 Major depressive disorder, single episode, unspecified: Secondary | ICD-10-CM | POA: Diagnosis not present

## 2017-08-02 DIAGNOSIS — F419 Anxiety disorder, unspecified: Secondary | ICD-10-CM | POA: Diagnosis not present

## 2017-08-02 DIAGNOSIS — T8189XA Other complications of procedures, not elsewhere classified, initial encounter: Secondary | ICD-10-CM | POA: Diagnosis not present

## 2017-08-02 DIAGNOSIS — F329 Major depressive disorder, single episode, unspecified: Secondary | ICD-10-CM | POA: Diagnosis not present

## 2017-08-02 DIAGNOSIS — Z4789 Encounter for other orthopedic aftercare: Secondary | ICD-10-CM | POA: Diagnosis not present

## 2017-08-02 DIAGNOSIS — M5416 Radiculopathy, lumbar region: Secondary | ICD-10-CM | POA: Diagnosis not present

## 2017-08-02 DIAGNOSIS — Z792 Long term (current) use of antibiotics: Secondary | ICD-10-CM | POA: Diagnosis not present

## 2017-08-03 DIAGNOSIS — T8189XA Other complications of procedures, not elsewhere classified, initial encounter: Secondary | ICD-10-CM | POA: Diagnosis not present

## 2017-08-04 DIAGNOSIS — Z87891 Personal history of nicotine dependence: Secondary | ICD-10-CM | POA: Diagnosis not present

## 2017-08-04 DIAGNOSIS — G43009 Migraine without aura, not intractable, without status migrainosus: Secondary | ICD-10-CM | POA: Diagnosis not present

## 2017-08-04 DIAGNOSIS — Z79899 Other long term (current) drug therapy: Secondary | ICD-10-CM | POA: Insufficient documentation

## 2017-08-04 DIAGNOSIS — G8929 Other chronic pain: Secondary | ICD-10-CM | POA: Diagnosis not present

## 2017-08-04 DIAGNOSIS — R51 Headache: Secondary | ICD-10-CM | POA: Diagnosis not present

## 2017-08-04 DIAGNOSIS — T8189XA Other complications of procedures, not elsewhere classified, initial encounter: Secondary | ICD-10-CM | POA: Diagnosis not present

## 2017-08-05 ENCOUNTER — Emergency Department (HOSPITAL_BASED_OUTPATIENT_CLINIC_OR_DEPARTMENT_OTHER)
Admission: EM | Admit: 2017-08-05 | Discharge: 2017-08-05 | Disposition: A | Discharge status: Home / Self Care | Payer: BLUE CROSS/BLUE SHIELD | Attending: Emergency Medicine | Admitting: Emergency Medicine

## 2017-08-05 ENCOUNTER — Encounter: Payer: Self-pay | Admitting: Neurology

## 2017-08-05 ENCOUNTER — Encounter (HOSPITAL_BASED_OUTPATIENT_CLINIC_OR_DEPARTMENT_OTHER): Payer: Self-pay

## 2017-08-05 DIAGNOSIS — G43009 Migraine without aura, not intractable, without status migrainosus: Secondary | ICD-10-CM

## 2017-08-05 DIAGNOSIS — T8189XA Other complications of procedures, not elsewhere classified, initial encounter: Secondary | ICD-10-CM | POA: Diagnosis not present

## 2017-08-05 MED ORDER — SODIUM CHLORIDE 0.9 % IV BOLUS (SEPSIS)
1000.0000 mL | Freq: Once | INTRAVENOUS | Status: AC
  Administered 2017-08-05: 1000 mL via INTRAVENOUS

## 2017-08-05 MED ORDER — METOCLOPRAMIDE HCL 5 MG/ML IJ SOLN
10.0000 mg | Freq: Once | INTRAMUSCULAR | Status: AC
  Administered 2017-08-05: 10 mg via INTRAVENOUS
  Filled 2017-08-05: qty 2

## 2017-08-05 MED ORDER — DIPHENHYDRAMINE HCL 50 MG/ML IJ SOLN
25.0000 mg | Freq: Once | INTRAMUSCULAR | Status: AC
  Administered 2017-08-05: 25 mg via INTRAVENOUS
  Filled 2017-08-05: qty 1

## 2017-08-05 MED ORDER — KETOROLAC TROMETHAMINE 30 MG/ML IJ SOLN
30.0000 mg | Freq: Once | INTRAMUSCULAR | Status: AC
  Administered 2017-08-05: 30 mg via INTRAVENOUS
  Filled 2017-08-05: qty 1

## 2017-08-05 NOTE — Addendum Note (Signed)
Addended by: Debbrah Alar on: 08/05/2017 04:53 PM   Modules accepted: Orders

## 2017-08-05 NOTE — ED Triage Notes (Signed)
Pt c/o the beginning of a migraine headache, states took 3 alieves and 2 benadryls 59mins ago with no relief

## 2017-08-05 NOTE — ED Provider Notes (Signed)
TIME SEEN: 12:15 AM  CHIEF COMPLAINT: Migraine headache  HPI: Patient is a 36 year old female with history of chronic lower back pain status post recent lumbar surgery by Dr. Prince Rome at [facility] on October 31, migraine headaches who presents to the emergency department today with a migraine headache that started earlier tonight.  Tried over-the-counter medications at home without relief.  States that the headache is diffuse, throbbing with associated photophobia and nausea which is typical of her previous migraines.  No fevers, neck pain or neck stiffness.  No numbness, tingling or focal weakness.  No bowel or bladder incontinence.  No head injury.  Not on blood thinners.  ROS: See HPI Constitutional: no fever  Eyes: no drainage  ENT: no runny nose   Cardiovascular:  no chest pain  Resp: no SOB  GI: no vomiting GU: no dysuria Integumentary: no rash  Allergy: no hives  Musculoskeletal: no leg swelling  Neurological: no slurred speech ROS otherwise negative  PAST MEDICAL HISTORY/PAST SURGICAL HISTORY:  Past Medical History:  Diagnosis Date  . Ankle fracture, right   . Anxiety   . Arthritis    "in my back" (04/08/2015)  . Chronic lower back pain   . Complication of anesthesia    difficulty urinating after anesthesia  . Headache    "monthly" (04/08/2015)  . Post partum depression     MEDICATIONS:  Prior to Admission medications   Medication Sig Start Date End Date Taking? Authorizing Provider  HYDROcodone-acetaminophen (NORCO/VICODIN) 5-325 MG tablet Take 1 tablet by mouth every 6 (six) hours as needed for moderate pain.   Yes [provider]  buPROPion (WELLBUTRIN XL) 150 MG 24 hr tablet Take 1 tablet (150 mg total) by mouth daily. 06/27/17   Debbrah Alar, NP  ciprofloxacin (CIPRO) 500 MG tablet Take 500 mg by mouth 2 (two) times daily.    [provider]  gabapentin (NEURONTIN) 800 MG tablet Take 800 mg by mouth 3 (three) times daily. 01/28/17   [provider]  JOLESSA 0.15-0.03 MG tablet TAKE ONE TABLET BY MOUTH ONCE DAILY 04/26/17   Debbrah Alar, NP  L-FORMULA LYSINE HCL PO Take 1 tablet by mouth daily.    [provider]  methocarbamol (ROBAXIN) 750 MG tablet Take 1 tablet (750 mg total) by mouth 2 (two) times daily. 05/30/17   Debbrah Alar, NP  Multiple Vitamins-Minerals (MULTIVITAMIN WITH MINERALS) tablet Take 1 tablet by mouth daily.    [provider]  OVER THE COUNTER MEDICATION [place].  Take 2 capsules daily.    [provider]  pyridOXINE (VITAMIN B-6) 100 MG tablet Take 1,000 mg by mouth daily.    [provider]  valACYclovir (VALTREX) 500 MG tablet TAKE ONE TABLET BY MOUTH ONCE DAILY 04/26/17   Debbrah Alar, NP  venlafaxine XR (EFFEXOR-XR) 150 MG 24 hr capsule Take 1 capsule (150 mg total) by mouth daily. 06/27/17   Debbrah Alar, NP  venlafaxine XR (EFFEXOR-XR) 150 MG 24 hr capsule TAKE 1 CAPSULE BY MOUTH ONCE DAILY 07/31/17   Debbrah Alar, NP    ALLERGIES:  Allergies  Allergen Reactions  . Lexapro [Escitalopram] Nausea And Vomiting  . Amoxicillin Hives  . Ceclor [Cefaclor] Hives  . Erythromycin Base Rash  . Penicillins Hives  . Phenergan [Promethazine Hcl] Hives  . Sulfa Antibiotics Hives  . Zithromax [Azithromycin] Hives    SOCIAL HISTORY:  Social History   Tobacco Use  . Smoking status: Former Smoker    Packs/day: 0.12    Years:  10.00    Pack years: 1.20    Types: Cigarettes    Last attempt to quit: 09/06/2004    Years since quitting: 12.9  . Smokeless tobacco: Never Used  Substance Use Topics  . Alcohol use: Yes    Alcohol/week: 2.4 oz    Types: 4 Cans of beer per week    Comment: 04/08/2015 "drink only the weekends"    FAMILY HISTORY: Family History  Problem Relation Age of Onset  . Diabetes Father   . Cancer Sister 30       breast, had chemo/double mastectomy  . Heart disease Maternal Grandfather   . Heart disease Paternal  Grandfather     EXAM: BP (!) 127/91 (BP Location: Left Arm)   Pulse 74   Temp 98.1 F (36.7 C) (Oral)   Resp 16   Ht 5\' 4"  (1.626 m)   Wt 95.3 kg (210 lb)   LMP 07/11/2017 (Approximate)   SpO2 100%   BMI 36.05 kg/m  CONSTITUTIONAL: Alert and oriented and responds appropriately to questions.  Appears uncomfortable, tearful, afebrile, nontoxic HEAD: Normocephalic EYES: Conjunctivae clear, pupils appear equal, EOMI, positive for photophobia ENT: normal nose; moist mucous membranes NECK: Supple, no meningismus, no nuchal rigidity, no LAD  CARD: RRR; S1 and S2 appreciated; no murmurs, no clicks, no rubs, no gallops RESP: Normal chest excursion without splinting or tachypnea; breath sounds clear and equal bilaterally; no wheezes, no rhonchi, no rales, no hypoxia or respiratory distress, speaking full sentences ABD/GI: Normal bowel sounds; non-distended; soft, non-tender, no rebound, no guarding, no peritoneal signs, no hepatosplenomegaly BACK:  The back appears normal and is non-tender to palpation, there is no CVA tenderness EXT: Normal ROM in all joints; non-tender to palpation; no edema; normal capillary refill; no cyanosis, no calf tenderness or swelling    SKIN: Normal color for age and race; warm; no rash NEURO: Moves all extremities equally, no saddle anesthesia, sensation to light touch intact diffusely, strength 5/5 in all 4 extremities, cranial nerves II through XII intact, normal speech PSYCH: The patient's mood and manner are appropriate. Grooming and personal hygiene are appropriate.  MEDICAL DECISION MAKING: Patient here with her typical migraine headache.  Has been treated previously with migraine cocktail.  Will give IV fluids, Toradol, Reglan, Benadryl.  No focal neurologic deficits.  No meningismus on exam.  No fever.  Doubt meningitis, encephalitis, intracranial hemorrhage, cavernous sinus thrombosis, stroke.  I do not feel she needs emergent imaging.  We will treat  symptomatically and reassess.  ED PROGRESS: 1:55 AM  Pt reports her headache is on is completely gone and she feels ready for discharge.  She declines any further medication.  She is now smiling and her photophobia has resolved.  Will give outpatient neurology follow-up given recurrent migraines.  She has a PCP for follow-up as well.  Discussed return precautions with patient and husband.  Husband will drive her home.   At this time, I do not feel there is any life-threatening condition present. I have reviewed and discussed all results (EKG, imaging, lab, urine as appropriate) and exam findings with patient/family. I have reviewed nursing notes and appropriate previous records.  I feel the patient is safe to be discharged home without further emergent workup and can continue workup as an outpatient as needed. Discussed usual and customary return precautions. Patient/family verbalize understanding and are comfortable with this plan.  Outpatient follow-up has been provided if needed. All questions have been answered.      Gina James,  Delice Bison, DO 08/05/17 [phone]

## 2017-08-06 DIAGNOSIS — T8189XA Other complications of procedures, not elsewhere classified, initial encounter: Secondary | ICD-10-CM | POA: Diagnosis not present

## 2017-08-07 DIAGNOSIS — T8189XA Other complications of procedures, not elsewhere classified, initial encounter: Secondary | ICD-10-CM | POA: Diagnosis not present

## 2017-08-08 DIAGNOSIS — T8189XA Other complications of procedures, not elsewhere classified, initial encounter: Secondary | ICD-10-CM | POA: Diagnosis not present

## 2017-08-09 ENCOUNTER — Encounter: Payer: Self-pay | Admitting: Neurology

## 2017-08-09 ENCOUNTER — Ambulatory Visit (INDEPENDENT_AMBULATORY_CARE_PROVIDER_SITE_OTHER): Payer: BLUE CROSS/BLUE SHIELD | Admitting: Neurology

## 2017-08-09 VITALS — BP 134/80 | HR 90 | Ht 64.0 in | Wt 215.0 lb

## 2017-08-09 DIAGNOSIS — M5416 Radiculopathy, lumbar region: Secondary | ICD-10-CM | POA: Diagnosis not present

## 2017-08-09 DIAGNOSIS — G43009 Migraine without aura, not intractable, without status migrainosus: Secondary | ICD-10-CM | POA: Diagnosis not present

## 2017-08-09 DIAGNOSIS — Z792 Long term (current) use of antibiotics: Secondary | ICD-10-CM | POA: Diagnosis not present

## 2017-08-09 DIAGNOSIS — F419 Anxiety disorder, unspecified: Secondary | ICD-10-CM | POA: Diagnosis not present

## 2017-08-09 DIAGNOSIS — F418 Other specified anxiety disorders: Secondary | ICD-10-CM

## 2017-08-09 DIAGNOSIS — Z4789 Encounter for other orthopedic aftercare: Secondary | ICD-10-CM | POA: Diagnosis not present

## 2017-08-09 DIAGNOSIS — F329 Major depressive disorder, single episode, unspecified: Secondary | ICD-10-CM | POA: Diagnosis not present

## 2017-08-09 DIAGNOSIS — T8189XA Other complications of procedures, not elsewhere classified, initial encounter: Secondary | ICD-10-CM | POA: Diagnosis not present

## 2017-08-09 MED ORDER — PREDNISONE 10 MG PO TABS: ORAL_TABLET | ORAL | 0 refills | Status: DC

## 2017-08-09 NOTE — Patient Instructions (Signed)
1.  To break this near daily migraine, I will prescribe you prednisone taper:  Take 60mg  (6 tablets) on day 1   Then 50mg  (5 tablets) on day 2  Then 40mg  (4 tablets) on day 3  Then 30mg  (3 tablets) on day 4  Then 20mg  (2 tablets) on day 5  Then 10mg  (1 tablet) on day 6  Then STOP. 2.  If headaches are not better, then contact me and we can start a daily preventative such as topiramate 3.  While on the prednisone, don't take the Aleve.  Otherwise, continue treating with Aleve and Benadryl 4.   Consider supplements:  Magnesium citrate 400mg  to 600mg  daily, riboflavin 400mg , Coenzyme Q 10 100mg  three times daily 5.  Follow up in 3 months.

## 2017-08-09 NOTE — Progress Notes (Signed)
NEUROLOGY CONSULTATION NOTE  Gina James MRN: [id number] DOB: [birth year]  Referring provider: ED referral Primary care provider: Debbrah Alar  Reason for consult:  headache  HISTORY OF PRESENT ILLNESS: Gina James is a 36 year old female with chronic low back pain status post lumbar surgery, arthritis and anxiety who presents for headache.  She is accompanied by her husband who supplements history.  Onset:  2 weeks ago.  She had back surgery on 62/22/97 which was complicated by infection. Location:  Bilateral frontal/temporal/retro-orbital Quality:  Pressure/squeezing Intensity:  severe Aura:  no Prodrome:  no Postdrome:  fatigue Associated symptoms:  Nausea, photophobia, phonophobia, sometimes vomiting, sees stars in her vision..  She has not had any new worse headache of her life, waking up from sleep Duration:  Until she falls asleep Frequency:  Occurs in the evening prior to bedtime.  10 days in the past 14 days. Frequency of abortive medication: daily Triggers/exacerbating factors:  Stress/anxiety, fatigue Relieving factors:  Headache cocktail at ED Activity:  Unsure as it occurs in the evening and she goes to sleep.  Past NSAIDS:  ibuprofen Past analgesics:  Tylenol, Excedrin Past abortive triptans:  no Past muscle relaxants:  no Past anti-emetic:  Phenergan (side effects), Compazine Past antihypertensive medications:  no Past antidepressant medications:  Lexapro (side effects), bupropion Past anticonvulsant medications:  no Past vitamins/Herbal/Supplements:  B12 Other past therapies:  no  Abortive therapy:  3 Aleve with 2 Benadryl Current NSAIDS:  Aleve Current analgesics: Norco Current triptans:  no Current anti-emetic:  no Current muscle relaxants:  Robaxin 750mg  Current anti-anxiolytic:  no Current sleep aide:  no Current Antihypertensive medications:  no Current Antidepressant medications:  venlafaxine XR 75mg  in AM and 150mg  in PM Current  Anticonvulsant medications:  gabapentin 800mg  three times daily Current Vitamins/Herbal/Supplements:  B6, MVI Current Antihistamines/Decongestants:  Benadryl Other therapy:  no Birth control:  Jolessa  Caffeine:  1/2 to 1 cup coffee daily Alcohol:  no Smoker:  no Diet:  No soda.  Hydrates Exercise:  no Depression/anxiety:  Yes.   Sleep hygiene:  good Family history of headache:  Father  She has history of these headaches since adolescence but they were well-controlled, infrequent and usually occurred around menses.  PAST MEDICAL HISTORY: Past Medical History:  Diagnosis Date  . Ankle fracture, right   . Anxiety   . Arthritis    "in my back" (04/08/2015)  . Chronic lower back pain   . Complication of anesthesia    difficulty urinating after anesthesia  . Headache    "monthly" (04/08/2015)  . Post partum depression     PAST SURGICAL HISTORY: Past Surgical History:  Procedure Laterality Date  . ANTERIOR FUSION LUMBAR SPINE    . BREAST SURGERY    . CESAREAN SECTION  2007 and 2009  . COMBINED ABDOMINOPLASTY AND LIPOSUCTION  12/2014   "liposuction in thighs"  . FRACTURE SURGERY    . LAPAROSCOPIC ABDOMINAL EXPLORATION  1997   "to check for endometriosis"  . LAPAROSCOPIC CHOLECYSTECTOMY  2007  . OPEN REDUCTION INTERNAL FIXATION (ORIF) TIBIA/FIBULA FRACTURE Right 04/08/2015   Procedure: OPEN REDUCTION INTERNAL FIXATION (ORIF)  RIGHT TIBIA PILON FRACTURE;  Surgeon: Wylene Simmer, MD;  Location: [place];  Service: Orthopedics;  Laterality: Right;  . TIBIA FRACTURE SURGERY Right 04/08/2015   ORIF tibial pilon    MEDICATIONS: Current Outpatient Medications on File Prior to Visit  Medication Sig Dispense Refill  . diphenhydrAMINE (BENADRYL) 25 MG tablet Take 25 mg by mouth  every 6 (six) hours as needed.    . docusate sodium (COLACE) 100 MG capsule Take 100 mg by mouth 2 (two) times daily.    Marland Kitchen gabapentin (NEURONTIN) 800 MG tablet Take 800 mg by mouth 3 (three) times daily.    Thurston Pounds  0.15-0.03 MG tablet TAKE ONE TABLET BY MOUTH ONCE DAILY 91 tablet 4  . methocarbamol (ROBAXIN) 750 MG tablet Take 1 tablet (750 mg total) by mouth 2 (two) times daily. 60 tablet 0  . naproxen sodium (ALEVE) 220 MG tablet Take 220 mg by mouth.    . Oxycodone HCl 10 MG TABS Take 5 mg by mouth every 6 (six) hours.    Marland Kitchen venlafaxine XR (EFFEXOR-XR) 150 MG 24 hr capsule Take 1 capsule (150 mg total) by mouth daily. 90 capsule 1  . buPROPion (WELLBUTRIN) 75 MG tablet Take 75 mg by mouth daily.    . prochlorperazine (COMPAZINE) 5 MG tablet Take 5 mg by mouth every 6 (six) hours as needed for nausea or vomiting.    . traMADol (ULTRAM) 50 MG tablet Take by mouth every 6 (six) hours as needed.    . valACYclovir (VALTREX) 500 MG tablet TAKE ONE TABLET BY MOUTH ONCE DAILY (Patient not taking: Reported on 08/09/2017) 30 tablet 5   No current facility-administered medications on file prior to visit.     ALLERGIES: Allergies  Allergen Reactions  . Ciprofloxacin   . Lexapro [Escitalopram] Nausea And Vomiting  . Amoxicillin Hives  . Ceclor [Cefaclor] Hives  . Erythromycin Base Rash  . Penicillins Hives  . Phenergan [Promethazine Hcl] Hives  . Sulfa Antibiotics Hives  . Zithromax [Azithromycin] Hives    FAMILY HISTORY: Family History  Problem Relation Age of Onset  . Diabetes Father   . Cancer Sister 30       breast, had chemo/double mastectomy  . Heart disease Maternal Grandfather   . Heart disease Paternal Grandfather     SOCIAL HISTORY: Social History   Socioeconomic History  . Marital status: Married    Spouse name: Not on file  . Number of children: Not on file  . Years of education: Not on file  . Highest education level: Not on file  Social Needs  . Financial resource strain: Not on file  . Food insecurity - worry: Not on file  . Food insecurity - inability: Not on file  . Transportation needs - medical: Not on file  . Transportation needs - non-medical: Not on file    Occupational History  . Not on file  Tobacco Use  . Smoking status: Former Smoker    Packs/day: 0.12    Years: 10.00    Pack years: 1.20    Types: Cigarettes    Last attempt to quit: 09/06/2004    Years since quitting: 12.9  . Smokeless tobacco: Never Used  Substance and Sexual Activity  . Alcohol use: Yes    Alcohol/week: 2.4 oz    Types: 4 Cans of beer per week    Comment: 04/08/2015 "drink only the weekends"  . Drug use: No  . Sexual activity: Yes    Comment: vasectomy in partner  Other Topics Concern  . 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        REVIEW OF SYSTEMS:  Constitutional: No fevers, chills, or sweats, no generalized fatigue, change in appetite Eyes: No visual changes, double vision, eye pain Ear, nose and throat: No hearing loss, ear pain, nasal congestion, sore throat Cardiovascular: No chest pain, palpitations Respiratory:  No shortness of breath at rest or with exertion, wheezes GastrointestinaI: No nausea, vomiting, diarrhea, abdominal pain, fecal incontinence Genitourinary:  No dysuria, urinary retention or frequency Musculoskeletal:  No neck pain, back pain Integumentary: No rash, pruritus, skin lesions Neurological: as above Psychiatric: No depression, insomnia, anxiety Endocrine: No palpitations, fatigue, diaphoresis, mood swings, change in appetite, change in weight, increased thirst Hematologic/Lymphatic:  No purpura, petechiae. Allergic/Immunologic: no itchy/runny eyes, nasal congestion, recent allergic reactions, rashes  PHYSICAL EXAM: Vitals:   08/09/17 1244  BP: 134/80  Pulse: 90  SpO2: 97%   General: No acute distress.  Patient appears well-groomed.  Head:  Normocephalic/atraumatic Eyes:  fundi examined but not visualized Neck: supple, no paraspinal  tenderness, full range of motion Back: No paraspinal tenderness Heart: regular rate and rhythm Lungs: Clear to auscultation bilaterally. Vascular: No carotid bruits. Neurological Exam: Mental status: alert and oriented to person, place, and time, recent and remote memory intact, fund of knowledge intact, attention and concentration intact, speech fluent and not dysarthric, language intact. Cranial nerves: CN I: not tested CN II: pupils equal, round and reactive to light, visual fields intact CN III, IV, VI:  full range of motion, no nystagmus, no ptosis CN V: facial sensation intact CN VII: upper and lower face symmetric CN VIII: hearing intact CN IX, X: gag intact, uvula midline CN XI: sternocleidomastoid and trapezius muscles intact CN XII: tongue midline Bulk & Tone: normal, no fasciculations. Motor:  5/5 throughout  Sensation: temperature and vibration sensation intact. Deep Tendon Reflexes:  2+ throughout except 3+ in patellars, toes downgoing.  Finger to nose testing:  Without dysmetria.  Heel to shin:  Without dysmetria.  Gait:  Normal station and stride.  Able to turn and tandem walk. Romberg negative.  IMPRESSION: Migraine Depression with Anxiety.  She has history of anxiety.  Migraines may have been triggered by increased anxiety due to complications from her surgery.  PLAN: 1.  Will try prednisone taper to try and abort this daily headache. 2.  If taper ineffective, will start topiramate 3.  Continue Aleve and Benadryl for abortive therapy, however she should hold the Aleve while on the prednisone taper. 4.  Consider magnesium citrate, CoQ10 and riboflavin 5.  Must treat anxiety, which is a significant contributor to headaches.  Has appointment with psychiatry in February. 6.  Limit use of pain relievers to no more than 2 days out of the week 7.  Follow up in 3 months.  45 minutes spent face to face with patient, over 50% spent discussing management. Metta Clines,  DO  CC:  Debbrah Alar, NP

## 2017-08-10 DIAGNOSIS — T8189XA Other complications of procedures, not elsewhere classified, initial encounter: Secondary | ICD-10-CM | POA: Diagnosis not present

## 2017-08-11 DIAGNOSIS — T8189XA Other complications of procedures, not elsewhere classified, initial encounter: Secondary | ICD-10-CM | POA: Diagnosis not present

## 2017-08-12 DIAGNOSIS — Z4789 Encounter for other orthopedic aftercare: Secondary | ICD-10-CM | POA: Diagnosis not present

## 2017-08-12 DIAGNOSIS — Z9889 Other specified postprocedural states: Secondary | ICD-10-CM

## 2017-08-12 DIAGNOSIS — Z981 Arthrodesis status: Secondary | ICD-10-CM | POA: Diagnosis not present

## 2017-08-12 DIAGNOSIS — T8189XA Other complications of procedures, not elsewhere classified, initial encounter: Secondary | ICD-10-CM | POA: Diagnosis not present

## 2017-08-12 DIAGNOSIS — M5136 Other intervertebral disc degeneration, lumbar region: Secondary | ICD-10-CM | POA: Diagnosis not present

## 2017-08-12 HISTORY — DX: Other specified postprocedural states: Z98.890

## 2017-08-13 DIAGNOSIS — T8189XA Other complications of procedures, not elsewhere classified, initial encounter: Secondary | ICD-10-CM | POA: Diagnosis not present

## 2017-08-14 DIAGNOSIS — T8189XA Other complications of procedures, not elsewhere classified, initial encounter: Secondary | ICD-10-CM | POA: Diagnosis not present

## 2017-08-15 DIAGNOSIS — T8189XA Other complications of procedures, not elsewhere classified, initial encounter: Secondary | ICD-10-CM | POA: Diagnosis not present

## 2017-08-16 ENCOUNTER — Encounter: Payer: Self-pay | Admitting: Neurology

## 2017-08-16 DIAGNOSIS — T8189XA Other complications of procedures, not elsewhere classified, initial encounter: Secondary | ICD-10-CM | POA: Diagnosis not present

## 2017-08-17 ENCOUNTER — Ambulatory Visit: Payer: Self-pay

## 2017-08-17 ENCOUNTER — Ambulatory Visit: Payer: BLUE CROSS/BLUE SHIELD | Admitting: Medical

## 2017-08-17 ENCOUNTER — Ambulatory Visit (HOSPITAL_BASED_OUTPATIENT_CLINIC_OR_DEPARTMENT_OTHER)
Admission: RE | Admit: 2017-08-17 | Discharge: 2017-08-17 | Disposition: A | Discharge status: Home / Self Care | Payer: BLUE CROSS/BLUE SHIELD | Source: Ambulatory Visit | Attending: Medical | Admitting: Medical

## 2017-08-17 ENCOUNTER — Encounter: Payer: Self-pay | Admitting: Medical

## 2017-08-17 VITALS — BP 142/86 | HR 84 | Temp 97.8°F | Ht 64.0 in | Wt 207.0 lb

## 2017-08-17 DIAGNOSIS — R6883 Chills (without fever): Secondary | ICD-10-CM | POA: Diagnosis not present

## 2017-08-17 DIAGNOSIS — R5383 Other fatigue: Secondary | ICD-10-CM

## 2017-08-17 DIAGNOSIS — R509 Fever, unspecified: Secondary | ICD-10-CM | POA: Diagnosis not present

## 2017-08-17 DIAGNOSIS — T8189XA Other complications of procedures, not elsewhere classified, initial encounter: Secondary | ICD-10-CM | POA: Diagnosis not present

## 2017-08-17 LAB — POC URINALSYSI DIPSTICK (AUTOMATED)
Bilirubin, UA: NEGATIVE
Glucose, UA: NEGATIVE
Ketones, UA: NEGATIVE
LEUKOCYTES UA: NEGATIVE
NITRITE UA: NEGATIVE
Protein, UA: NEGATIVE
Spec Grav, UA: 1.025 (ref 1.010–1.025)
UROBILINOGEN UA: 0.2 U/dL
pH, UA: 6 (ref 5.0–8.0)

## 2017-08-17 NOTE — Telephone Encounter (Signed)
   Reason for Disposition . [1] MODERATE headache (e.g., interferes with normal activities) AND [2] present > 24 hours AND [3] unexplained  (Exceptions: analgesics not tried, typical migraine, or headache part of viral illness)  Answer Assessment - Initial Assessment Questions 1. LOCATION: "Where does it hurt?"      Starts in her eyes 2. ONSET: "When did the headache start?" (Minutes, hours or days)      Started hot and cold flashes x 2 weeks ago 3. PATTERN: "Does the pain come and go, or has it been constant since it started?"     Hot flashes constant - headache is currently gone 4. SEVERITY: "How bad is the pain?" and "What does it keep you from doing?"  (e.g., Scale 1-10; mild, moderate, or severe)   - MILD (1-3): doesn't interfere with normal activities    - MODERATE (4-7): interferes with normal activities or awakens from sleep    - SEVERE (8-10): excruciating pain, unable to do any normal activities         1-2 5. RECURRENT SYMPTOM: "Have you ever had headaches before?" If so, ask: "When was the last time?" and "What happened that time?"      Yes 6. CAUSE: "What do you think is causing the headache?"     Unsure 7. MIGRAINE: "Have you been diagnosed with migraine headaches?" If so, ask: "Is this headache similar?"      Yes 8. HEAD INJURY: "Has there been any recent injury to the head?"      No 9. OTHER SYMPTOMS: "Do you have any other symptoms?" (fever, stiff neck, eye pain, sore throat, cold symptoms)     Hot and cold flashes, low grade temp. On and off 10. PREGNANCY: "Is there any chance you are pregnant?" "When was your last menstrual period?"       No  Protocols used: HEADACHE-A-AH  Went to Dr. Tana Felts last Tuesday and he put her on "steroid for my headaches." C/o other symptoms today - hot and cold flashes, fatigue.Tearful on the phone. Appointment made for today.

## 2017-08-17 NOTE — Progress Notes (Signed)
Subjective:    Patient ID: Gina James, female    DOB: Jun 02, 1981, 36 y.o.   MRN: [id number]  HPI  Pt in reporting hot and cold episodes for 3 weeks. Pt when she feels hot and break out in sweats. Even in office waiting for me was sweating. She is also waking up in cold sweats at nights. Pt has no obvious uti signs or symptoms. Pt has some mild lower abdomen pain.(she mentions over surgical site where incision was made) But this is minimal and improving per pt. Pt not reporting cough.   Hx of migraines mentioned but none recently.   Husband and pt thought hot and cold sensation may have been from oxycodone use and stopping meds but off pain oxyccodone  for 3 weeks. Pt had back surgery. Pt had lumbar fusion. Pt pain  level decreased great bit since surgery.  Pt now on tramadol for her back pain. Taking medication twice a day. This was written by neurosurgeon about 3 weeks ago when oxycodone stoppped.  Pt was taken off wellbutrin when she was in the hospital and her effexor to total of 225 mg a day.(150 in the evening and 75 mg in am.)  Pt last menstrual cycle was one week ago.  About 3-4 weeks ago when pt was hospitalized she was very anxious. Psychiatrist was consulted.  They thought Wellbutrin was causing anxiety.  They stopped Wellbutrin at that time and increased her Effexor to 225 mg daily as stated above.   Review of Systems  Constitutional: Positive for chills, fatigue and fever.  HENT: Negative for congestion, dental problem, ear pain, hearing loss, postnasal drip, sinus pressure and sinus pain.   Respiratory: Negative for cough, chest tightness, shortness of breath and wheezing.   Cardiovascular: Negative for chest pain and palpitations.  Gastrointestinal: Negative for abdominal distention, abdominal pain, blood in stool, constipation, nausea and vomiting.  Endocrine:       Chills and feeling hot.  Genitourinary: Negative for flank pain and frequency.  Musculoskeletal:  Negative for back pain, myalgias and neck stiffness.  Neurological: Negative for dizziness, tremors, seizures, syncope, weakness and headaches.  Hematological: Negative for adenopathy. Does not bruise/bleed easily.  Psychiatric/Behavioral: Negative for behavioral problems, confusion, dysphoric mood, self-injury, sleep disturbance and suicidal ideas. The patient is not nervous/anxious.        Not reporting acute psychiatric type symptoms presently.    Past Medical History:  Diagnosis Date  . Ankle fracture, right   . Anxiety   . Arthritis    "in my back" (04/08/2015)  . Chronic lower back pain   . Complication of anesthesia    difficulty urinating after anesthesia  . Headache    "monthly" (04/08/2015)  . Post partum depression      Social History   Socioeconomic History  . Marital status: Married    Spouse name: Not on file  . Number of children: Not on file  . Years of education: Not on file  . Highest education level: Not on file  Social Needs  . Financial resource strain: Not on file  . Food insecurity - worry: Not on file  . Food insecurity - inability: Not on file  . Transportation needs - medical: Not on file  . Transportation needs - non-medical: Not on file  Occupational History  . Not on file  Tobacco Use  . Smoking status: Former Smoker    Packs/day: 0.12    Years: 10.00    Pack years: 1.20  Types: Cigarettes    Last attempt to quit: 09/06/2004    Years since quitting: 12.9  . Smokeless tobacco: Never Used  Substance and Sexual Activity  . Alcohol use: Yes    Alcohol/week: 2.4 oz    Types: 4 Cans of beer per week    Comment: 04/08/2015 "drink only the weekends"  . Drug use: No  . Sexual activity: Yes    Comment: vasectomy in partner  Other Topics Concern  . 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        Past Surgical History:  Procedure Laterality Date  . ANTERIOR FUSION LUMBAR SPINE    . BREAST SURGERY    . CESAREAN SECTION  2007 and 2009  . COMBINED ABDOMINOPLASTY AND LIPOSUCTION  12/2014   "liposuction in thighs"  . FRACTURE SURGERY    . LAPAROSCOPIC ABDOMINAL EXPLORATION  1997   "to check for endometriosis"  . LAPAROSCOPIC CHOLECYSTECTOMY  2007  . OPEN REDUCTION INTERNAL FIXATION (ORIF) TIBIA/FIBULA FRACTURE Right 04/08/2015   Procedure: OPEN REDUCTION INTERNAL FIXATION (ORIF)  RIGHT TIBIA PILON FRACTURE;  Surgeon: Wylene Simmer, MD;  Location: [facility];  Service: Orthopedics;  Laterality: Right;  . TIBIA FRACTURE SURGERY Right 04/08/2015   ORIF tibial pilon    Family History  Problem Relation Age of Onset  . Diabetes Father   . Cancer Sister 30       breast, had chemo/double mastectomy  . Heart disease Maternal Grandfather   . Heart disease Paternal Grandfather     Allergies  Allergen Reactions  . Ciprofloxacin   . Lexapro [Escitalopram] Nausea And Vomiting  . Amoxicillin Hives  . Ceclor [Cefaclor] Hives  . Erythromycin Base Rash  . Penicillins Hives  . Phenergan [Promethazine Hcl] Hives  . Sulfa Antibiotics Hives  . Zithromax [Azithromycin] Hives    Current Outpatient Medications on File Prior to Visit  Medication Sig Dispense Refill  . buPROPion (WELLBUTRIN) 75 MG tablet Take 75 mg by mouth daily.    . diphenhydrAMINE (BENADRYL) 25 MG tablet Take 25 mg by mouth every 6 (six) hours as needed.    . docusate sodium (COLACE) 100 MG capsule Take 100 mg by mouth 2 (two) times daily.    Marland Kitchen gabapentin (NEURONTIN) 800 MG tablet Take 800 mg by mouth 3 (three) times daily.    Thurston Pounds 0.15-0.03 MG tablet TAKE ONE TABLET BY MOUTH ONCE DAILY 91 tablet 4  . methocarbamol (ROBAXIN) 750 MG tablet Take 1 tablet (750 mg total) by mouth 2 (two) times daily. 60 tablet 0  . naproxen sodium (ALEVE) 220 MG tablet Take 220 mg by mouth.    . Oxycodone HCl 10 MG  TABS Take 5 mg by mouth every 6 (six) hours.    . predniSONE (DELTASONE) 10 MG tablet Take 6tabs x1day, then 5tabs x1day, then 4tabs x1day, then 3tabs x1day, then 2tabs x1day, then 1tab x1day, then STOP 21 tablet 0  . prochlorperazine (COMPAZINE) 5 MG tablet Take 5 mg by mouth every 6 (six) hours as needed for nausea or vomiting.    . traMADol (ULTRAM) 50 MG tablet Take by mouth every 6 (six) hours as needed.    . valACYclovir (VALTREX) 500 MG tablet TAKE ONE TABLET BY MOUTH ONCE DAILY (Patient not taking: Reported on  08/09/2017) 30 tablet 5  . venlafaxine XR (EFFEXOR-XR) 150 MG 24 hr capsule Take 1 capsule (150 mg total) by mouth daily. 90 capsule 1   No current facility-administered medications on file prior to visit.     BP (!) 142/86   Pulse 84   Temp 97.8 F (36.6 C) (Oral)   Ht 5\' 4"  (1.626 m)   Wt 207 lb (93.9 kg)   SpO2 97%   BMI 35.53 kg/m       Objective:   Physical Exam  General  Mental Status - Alert. General Appearance - Well groomed. Not in acute distress.  Skin Rashes- No Rashes.  HEENT Head- Normal. Ear Auditory Canal - Left- Normal. Right - Normal.Tympanic Membrane- Left- Normal. Right- Normal. Eye Sclera/Conjunctiva- Left- Normal. Right- Normal. Nose & Sinuses Nasal Mucosa- Left-  Boggy and Congested. Right-  Boggy and  Congested.Bilateral maxillary and frontal sinus pressure. Mouth & Throat Lips: Upper Lip- Normal: no dryness, cracking, pallor, cyanosis, or vesicular eruption. Lower Lip-Normal: no dryness, cracking, pallor, cyanosis or vesicular eruption. Buccal Mucosa- Bilateral- No Aphthous ulcers. Oropharynx- No Discharge or Erythema. Tonsils: Characteristics- Bilateral- No Erythema or Congestion. Size/Enlargement- Bilateral- No enlargement. Discharge- bilateral-None.  Neck Neck- Supple. No Masses.   Chest and Lung Exam Auscultation: Breath Sounds:-Clear even and unlabored.  Cardiovascular Auscultation:Rythm- Regular, rate and rhythm. Murmurs  & Other Heart Sounds:Ausculatation of the heart reveal- No Murmurs.  Lymphatic Head & Neck General Head & Neck Lymphatics: Bilateral: Description- No Localized lymphadenopathy.  Back- no cva tenderness  Abdomen- soft, nt, nondistended, +bs, no rebound or guarding.       Assessment & Plan:  For your described  episodes of chills, fatigue and hot sensation(potential fevers) with sweats, I will get labs and chest x-ray.  These labs will include CBC, CMP, TSH, B12 and B1 level.  Also your urine did show some blood present so we will get urine culture.(Also recommend repeating urine in 1-2 weeks to make sure blood no longer present.)  I think it is best to go ahead and do a chest x-ray as well.  You have checked her temperature intermittently over the past weeks.  When you wake up at night you are sweating I also would like you to check your temperature write the readings down.  I will send a message to your PCP regarding your high dose of Effexor use and tramadol.  She has been managing your depression and anxiety.  We will see how she feels about potentially decreasing dose of Effexor.  I am providing you with information on serotonin syndrome to review.  Your recent symptoms I do not think represent serotonin syndrome but I do think you will be helpful going forward for you to have this information.  Your follow-up in 10 days or as needed.

## 2017-08-17 NOTE — Patient Instructions (Addendum)
For your described  episodes of chills, fatigue and hot sensation(potential fevers) with sweats, I will get labs and chest x-ray.  These labs will include CBC, CMP, TSH, B12 and B1 level.  Also your urine did show some blood present so we will get urine culture.(Also recommend repeating urine in 1-2 weeks to make sure blood no longer present.)  I think it is best to go ahead and do a chest x-ray as well.  You have checked her temperature intermittently over the past weeks.  When you wake up at night you are sweating I also would like you to check your temperature write the readings down.  I will send a message to your PCP regarding your high dose of Effexor use and tramadol.  She has been managing your depression and anxiety.  We will see how she feels about potentially decreasing dose of Effexor.  I am providing you with information on serotonin syndrome to review.  Your recent symptoms I do not think represent serotonin syndrome but I do think you will be helpful going forward for you to have this information.  Follow-up in 10 days or as needed  Serotonin Syndrome Serotonin is a brain chemical that regulates the nervous system, which includes the brain, spinal cord, and nerves. Serotonin appears to play a role in all types of behavior, including appetite, emotions, movement, thinking, and response to stress. Excessively high levels of serotonin in the body can cause serotonin syndrome, which is a very dangerous condition. What are the causes? This condition can be caused by taking medicines or drugs that increase the level of serotonin in your body. These include:  Antidepressant medicines.  Migraine medicines.  Certain pain medicines.  Certain recreational drugs, including ecstasy, LSD, cocaine, and amphetamines.  Over-the-counter cough or cold medicines that contain dextromethorphan.  Certain herbal supplements, including St. John's wort, ginseng, and nutmeg.  This condition  usually occurs when you take these medicines or drugs in combination, but it can also happen with a high dose of a single medicine or drug. What increases the risk? This condition is more likely to develop in:  People who have recently increased the dosage of medicine that increases the serotonin level.  People who just started taking medicine that increases the serotonin level.  What are the signs or symptoms? Symptoms of this condition usually happens within several hours of a medicine change. Symptoms include:  Headache.  Muscle twitching or stiffness.  Diarrhea.  Confusion.  Restlessness or agitation.  Shivering or goose bumps.  Loss of muscle coordination.  Rapid heart rate.  Sweating.  Severe cases of serotonin syndromecan cause:  Irregular heartbeat.  Seizures.  Loss of consciousness.  High fever.  How is this diagnosed? This condition is diagnosed with a medical history and physical exam. You will be asked aboutyour symptoms and your use of medicines and recreational drugs. Your health care provider may also order lab work or additional tests to rule out other causes of your symptoms. How is this treated? The treatment for this condition depends on the severity of your symptoms. For mild cases, stopping the medicine that caused your condition is usually all that is needed. For moderate to severe cases, hospitalization is required to monitor you and to prevent further muscle damage. Follow these instructions at home:  Take over-the-counter and prescription medicines only as told by your health care provider. This is important.  Check with your health care provider before you start taking any new prescriptions, over-the-counter medicines,  herbs, or supplements.  Avoid combining any medicines that can cause this condition to occur.  Keep all follow-up visits as told by your health care provider.This is important.  Maintain a healthy lifestyle. ? Eat  healthy foods. ? Get plenty of sleep. ? Exercise regularly. ? Do not drink alcohol. ? Do not use recreational drugs. Contact a health care provider if:  Medicines do not seem to be helping.  Your symptoms do not improve or they get worse.  You have trouble taking care of yourself. Get help right away if:  You have worsening confusion, severe headache, chest pain, high fever, seizures, or loss of consciousness.  You have serious thoughts about hurting yourself or others.  You experience serious side effects of medicine, such as swelling of your face, lips, tongue, or throat. This information is not intended to replace advice given to you by your health care provider. Make sure you discuss any questions you have with your health care provider. Document Released: 09/30/2004 Document Revised: 04/17/2016 Document Reviewed: 09/05/2014 Elsevier Interactive Patient Education  [employer].

## 2017-08-18 ENCOUNTER — Telehealth: Payer: Self-pay | Admitting: Medical

## 2017-08-18 ENCOUNTER — Other Ambulatory Visit: Payer: Self-pay | Admitting: Family

## 2017-08-18 DIAGNOSIS — R7989 Other specified abnormal findings of blood chemistry: Secondary | ICD-10-CM

## 2017-08-18 DIAGNOSIS — T8189XA Other complications of procedures, not elsewhere classified, initial encounter: Secondary | ICD-10-CM | POA: Diagnosis not present

## 2017-08-18 DIAGNOSIS — R799 Abnormal finding of blood chemistry, unspecified: Secondary | ICD-10-CM

## 2017-08-18 LAB — COMPREHENSIVE METABOLIC PANEL
ALBUMIN: 4.5 g/dL (ref 3.5–5.2)
ALK PHOS: 61 U/L (ref 39–117)
ALT: 27 U/L (ref 0–35)
AST: 17 U/L (ref 0–37)
BUN: 14 mg/dL (ref 6–23)
CO2: 26 mEq/L (ref 19–32)
CREATININE: 0.96 mg/dL (ref 0.40–1.20)
Calcium: 9.7 mg/dL (ref 8.4–10.5)
Chloride: 102 mEq/L (ref 96–112)
GFR: 69.16 mL/min (ref >60.00)
Glucose, Bld: 94 mg/dL (ref 70–99)
POTASSIUM: 4.4 meq/L (ref 3.5–5.1)
SODIUM: 139 meq/L (ref 135–145)
TOTAL PROTEIN: 7.9 g/dL (ref 6.0–8.3)
Total Bilirubin: 0.7 mg/dL (ref 0.2–1.2)

## 2017-08-18 LAB — URINE CULTURE
MICRO NUMBER:: 81396842
Result:: NO GROWTH
SPECIMEN QUALITY:: ADEQUATE

## 2017-08-18 LAB — CBC WITH DIFFERENTIAL/PLATELET
Basophils Absolute: 0.1 10*3/uL (ref 0.0–0.1)
Basophils Relative: 1 % (ref 0.0–3.0)
EOS PCT: 2.1 % (ref 0.0–5.0)
Eosinophils Absolute: 0.2 10*3/uL (ref 0.0–0.7)
HCT: 48.2 % — ABNORMAL HIGH (ref 36.0–46.0)
HEMOGLOBIN: 16 g/dL — AB (ref 12.0–15.0)
LYMPHS PCT: 38.9 % (ref 12.0–46.0)
Lymphs Abs: 4.4 10*3/uL — ABNORMAL HIGH (ref 0.7–4.0)
MCHC: 33.3 g/dL (ref 30.0–36.0)
MCV: 89.7 fl (ref 78.0–100.0)
MONO ABS: 0.7 10*3/uL (ref 0.1–1.0)
Monocytes Relative: 6.2 % (ref 3.0–12.0)
Neutro Abs: 5.9 10*3/uL (ref 1.4–7.7)
Neutrophils Relative %: 51.8 % (ref 43.0–77.0)
Platelets: 323 10*3/uL (ref 150.0–400.0)
RBC: 5.37 Mil/uL — AB (ref 3.87–5.11)
RDW: 13.1 % (ref 11.5–15.5)
WBC: 11.4 10*3/uL — AB (ref 4.0–10.5)

## 2017-08-18 LAB — TSH: TSH: 3.75 u[IU]/mL (ref 0.35–4.50)

## 2017-08-18 LAB — VITAMIN B12: Vitamin B-12: 279 pg/mL (ref 211–911)

## 2017-08-18 MED ORDER — TOPIRAMATE 50 MG PO TABS: 50.0000 mg | ORAL_TABLET | Freq: Every day | ORAL | 2 refills | Status: DC

## 2017-08-18 NOTE — Telephone Encounter (Signed)
T4, T3, and TSH future order placed

## 2017-08-19 ENCOUNTER — Other Ambulatory Visit (INDEPENDENT_AMBULATORY_CARE_PROVIDER_SITE_OTHER): Payer: BLUE CROSS/BLUE SHIELD

## 2017-08-19 DIAGNOSIS — R799 Abnormal finding of blood chemistry, unspecified: Secondary | ICD-10-CM | POA: Diagnosis not present

## 2017-08-19 DIAGNOSIS — R7989 Other specified abnormal findings of blood chemistry: Secondary | ICD-10-CM

## 2017-08-19 DIAGNOSIS — T8189XA Other complications of procedures, not elsewhere classified, initial encounter: Secondary | ICD-10-CM | POA: Diagnosis not present

## 2017-08-19 NOTE — Addendum Note (Signed)
Addended by: Harl Bowie on: 08/19/2017 02:12 PM   Modules accepted: Orders

## 2017-08-20 LAB — TSH: TSH: 1.93 mIU/L

## 2017-08-20 LAB — T4: T4, Total: 12 ug/dL — ABNORMAL HIGH (ref 5.1–11.9)

## 2017-08-20 LAB — T3: T3, Total: 131 ng/dL (ref 76–181)

## 2017-08-22 ENCOUNTER — Telehealth: Payer: Self-pay | Admitting: Medical

## 2017-08-22 DIAGNOSIS — R7989 Other specified abnormal findings of blood chemistry: Secondary | ICD-10-CM

## 2017-08-22 LAB — VITAMIN B1: Vitamin B1 (Thiamine): 6 nmol/L — ABNORMAL LOW (ref 8–30)

## 2017-08-22 LAB — T4: T4 TOTAL: 14.8 ug/dL — AB (ref 5.1–11.9)

## 2017-08-22 NOTE — Telephone Encounter (Signed)
Referral to endocrinologist placed. 

## 2017-08-23 ENCOUNTER — Telehealth: Payer: Self-pay | Admitting: Medical

## 2017-08-23 ENCOUNTER — Other Ambulatory Visit (INDEPENDENT_AMBULATORY_CARE_PROVIDER_SITE_OTHER): Payer: BLUE CROSS/BLUE SHIELD

## 2017-08-23 DIAGNOSIS — R319 Hematuria, unspecified: Secondary | ICD-10-CM

## 2017-08-23 LAB — POC URINALSYSI DIPSTICK (AUTOMATED)
Bilirubin, UA: NEGATIVE
Glucose, UA: NEGATIVE
Ketones, UA: NEGATIVE
LEUKOCYTES UA: NEGATIVE
NITRITE UA: NEGATIVE
PROTEIN UA: NEGATIVE
Spec Grav, UA: >=1.03 — AB (ref 1.010–1.025)
UROBILINOGEN UA: 0.2 U/dL
pH, UA: 6 (ref 5.0–8.0)

## 2017-08-23 NOTE — Addendum Note (Signed)
Addended by: Caffie Pinto on: 08/23/2017 01:48 PM   Modules accepted: Orders

## 2017-08-23 NOTE — Telephone Encounter (Signed)
Referral to urologist placed. 

## 2017-08-24 LAB — URINE CULTURE
MICRO NUMBER:: 81421659
RESULT: NO GROWTH
SPECIMEN QUALITY:: ADEQUATE

## 2017-08-25 ENCOUNTER — Encounter: Payer: Self-pay | Admitting: Medical

## 2017-08-26 ENCOUNTER — Other Ambulatory Visit: Payer: Self-pay

## 2017-08-26 ENCOUNTER — Ambulatory Visit: Payer: BLUE CROSS/BLUE SHIELD | Admitting: Family

## 2017-08-26 ENCOUNTER — Emergency Department (HOSPITAL_COMMUNITY)
Admission: EM | Admit: 2017-08-26 | Discharge: 2017-08-26 | Discharge status: Absent without leave | Payer: BLUE CROSS/BLUE SHIELD

## 2017-08-26 ENCOUNTER — Encounter (HOSPITAL_COMMUNITY): Payer: Self-pay | Admitting: Emergency Medicine

## 2017-08-26 ENCOUNTER — Emergency Department (HOSPITAL_COMMUNITY)
Admission: EM | Admit: 2017-08-26 | Discharge: 2017-08-26 | Disposition: A | Discharge status: Home / Self Care | Payer: BLUE CROSS/BLUE SHIELD | Attending: Emergency Medicine | Admitting: Emergency Medicine

## 2017-08-26 ENCOUNTER — Encounter: Payer: Self-pay | Admitting: Family

## 2017-08-26 VITALS — BP 129/76 | HR 81 | Temp 98.5°F | Resp 16 | Ht 64.0 in | Wt 214.4 lb

## 2017-08-26 DIAGNOSIS — G43909 Migraine, unspecified, not intractable, without status migrainosus: Secondary | ICD-10-CM

## 2017-08-26 DIAGNOSIS — Z79899 Other long term (current) drug therapy: Secondary | ICD-10-CM | POA: Diagnosis not present

## 2017-08-26 DIAGNOSIS — F419 Anxiety disorder, unspecified: Secondary | ICD-10-CM

## 2017-08-26 DIAGNOSIS — F410 Panic disorder [episodic paroxysmal anxiety] without agoraphobia: Secondary | ICD-10-CM | POA: Diagnosis not present

## 2017-08-26 DIAGNOSIS — F329 Major depressive disorder, single episode, unspecified: Secondary | ICD-10-CM | POA: Insufficient documentation

## 2017-08-26 DIAGNOSIS — F411 Generalized anxiety disorder: Secondary | ICD-10-CM

## 2017-08-26 DIAGNOSIS — Z87891 Personal history of nicotine dependence: Secondary | ICD-10-CM | POA: Diagnosis not present

## 2017-08-26 DIAGNOSIS — F32A Depression, unspecified: Secondary | ICD-10-CM

## 2017-08-26 DIAGNOSIS — F418 Other specified anxiety disorders: Secondary | ICD-10-CM

## 2017-08-26 LAB — COMPREHENSIVE METABOLIC PANEL
ALBUMIN: 3.9 g/dL (ref 3.5–5.0)
ALT: 20 U/L (ref 14–54)
ANION GAP: 8 (ref 5–15)
AST: 24 U/L (ref 15–41)
Alkaline Phosphatase: 58 U/L (ref 38–126)
BILIRUBIN TOTAL: 0.3 mg/dL (ref 0.3–1.2)
BUN: 13 mg/dL (ref 6–20)
CO2: 24 mmol/L (ref 22–32)
Calcium: 9.3 mg/dL (ref 8.9–10.3)
Chloride: 107 mmol/L (ref 101–111)
Creatinine, Ser: 0.79 mg/dL (ref 0.44–1.00)
GFR calc Af Amer: >60 mL/min (ref >60)
GFR calc non Af Amer: >60 mL/min (ref >60)
GLUCOSE: 81 mg/dL (ref 65–99)
POTASSIUM: 3.8 mmol/L (ref 3.5–5.1)
SODIUM: 139 mmol/L (ref 135–145)
TOTAL PROTEIN: 7.4 g/dL (ref 6.5–8.1)

## 2017-08-26 LAB — URINALYSIS, ROUTINE W REFLEX MICROSCOPIC
Bilirubin Urine: NEGATIVE
Glucose, UA: NEGATIVE mg/dL
Ketones, ur: NEGATIVE mg/dL
Leukocytes, UA: NEGATIVE
NITRITE: NEGATIVE
PROTEIN: NEGATIVE mg/dL
SPECIFIC GRAVITY, URINE: 1.026 (ref 1.005–1.030)
pH: 5 (ref 5.0–8.0)

## 2017-08-26 LAB — CBC
HCT: 41.8 % (ref 36.0–46.0)
HEMOGLOBIN: 14.2 g/dL (ref 12.0–15.0)
MCH: 29.9 pg (ref 26.0–34.0)
MCHC: 34 g/dL (ref 30.0–36.0)
MCV: 88 fL (ref 78.0–100.0)
Platelets: 253 10*3/uL (ref 150–400)
RBC: 4.75 MIL/uL (ref 3.87–5.11)
RDW: 12.8 % (ref 11.5–15.5)
WBC: 9.1 10*3/uL (ref 4.0–10.5)

## 2017-08-26 LAB — ETHANOL

## 2017-08-26 LAB — I-STAT BETA HCG BLOOD, ED (MC, WL, AP ONLY)

## 2017-08-26 LAB — RAPID URINE DRUG SCREEN, HOSP PERFORMED
Amphetamines: NOT DETECTED
BARBITURATES: NOT DETECTED
BENZODIAZEPINES: NOT DETECTED
Cocaine: NOT DETECTED
Opiates: NOT DETECTED
Tetrahydrocannabinol: POSITIVE — AB

## 2017-08-26 LAB — LIPASE, BLOOD: Lipase: 41 U/L (ref 11–51)

## 2017-08-26 LAB — SALICYLATE LEVEL

## 2017-08-26 LAB — ACETAMINOPHEN LEVEL

## 2017-08-26 MED ORDER — TRAZODONE HCL 50 MG PO TABS
50.0000 mg | ORAL_TABLET | Freq: Every day | ORAL | Status: DC
  Administered 2017-08-26: 50 mg via ORAL
  Filled 2017-08-26: qty 1

## 2017-08-26 MED ORDER — HYDROXYZINE HCL 25 MG PO TABS
50.0000 mg | ORAL_TABLET | Freq: Three times a day (TID) | ORAL | Status: DC | PRN
  Administered 2017-08-26: 50 mg via ORAL
  Filled 2017-08-26: qty 2

## 2017-08-26 NOTE — Progress Notes (Signed)
Subjective:    Patient ID: Gina James, female    DOB: [birth year], 36 y.o.   MRN: [id number]  HPI  Pt is a 36 yr old female who presents today for follow up.  1) migraines-  Has had migraine "since the second week in November."  She saw neurology, Dr. Tomi Likens 08/09/17.  He gave her a prednisone taper with plan to start topiramate if this does not help.  She reports that she took prednisone without improvement. Was rx'd topamax which she has been afraid to start.     3) Depression- reports depression/anxiety have been uncontrolled.  Reports that she does not see psychiatry until February.  She is scheduled to see Dr. Toy Care.  She reports that she had one episode of SI, that occurred while she was hospitalized.   Reports that she developed sepsis following her spinal surgery. Was taken off of wellbutrin and effexor was increased.  She reports that since returning home her depression symptoms have been severe, her anxiety is also severe.  She denies current thoughts of hurting herself.  She does report though that she just feels like her whole system is "off."  "I just want to get better."     Review of Systems See HPI  Past Medical History:  Diagnosis Date  . Ankle fracture, right   . Anxiety   . Arthritis    "in my back" (04/08/2015)  . Chronic lower back pain   . Complication of anesthesia    difficulty urinating after anesthesia  . Headache    "monthly" (04/08/2015)  . Post partum depression      Social History   Socioeconomic History  . Marital status: Married    Spouse name: Not on file  . Number of children: Not on file  . Years of education: Not on file  . Highest education level: Not on file  Social Needs  . Financial resource strain: Not on file  . Food insecurity - worry: Not on file  . Food insecurity - inability: Not on file  . Transportation needs - medical: Not on file  . Transportation needs - non-medical: Not on file  Occupational History  . Not on file    Tobacco Use  . Smoking status: Former Smoker    Packs/day: 0.12    Years: 10.00    Pack years: 1.20    Types: Cigarettes    Last attempt to quit: 09/06/2004    Years since quitting: 12.9  . Smokeless tobacco: Never Used  Substance and Sexual Activity  . Alcohol use: Yes    Alcohol/week: 2.4 oz    Types: 4 Cans of beer per week    Comment: 04/08/2015 "drink only the weekends"  . Drug use: No  . Sexual activity: Yes    Comment: vasectomy in partner  Other Topics Concern  . 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        Past Surgical History:  Procedure Laterality Date  . ANTERIOR FUSION LUMBAR SPINE  07/06/2017  . BREAST SURGERY    . CESAREAN SECTION  2007 and 2009  . COMBINED ABDOMINOPLASTY AND LIPOSUCTION  12/2014   "liposuction in thighs"  . FRACTURE SURGERY    . LAPAROSCOPIC ABDOMINAL EXPLORATION  1997   "to check  for endometriosis"  . LAPAROSCOPIC CHOLECYSTECTOMY  2007  . OPEN REDUCTION INTERNAL FIXATION (ORIF) TIBIA/FIBULA FRACTURE Right 04/08/2015   Procedure: OPEN REDUCTION INTERNAL FIXATION (ORIF)  RIGHT TIBIA PILON FRACTURE;  Surgeon: Wylene Simmer, MD;  Location: [facility];  Service: Orthopedics;  Laterality: Right;  . TIBIA FRACTURE SURGERY Right 04/08/2015   ORIF tibial pilon    Family History  Problem Relation Age of Onset  . Diabetes Father   . Cancer Sister 30       breast, had chemo/double mastectomy  . Heart disease Maternal Grandfather   . Heart disease Paternal Grandfather     Allergies  Allergen Reactions  . Ciprofloxacin   . Lexapro [Escitalopram] Nausea And Vomiting  . Amoxicillin Hives  . Ceclor [Cefaclor] Hives  . Erythromycin Base Rash  . Penicillins Hives  . Phenergan [Promethazine Hcl] Hives  . Sulfa Antibiotics Hives  . Zithromax [Azithromycin] Hives    Current  Outpatient Medications on File Prior to Visit  Medication Sig Dispense Refill  . diphenhydrAMINE (BENADRYL) 25 MG tablet Take 25 mg by mouth every 6 (six) hours as needed.    Thurston Pounds 0.15-0.03 MG tablet TAKE ONE TABLET BY MOUTH ONCE DAILY 91 tablet 4  . methocarbamol (ROBAXIN) 750 MG tablet Take 1 tablet (750 mg total) by mouth 2 (two) times daily. 60 tablet 0  . naproxen sodium (ALEVE) 220 MG tablet Take 220 mg by mouth daily as needed.     . valACYclovir (VALTREX) 500 MG tablet TAKE ONE TABLET BY MOUTH ONCE DAILY 30 tablet 5  . venlafaxine XR (EFFEXOR-XR) 150 MG 24 hr capsule Take 1 capsule (150 mg total) by mouth daily. (Patient taking differently: Take 75mg  in the morning and 150mg  at bedtime) 90 capsule 1  . gabapentin (NEURONTIN) 800 MG tablet Take 800 mg by mouth 3 (three) times daily.    . Oxycodone HCl 10 MG TABS Take 5 mg by mouth every 6 (six) hours.    . predniSONE (DELTASONE) 10 MG tablet Take 6tabs x1day, then 5tabs x1day, then 4tabs x1day, then 3tabs x1day, then 2tabs x1day, then 1tab x1day, then STOP (Patient not taking: Reported on 08/26/2017) 21 tablet 0  . prochlorperazine (COMPAZINE) 5 MG tablet Take 5 mg by mouth every 6 (six) hours as needed for nausea or vomiting.    . topiramate (TOPAMAX) 50 MG tablet Take 1 tablet (50 mg total) by mouth at bedtime. (Patient not taking: Reported on 08/26/2017) 30 tablet 2  . traMADol (ULTRAM) 50 MG tablet Take by mouth every 6 (six) hours as needed.     No current facility-administered medications on file prior to visit.     BP 129/76 (BP Location: Right Arm, Cuff Size: Large)   Pulse 81   Temp 98.5 F (36.9 C) (Oral)   Resp 16   Ht 5\' 4"  (1.626 m)   Wt 214 lb 6.4 oz (97.3 kg)   SpO2 99%   BMI 36.80 kg/m       Objective:   Physical Exam  Constitutional: She appears well-developed and well-nourished.  Cardiovascular: Normal rate, regular rhythm and normal heart sounds.  No murmur heard. Pulmonary/Chest: Effort normal and  breath sounds normal. No respiratory distress. She has no wheezes.  Psychiatric: Judgment normal. Her speech is delayed.  Flat affect          Assessment & Plan:  Depression/anxiety-patient scored 23 on PHQ 9.  Uncontrolled and severe though she is not actively suicidal.  Spoke to [facility] health admissions  department and they do not have any beds.  They suggested that patient go to the ED at [facility].  Pt is agreeable to this plan.  I gave report to charge nurse [facility] ED.    Migraines- uncontrolled. Defer management to neurology.

## 2017-08-26 NOTE — ED Notes (Signed)
Per Marzetta Board charge RN pt has not arrived yet. Registration aware to alert triage when pt arrives.

## 2017-08-26 NOTE — ED Triage Notes (Signed)
Pt complaint of "on and off chills", "zapping in head", diarrhea, and nausea since second week of November. Pt continues to verbalize medication changes for anxiety post back surgery and "not right since." Pt denies SI/HI.

## 2017-08-26 NOTE — BH Assessment (Addendum)
[facility] Assessment Progress Note  Case was staffed with Reita Cliche DNP who also met with patient and agreed to evaluate for medication interventions to assist with anxiety. Patient does not meet inpatient criteria and will be discharged this date. Patient will continue to receive services from her current PCP and  follow up with her appointment with Toy Care MD to further assist patient in February and present back to ED if symptoms worsen. Patient is able to contact for safety and denies any thoughts of self harm this date.
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
## 2017-08-26 NOTE — ED Notes (Signed)
Bed: [facility] Expected date:  Expected time:  Means of arrival:  Comments: 

## 2017-08-26 NOTE — Progress Notes (Signed)
36 yo female who presented to the ED with an increase in anxiety and decrease in sleep.  She is concerned that she these symptoms will increase and she will not feel well for Christmas.  Her anxiety has perpetuated more anxiety, discussed mindfulness activities along with medications.  No suicidal/homicidal ideations, hallucinations, or substance abuse issues.  Her husband is at her side and supportive.  Stable for discharge.  Waylan Boga, PMHNP

## 2017-08-26 NOTE — ED Notes (Signed)
Bed: [facility]02 Expected date:  Expected time:  Means of arrival:  Comments: 

## 2017-08-26 NOTE — Patient Instructions (Signed)
Please go directly to the emergency department at [facility] for further evaluation.

## 2017-08-26 NOTE — ED Notes (Signed)
Coming from [facility], states patient is depressed and anxious-cant see psych till Febuary

## 2017-08-26 NOTE — BHH Suicide Risk Assessment (Signed)
Suicide Risk Assessment  Discharge Assessment   [facility] Discharge Suicide Risk Assessment   Principal Problem: General Anxiety Disorder Discharge Diagnoses:  Patient Active Problem List   Diagnosis Date Noted  . Generalized anxiety disorder [F41.1] 06/18/2013    Priority: High  . Spasm of back muscles [M62.830] 11/03/2016  . Depression with anxiety [F41.8] 08/04/2016  . 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 PMDD (premenstrual dysphoric disorder) [F32.81] 07/29/2015  . Pilon fracture [V40.086P] 04/08/2015  . Skin lesion [L98.9] 11/19/2014  . Preventative health care [Z00.00] 10/29/2014  . Obesity [E66.9] 06/10/2014  . 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 Lumbar pain with radiation down both legs [M54.5] 09/11/2013    Total Time spent with patient: 45 minutes  Musculoskeletal: Strength & Muscle Tone: within normal limits Gait & Station: normal Patient leans: N/A  Psychiatric Specialty Exam:   Blood pressure 134/88, pulse 81, temperature 99.5 F (37.5 C), temperature source Oral, resp. rate 16, height 5\' 4"  (1.626 m), weight 97.1 kg (214 lb), last menstrual period 08/19/2017, SpO2 100 %.Body mass index is 36.73 kg/m.  General Appearance: Casual  Eye Contact::  Good  Speech:  Normal Rate  Volume:  Normal  Mood:  Anxious  Affect:  Congruent  Thought Process:  Coherent and Descriptions of Associations: Intact  Orientation:  Full (Time, Place, and Person)  Thought Content:  WDL and Logical  Suicidal Thoughts:  No  Homicidal Thoughts:  No  Memory:  Immediate;   Good Recent;   Good Remote;   Good  Judgement:  Good  Insight:  Good  Psychomotor Activity:  Normal  Concentration:  Good  Recall:  Good  Fund of Knowledge:Good  Language: Good  Akathisia:  No  Handed:  Right  AIMS (if indicated):     Assets:  Communication Skills Desire for Improvement Financial Resources/Insurance Housing Intimacy Leisure Time Physical  Health Resilience Social Support Talents/Skills Transportation Vocational/Educational  Sleep:     Cognition: WNL  ADL's:  Intact   Mental Status Per Nursing Assessment::   On Admission:   36 yo female who presented to the ED with an increase in anxiety and decrease in sleep.  She is concerned that she these symptoms will increase and she will not feel well for Christmas.  Her anxiety has perpetuated more anxiety, discussed mindfulness activities along with medications.  No suicidal/homicidal ideations, hallucinations, or substance abuse issues.  Her husband is at her side and supportive.  Stable for discharge.  Demographic Factors:  Caucasian  Loss Factors: NA  Historical Factors: NA  Risk Reduction Factors:   Responsible for children under 51 years of age, Sense of responsibility to family, Living with another person, especially a relative and Positive social support  Continued Clinical Symptoms:  Anxiety   Cognitive Features That Contribute To Risk:  None    Suicide Risk:  Minimal: No identifiable suicidal ideation.  Patients presenting with no risk factors but with morbid ruminations; may be classified as minimal risk based on the severity of the depressive symptoms    Plan Of Care/Follow-up recommendations:  Activity:  as tolerated Diet:  heart healthy deit  LORD, JAMISON, NP 08/26/2017, 1:16 PM

## 2017-08-26 NOTE — ED Provider Notes (Signed)
[place] DEPT Provider Note   CSN: [id number] Arrival date & time: 08/26/17  5027     History   Chief Complaint Chief Complaint  Patient presents with  . Multiple Complaints  . Anxiety    HPI Gina James is a 36 y.o. female.  Patient is a 35 year old female with a history of depression, anxiety, chronic back pain who underwent recent surgery 6 weeks ago with anterior fusion with complications of infection and sepsis which are now resolved.  Patient states while hospitalized that she was taken off of her Wellbutrin between 3 and 4 weeks ago and her Effexor was increased.  She states she has not felt well since.  She has been having constant diarrhea which is better with Imodium, intermittent nausea which has improved, severe migraine headaches.  She states the increased dose of Effexor is not controlling her anxiety or depression.  At one point she did have a thought of being suicidal but she knows that is not her.  She has seen a neurologist about her migraines but they were not improved with a steroid pack.  She does have an appointment with a psychiatrist in February but her primary physician does not feel comfortable continuing to manage her mental health disorder.  She denies any type of fever, cough, shortness of breath.  She is off all pain medications and was only on them for a few weeks after the surgery.  She denies any hallucinations.   The history is provided by the patient.    Past Medical History:  Diagnosis Date  . Ankle fracture, right   . Anxiety   . Arthritis    "in my back" (04/08/2015)  . Chronic lower back pain   . Complication of anesthesia    difficulty urinating after anesthesia  . Headache    "monthly" (04/08/2015)  . Post partum depression     Patient Active Problem List   Diagnosis Date Noted  . Spasm of back muscles 11/03/2016  . Depression with anxiety 08/04/2016  . HSV-2 (herpes simplex virus 2) infection  04/07/2016  . PMDD (premenstrual dysphoric disorder) 07/29/2015  . Pilon fracture 04/08/2015  . Skin lesion 11/19/2014  . Preventative health care 10/29/2014  . Obesity 06/10/2014  . Chronic meniscal tear of knee 11/23/2013  . Lumbar pain with radiation down both legs 09/11/2013  . Generalized anxiety disorder 06/18/2013    Past Surgical History:  Procedure Laterality Date  . ANTERIOR FUSION LUMBAR SPINE  07/06/2017  . BREAST SURGERY    . CESAREAN SECTION  2007 and 2009  . COMBINED ABDOMINOPLASTY AND LIPOSUCTION  12/2014   "liposuction in thighs"  . FRACTURE SURGERY    . LAPAROSCOPIC ABDOMINAL EXPLORATION  1997   "to check for endometriosis"  . LAPAROSCOPIC CHOLECYSTECTOMY  2007  . OPEN REDUCTION INTERNAL FIXATION (ORIF) TIBIA/FIBULA FRACTURE Right 04/08/2015   Procedure: OPEN REDUCTION INTERNAL FIXATION (ORIF)  RIGHT TIBIA PILON FRACTURE;  Surgeon: Wylene Simmer, MD;  Location: [facility];  Service: Orthopedics;  Laterality: Right;  . TIBIA FRACTURE SURGERY Right 04/08/2015   ORIF tibial pilon    OB History    No data available       Home Medications    Prior to Admission medications   Medication Sig Start Date End Date Taking? Authorizing Provider  diphenhydrAMINE (BENADRYL) 25 MG tablet Take 25 mg by mouth every 6 (six) hours as needed.   Yes [provider]  JOLESSA 0.15-0.03 MG tablet TAKE ONE TABLET BY  MOUTH ONCE DAILY 04/26/17  Yes Debbrah Alar, NP  methocarbamol (ROBAXIN) 750 MG tablet Take 1 tablet (750 mg total) by mouth 2 (two) times daily. 05/30/17  Yes Debbrah Alar, NP  naproxen sodium (ALEVE) 220 MG tablet Take 220 mg by mouth daily as needed (headache).    Yes [provider]  prochlorperazine (COMPAZINE) 5 MG tablet Take 5 mg by mouth every 6 (six) hours as needed for nausea or vomiting.   Yes [provider]  valACYclovir (VALTREX) 500 MG tablet TAKE ONE TABLET BY MOUTH ONCE DAILY 04/26/17  Yes Debbrah Alar, NP  venlafaxine  XR (EFFEXOR-XR) 150 MG 24 hr capsule Take 1 capsule (150 mg total) by mouth daily. Patient taking differently: Take 150 mg by mouth at bedtime.  06/27/17  Yes Debbrah Alar, NP  venlafaxine XR (EFFEXOR-XR) 75 MG 24 hr capsule Take 75 mg by mouth every morning. 07/19/17  Yes [provider]  predniSONE (DELTASONE) 10 MG tablet Take 6tabs x1day, then 5tabs x1day, then 4tabs x1day, then 3tabs x1day, then 2tabs x1day, then 1tab x1day, then STOP Patient not taking: Reported on 08/26/2017 08/09/17   Pieter Partridge, DO  topiramate (TOPAMAX) 50 MG tablet Take 1 tablet (50 mg total) by mouth at bedtime. Patient not taking: Reported on 08/26/2017 08/18/17   Pieter Partridge, DO    Family History Family History  Problem Relation Age of Onset  . Diabetes Father   . Cancer Sister 30       breast, had chemo/double mastectomy  . Heart disease Maternal Grandfather   . Heart disease Paternal Grandfather     Social History Social History   Tobacco Use  . Smoking status: Former Smoker    Packs/day: 0.12    Years: 10.00    Pack years: 1.20    Types: Cigarettes    Last attempt to quit: 09/06/2004    Years since quitting: 12.9  . Smokeless tobacco: Never Used  Substance Use Topics  . Alcohol use: Yes    Alcohol/week: 2.4 oz    Types: 4 Cans of beer per week    Comment: 04/08/2015 "drink only the weekends"  . Drug use: No     Allergies   Ciprofloxacin; Lexapro [escitalopram]; Amoxicillin; Ceclor [cefaclor]; Erythromycin base; Penicillins; Phenergan [promethazine hcl]; Sulfa antibiotics; and Zithromax [azithromycin]   Review of Systems Review of Systems  All other systems reviewed and are negative.    Physical Exam Updated Vital Signs BP 134/88 (BP Location: Left Arm)   Pulse 81   Temp 99.5 F (37.5 C) (Oral)   Resp 16   Ht 5\' 4"  (1.626 m)   Wt 97.1 kg (214 lb)   LMP 08/19/2017   SpO2 100%   BMI 36.73 kg/m   Physical Exam  Constitutional: She is oriented to person,  place, and time. She appears well-developed and well-nourished. No distress.  HENT:  Head: Normocephalic and atraumatic.  Mouth/Throat: Oropharynx is clear and moist.  Eyes: Conjunctivae and EOM are normal. Pupils are equal, round, and reactive to light.  Neck: Normal range of motion. Neck supple.  Cardiovascular: Normal rate, regular rhythm and intact distal pulses.  No murmur heard. Pulmonary/Chest: Effort normal and breath sounds normal. No respiratory distress. She has no wheezes. She has no rales.  Abdominal: Soft. She exhibits no distension. There is no tenderness. There is no rebound and no guarding.  Musculoskeletal: Normal range of motion. She exhibits no edema or tenderness.  Well-healed back incision  Neurological: She is alert  and oriented to person, place, and time.  Skin: Skin is warm and dry. No rash noted. No erythema.  Psychiatric: Her behavior is normal. Her mood appears anxious. Cognition and memory are normal. She exhibits a depressed mood. She expresses no homicidal and no suicidal ideation.  Nursing note and vitals reviewed.    ED Treatments / Results  Labs (all labs ordered are listed, but only abnormal results are displayed) Labs Reviewed  URINALYSIS, ROUTINE W REFLEX MICROSCOPIC - Abnormal; Notable for the following components:      Result Value   Hgb urine dipstick SMALL (*)    Bacteria, UA FEW (*)    Squamous Epithelial / LPF 0-5 (*)    All other components within normal limits  ACETAMINOPHEN LEVEL - Abnormal; Notable for the following components:   Acetaminophen (Tylenol), Serum <10 (*)    All other components within normal limits  RAPID URINE DRUG SCREEN, HOSP PERFORMED - Abnormal; Notable for the following components:   Tetrahydrocannabinol POSITIVE (*)    All other components within normal limits  C DIFFICILE QUICK SCREEN W PCR REFLEX  LIPASE, BLOOD  COMPREHENSIVE METABOLIC PANEL  CBC  ETHANOL  SALICYLATE LEVEL  I-STAT BETA HCG BLOOD, ED ([facility],  [facility], [facility] ONLY)    EKG  EKG Interpretation None       Radiology No results found.  Procedures Procedures (including critical care time)  Medications Ordered in ED Medications - No data to display   Initial Impression / Assessment and Plan / ED Course  I have reviewed the triage vital signs and the nursing notes.  Pertinent labs & imaging results that were available during my care of the patient were reviewed by me and considered in my medical decision making (see chart for details).     Patient presenting with worsening anxiety and depression.  This all started after being hospitalized from sepsis and then having changes in her medications.  She stopped Wellbutrin abruptly and has increased her Effexor per doctor's recommendations.  She feels like she does not have control of her life and things are spiraling.  She feels more anxious and more depressed.  Medically she seems to be getting much better.  She still has some persistent nausea and diarrhea.  She is taking Imodium and diarrhea is down to 3 times a day.  She has never been checked for C. difficile so we will send that stool sample as she is able to give a sample.  However patient had a lot of blood work done within the last few weeks with normal thyroid test and reassuring labs. Concern for possible withdrawal symptoms related to the change in the medications.  Versus her symptoms being also related to medication changes.  Will have TTS evaluate.  Labs are reassuring.  cdiff is pending.   3:01 PM Pt cleared by psych and will d/c home. Final Clinical Impressions(s) / ED Diagnoses   Final diagnoses:  Generalized anxiety disorder  Anxiety  Depression, unspecified depression type    ED Discharge Orders    None       Blanchie Dessert, MD 08/26/17 1501

## 2017-08-26 NOTE — ED Notes (Signed)
Pt called for triage no answer  °

## 2017-08-26 NOTE — ED Notes (Signed)
Patient driving herself home. Discharge instructions reviewed with patient. Patient verbalizes understanding. VSS. Patient given anti-anxiety medication to go.

## 2017-08-26 NOTE — ED Notes (Signed)
Patient advised to come to ED for eval-PCP states patient has been depressed and anxious-denies SI at this time-cant see a psychiatrist till Febuary
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
## 2017-08-27 NOTE — ED Notes (Signed)
Opened chart to answer questions regarding prescriptions, referred call to [facility], TTS

## 2017-09-29 ENCOUNTER — Ambulatory Visit: Payer: BLUE CROSS/BLUE SHIELD | Admitting: Family

## 2017-09-30 ENCOUNTER — Encounter: Payer: Self-pay | Admitting: Family

## 2017-09-30 ENCOUNTER — Ambulatory Visit: Payer: BLUE CROSS/BLUE SHIELD | Admitting: Family

## 2017-09-30 DIAGNOSIS — Z0289 Encounter for other administrative examinations: Secondary | ICD-10-CM

## 2017-10-01 DIAGNOSIS — F329 Major depressive disorder, single episode, unspecified: Secondary | ICD-10-CM | POA: Diagnosis not present

## 2017-10-04 ENCOUNTER — Ambulatory Visit: Payer: BLUE CROSS/BLUE SHIELD | Admitting: Family

## 2017-10-04 ENCOUNTER — Encounter: Payer: Self-pay | Admitting: Family

## 2017-10-04 VITALS — BP 107/74 | HR 94 | Temp 98.4°F | Resp 16 | Ht 64.0 in | Wt 225.4 lb

## 2017-10-04 DIAGNOSIS — F329 Major depressive disorder, single episode, unspecified: Secondary | ICD-10-CM

## 2017-10-04 DIAGNOSIS — F32A Depression, unspecified: Secondary | ICD-10-CM

## 2017-10-04 DIAGNOSIS — F419 Anxiety disorder, unspecified: Secondary | ICD-10-CM | POA: Diagnosis not present

## 2017-10-04 MED ORDER — VENLAFAXINE HCL ER 150 MG PO CP24: 150.0000 mg | ORAL_CAPSULE | Freq: Every day | ORAL | 0 refills | Status: DC

## 2017-10-04 MED ORDER — VENLAFAXINE HCL ER 75 MG PO CP24: 75.0000 mg | ORAL_CAPSULE | Freq: Every morning | ORAL | 1 refills | Status: DC

## 2017-10-04 MED ORDER — HYDROXYZINE PAMOATE 50 MG PO CAPS: 50.0000 mg | ORAL_CAPSULE | Freq: Three times a day (TID) | ORAL | 0 refills | Status: DC | PRN

## 2017-10-04 MED ORDER — TRAZODONE HCL 50 MG PO TABS: 50.0000 mg | ORAL_TABLET | Freq: Every day | ORAL | 0 refills | Status: DC

## 2017-10-04 NOTE — Patient Instructions (Signed)
Please keep your upcoming appointment with Dr.  Toy Care.

## 2017-10-04 NOTE — Progress Notes (Signed)
Subjective:    Patient ID: Gina James, female    DOB: [birth year], 37 y.o.   MRN: [id number]  HPI  Patient is a 37 yr old female who presents today for follow up.  1) Depression/Anxiety-  Scored 23 on PHQ-9 last visit.  Was sent to ED at [facility]. ED record is reviewed. Per patient she was placed on hydroxyzine and trazodone during this visit. She was continued on effexor 225mg . Effexor dose was left the same.  Mood is improved, anxiety is better. Denies SI/HI.  She is working with a Transport planner and has upcoming psych appointment in February.   Review of Systems See HPI  Past Medical History:  Diagnosis Date  . Ankle fracture, right   . Anxiety   . Arthritis    "in my back" (04/08/2015)  . Chronic lower back pain   . Complication of anesthesia    difficulty urinating after anesthesia  . Headache    "monthly" (04/08/2015)  . Post partum depression      Social History   Socioeconomic History  . Marital status: Married    Spouse name: Not on file  . Number of children: Not on file  . Years of education: Not on file  . Highest education level: Not on file  Social Needs  . Financial resource strain: Not on file  . Food insecurity - worry: Not on file  . Food insecurity - inability: Not on file  . Transportation needs - medical: Not on file  . Transportation needs - non-medical: Not on file  Occupational History  . Not on file  Tobacco Use  . Smoking status: Former Smoker    Packs/day: 0.12    Years: 10.00    Pack years: 1.20    Types: Cigarettes    Last attempt to quit: 09/06/2004    Years since quitting: 13.0  . Smokeless tobacco: Never Used  Substance and Sexual Activity  . Alcohol use: Yes    Alcohol/week: 2.4 oz    Types: 4 Cans of beer per week    Comment: 04/08/2015 "drink only the weekends"  . Drug use: No  . Sexual activity: Yes    Comment: vasectomy in partner  Other Topics Concern  . 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        Past Surgical History:  Procedure Laterality Date  . ANTERIOR FUSION LUMBAR SPINE  07/06/2017  . BREAST SURGERY    . CESAREAN SECTION  2007 and 2009  . COMBINED ABDOMINOPLASTY AND LIPOSUCTION  12/2014   "liposuction in thighs"  . FRACTURE SURGERY    . LAPAROSCOPIC ABDOMINAL EXPLORATION  1997   "to check for endometriosis"  . LAPAROSCOPIC CHOLECYSTECTOMY  2007  . OPEN REDUCTION INTERNAL FIXATION (ORIF) TIBIA/FIBULA FRACTURE Right 04/08/2015   Procedure: OPEN REDUCTION INTERNAL FIXATION (ORIF)  RIGHT TIBIA PILON FRACTURE;  Surgeon: Wylene Simmer, MD;  Location: [facility];  Service: Orthopedics;  Laterality: Right;  . TIBIA FRACTURE SURGERY Right 04/08/2015   ORIF tibial pilon    Family History  Problem Relation Age of Onset  . Diabetes Father   . Cancer Sister 30       breast, had chemo/double mastectomy  . Heart disease Maternal Grandfather   . Heart disease Paternal Grandfather  Allergies  Allergen Reactions  . Ciprofloxacin Hives  . Lexapro [Escitalopram] Nausea And Vomiting  . Amoxicillin Hives  . Ceclor [Cefaclor] Hives  . Erythromycin Base Rash  . Penicillins Hives  . Phenergan [Promethazine Hcl] Hives  . Sulfa Antibiotics Hives  . Zithromax [Azithromycin] Hives    Current Outpatient Medications on File Prior to Visit  Medication Sig Dispense Refill  . hydrOXYzine (VISTARIL) 50 MG capsule Take 50 mg by mouth 3 (three) times daily as needed.    [place] Pounds 0.15-0.03 MG tablet TAKE ONE TABLET BY MOUTH ONCE DAILY 91 tablet 4  . methocarbamol (ROBAXIN) 750 MG tablet Take 1 tablet (750 mg total) by mouth 2 (two) times daily. 60 tablet 0  . naproxen sodium (ALEVE) 220 MG tablet Take 220 mg by mouth daily as needed (headache).     . topiramate (TOPAMAX) 50 MG tablet Take 1 tablet (50 mg total) by mouth at bedtime. 30 tablet 2    . traZODone (DESYREL) 50 MG tablet Take 50 mg by mouth at bedtime.    . valACYclovir (VALTREX) 500 MG tablet TAKE ONE TABLET BY MOUTH ONCE DAILY 30 tablet 5  . venlafaxine XR (EFFEXOR-XR) 150 MG 24 hr capsule Take 1 capsule (150 mg total) by mouth daily. (Patient taking differently: Take 150 mg by mouth at bedtime. ) 90 capsule 1  . venlafaxine XR (EFFEXOR-XR) 75 MG 24 hr capsule Take 75 mg by mouth every morning.     No current facility-administered medications on file prior to visit.     BP 107/74 (BP Location: Left Arm, Patient Position: Sitting, Cuff Size: Large)   Pulse 94   Temp 98.4 F (36.9 C) (Oral)   Resp 16   Ht 5\' 4"  (1.626 m)   Wt 225 lb 6.4 oz (102.2 kg)   SpO2 100%   BMI 38.69 kg/m       Objective:   Physical Exam  Constitutional: She is oriented to person, place, and time. She appears well-developed and well-nourished.  Cardiovascular: Normal rate, regular rhythm and normal heart sounds.  No murmur heard. Pulmonary/Chest: Effort normal and breath sounds normal. No respiratory distress. She has no wheezes.  Musculoskeletal: She exhibits no edema.  Neurological: She is alert and oriented to person, place, and time.  Psychiatric: She has a normal mood and affect. Her behavior is normal. Judgment and thought content normal.          Assessment & Plan:  Anxiety/depression-follow-up PHQ 9 today is 5.  This is much improved.  I have advised patient to continue her current medications, continue follow-up with her counselor and keep upcoming appointment with psychiatry.  Patient is agreeable and verbalizes understanding.   A total of 15 minutes were spent face-to-face with the patient during this encounter and over half of that time was spent on counseling and coordination of care. The patient was counseled on *.

## 2017-10-05 DIAGNOSIS — M9902 Segmental and somatic dysfunction of thoracic region: Secondary | ICD-10-CM | POA: Diagnosis not present

## 2017-10-05 DIAGNOSIS — R51 Headache: Secondary | ICD-10-CM | POA: Diagnosis not present

## 2017-10-05 DIAGNOSIS — M9901 Segmental and somatic dysfunction of cervical region: Secondary | ICD-10-CM | POA: Diagnosis not present

## 2017-10-05 DIAGNOSIS — M791 Myalgia, unspecified site: Secondary | ICD-10-CM | POA: Diagnosis not present

## 2017-10-06 DIAGNOSIS — M4722 Other spondylosis with radiculopathy, cervical region: Secondary | ICD-10-CM | POA: Diagnosis not present

## 2017-10-11 ENCOUNTER — Ambulatory Visit: Payer: Self-pay | Admitting: *Deleted

## 2017-10-11 NOTE — Telephone Encounter (Signed)
Pt   Reports   Some  Numbness   And  Tingling   Of  l   Forearm    With  Symptoms  Since  Jan   10    -  Pt  Denies  Any specefic   Injury   Pt  Does  Report   Some  Pain l  Shoulder   As   Well . Pt    Reports some  Weakness  In the  Grip  Of her  L  Hand. Appointment  Made   With Mackie Pai   For tommorow     Pt  Advised   To   Call  Back  Or  Call 911  If  Symptoms   Worse    Reason for Disposition . [1] Weakness of arm / hand, or leg / foot AND [2] is a chronic symptom (recurrent or ongoing AND present > 4 weeks)  Answer Assessment - Initial Assessment Questions 1. SYMPTOM: "What is the main symptom you are concerned about?" (e.g., weakness, numbness)    L  Arm has  Numbness  And  Tingling  From  Forearm to  Fingers    Some  Weakness  In  l  Hand  As   Well  When  Grips  Object   2. ONSET: "When did this start?" (minutes, hours, days; while sleeping)       Jan  10   Which  Is  16  Days     3. LAST NORMAL: "When was the last time you were normal (no symptoms)?"        Was  Normal  Prior  To  That   4. PATTERN "Does this come and go, or has it been constant since it started?"  "Is it present now?"     Started  Out  As  Tingling in fingertips  And  Gradually  Got  Worse  Symptoms  Are  Constant   Present  Now   5. CARDIAC SYMPTOMS: "Have you had any of the following symptoms: chest pain, difficulty breathing, palpitations?"       No 6. NEUROLOGIC SYMPTOMS: "Have you had any of the following symptoms: headache, dizziness, vision loss, double vision, changes in speech, unsteady on your feet?"     Dizzy headache   intermittant  Present prior  To this  Episode  But  Getting  Worse    7. OTHER SYMPTOMS: "Do you have any other symptoms?"       Pain  In l   Shoulder radiates  Down  Ward to  Elbow - tingling aggrevated  By  Movement   Varies  In intensity   No  Known  Injury    8. PREGNANCY: "Is there any chance you are pregnant?" "When was your last menstrual period?"        BCP      Does  Not  have  Regular  Periods     No  Chance  Of  Pregnancy  Protocols used: NEUROLOGIC DEFICIT-A-AH

## 2017-10-12 ENCOUNTER — Ambulatory Visit: Payer: BLUE CROSS/BLUE SHIELD | Admitting: Medical

## 2017-10-12 ENCOUNTER — Ambulatory Visit (HOSPITAL_BASED_OUTPATIENT_CLINIC_OR_DEPARTMENT_OTHER)
Admission: RE | Admit: 2017-10-12 | Discharge: 2017-10-12 | Disposition: A | Discharge status: Home / Self Care | Payer: BLUE CROSS/BLUE SHIELD | Source: Ambulatory Visit | Attending: Medical | Admitting: Medical

## 2017-10-12 ENCOUNTER — Encounter: Payer: Self-pay | Admitting: Medical

## 2017-10-12 VITALS — BP 122/79 | HR 103 | Temp 99.6°F | Resp 16 | Ht 64.0 in | Wt 224.0 lb

## 2017-10-12 DIAGNOSIS — M50322 Other cervical disc degeneration at C5-C6 level: Secondary | ICD-10-CM | POA: Insufficient documentation

## 2017-10-12 DIAGNOSIS — G629 Polyneuropathy, unspecified: Secondary | ICD-10-CM | POA: Diagnosis not present

## 2017-10-12 DIAGNOSIS — R0981 Nasal congestion: Secondary | ICD-10-CM | POA: Diagnosis not present

## 2017-10-12 DIAGNOSIS — M542 Cervicalgia: Secondary | ICD-10-CM

## 2017-10-12 DIAGNOSIS — J40 Bronchitis, not specified as acute or chronic: Secondary | ICD-10-CM | POA: Diagnosis not present

## 2017-10-12 MED ORDER — HYDROCODONE-HOMATROPINE 5-1.5 MG/5ML PO SYRP: 5.0000 mL | ORAL_SOLUTION | Freq: Three times a day (TID) | ORAL | 0 refills | Status: DC | PRN

## 2017-10-12 MED ORDER — DOXYCYCLINE HYCLATE 100 MG PO TABS: 100.0000 mg | ORAL_TABLET | Freq: Two times a day (BID) | ORAL | 0 refills | Status: DC

## 2017-10-12 MED ORDER — GABAPENTIN 300 MG PO CAPS: 300.0000 mg | ORAL_CAPSULE | Freq: Three times a day (TID) | ORAL | 3 refills | Status: DC

## 2017-10-12 MED ORDER — FLUTICASONE PROPIONATE 50 MCG/ACT NA SUSP: 2.0000 | Freq: Every day | NASAL | 1 refills | Status: DC

## 2017-10-12 NOTE — Progress Notes (Signed)
Subjective:    Patient ID: Gina James, female    DOB: Mar 04, 1981, 37 y.o.   MRN: [id number]  HPI  Pt in with some tingling type pain to her hands and some in her upper extremity for two weeks. Pt has history of some neck pain present for 2 weeks as well. Pt went to chiropracter. They did some therapy/manipulation on her. She states helped neck pain but it did not help tingling and numb sensation to her hands. States electrical shooting type pain down back of her arm.  She states chiropracter states she had some degenerative changes to c5 area.  Pt states when she turns her head to the left will get shooting pain down her arm.  Pt does have history of lower back pain with fusion. Pt states just recovered from this surgery when the above started hurting.  Pt also has recent nasal congestion for 1 month. Has pnd, some colored mucus when she blows her nose and some productive. Pt productive cough for about 2 weeks.  Pt denies any chest pain, no sob or any jaw pain.  LMP- on seasonal.    Review of Systems  Constitutional: Negative for chills, fatigue and fever.  HENT: Positive for congestion. Negative for hearing loss, mouth sores, postnasal drip, sinus pressure and sinus pain.   Respiratory: Positive for cough. Negative for chest tightness, shortness of breath and wheezing.   Cardiovascular: Negative for chest pain and palpitations.  Gastrointestinal: Negative for abdominal pain.  Genitourinary: Negative for difficulty urinating and enuresis.  Musculoskeletal: Negative for back pain, myalgias and neck stiffness.       See HPI  Skin: Negative for rash.  Neurological: Negative for dizziness, speech difficulty, numbness and headaches.  Hematological: Negative for adenopathy. Does not bruise/bleed easily.  Psychiatric/Behavioral: Negative for behavioral problems and confusion.    Past Medical History:  Diagnosis Date  . Ankle fracture, right   . Anxiety   . Arthritis    "in my  back" (04/08/2015)  . Chronic lower back pain   . Complication of anesthesia    difficulty urinating after anesthesia  . Headache    "monthly" (04/08/2015)  . Post partum depression      Social History   Socioeconomic History  . Marital status: Married    Spouse name: Not on file  . Number of children: Not on file  . Years of education: Not on file  . Highest education level: Not on file  Social Needs  . Financial resource strain: Not on file  . Food insecurity - worry: Not on file  . Food insecurity - inability: Not on file  . Transportation needs - medical: Not on file  . Transportation needs - non-medical: Not on file  Occupational History  . Not on file  Tobacco Use  . Smoking status: Former Smoker    Packs/day: 0.12    Years: 10.00    Pack years: 1.20    Types: Cigarettes    Last attempt to quit: 09/06/2004    Years since quitting: 13.1  . Smokeless tobacco: Never Used  Substance and Sexual Activity  . Alcohol use: Yes    Alcohol/week: 2.4 oz    Types: 4 Cans of beer per week    Comment: 04/08/2015 "drink only the weekends"  . Drug use: No  . Sexual activity: Yes    Comment: vasectomy in partner  Other Topics Concern  . 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        Past Surgical History:  Procedure Laterality Date  . ANTERIOR FUSION LUMBAR SPINE  07/06/2017  . BREAST SURGERY    . CESAREAN SECTION  2007 and 2009  . COMBINED ABDOMINOPLASTY AND LIPOSUCTION  12/2014   "liposuction in thighs"  . FRACTURE SURGERY    . LAPAROSCOPIC ABDOMINAL EXPLORATION  1997   "to check for endometriosis"  . LAPAROSCOPIC CHOLECYSTECTOMY  2007  . OPEN REDUCTION INTERNAL FIXATION (ORIF) TIBIA/FIBULA FRACTURE Right 04/08/2015   Procedure: OPEN REDUCTION INTERNAL FIXATION (ORIF)  RIGHT TIBIA PILON FRACTURE;   Surgeon: Wylene Simmer, MD;  Location: [place];  Service: Orthopedics;  Laterality: Right;  . TIBIA FRACTURE SURGERY Right 04/08/2015   ORIF tibial pilon    Family History  Problem Relation Age of Onset  . Diabetes Father   . Cancer Sister 30       breast, had chemo/double mastectomy  . Heart disease Maternal Grandfather   . Heart disease Paternal Grandfather     Allergies  Allergen Reactions  . Ciprofloxacin Hives  . Lexapro [Escitalopram] Nausea And Vomiting  . Amoxicillin Hives  . Ceclor [Cefaclor] Hives  . Erythromycin Base Rash  . Penicillins Hives  . Phenergan [Promethazine Hcl] Hives  . Sulfa Antibiotics Hives  . Zithromax [Azithromycin] Hives    Current Outpatient Medications on File Prior to Visit  Medication Sig Dispense Refill  . hydrOXYzine (VISTARIL) 50 MG capsule Take 1 capsule (50 mg total) by mouth 3 (three) times daily as needed. 30 capsule 0  . JOLESSA 0.15-0.03 MG tablet TAKE ONE TABLET BY MOUTH ONCE DAILY 91 tablet 4  . methocarbamol (ROBAXIN) 750 MG tablet Take 1 tablet (750 mg total) by mouth 2 (two) times daily. 60 tablet 0  . naproxen sodium (ALEVE) 220 MG tablet Take 220 mg by mouth daily as needed (headache).     . topiramate (TOPAMAX) 50 MG tablet Take 1 tablet (50 mg total) by mouth at bedtime. 30 tablet 2  . valACYclovir (VALTREX) 500 MG tablet TAKE ONE TABLET BY MOUTH ONCE DAILY 30 tablet 5  . venlafaxine XR (EFFEXOR-XR) 150 MG 24 hr capsule Take 1 capsule (150 mg total) by mouth at bedtime. 30 capsule 0  . venlafaxine XR (EFFEXOR-XR) 75 MG 24 hr capsule Take 1 capsule (75 mg total) by mouth every morning. 30 capsule 1  . traZODone (DESYREL) 50 MG tablet Take 1 tablet (50 mg total) by mouth at bedtime. (Patient not taking: Reported on 10/12/2017) 30 tablet 0   No current facility-administered medications on file prior to visit.     BP 122/79   Pulse (!) 103   Temp 99.6 F (37.6 C) (Oral)   Resp 16   Ht 5\' 4"  (1.626 m)   Wt 224 lb (101.6 kg)    SpO2 100%   BMI 38.45 kg/m       Objective:   Physical Exam  General  Mental Status - Alert. General Appearance - Well groomed. Not in acute distress.  Skin Rashes- No Rashes.  HEENT Head- Normal. Ear Auditory Canal - Left- Normal. Right - Normal.Tympanic Membrane- Left- Normal. Right- Normal. Eye Sclera/Conjunctiva- Left- Normal. Right- Normal. Nose & Sinuses Nasal Mucosa- Left-  Boggy and Congested. Right-  Boggy and  Congested.Bilateral no maxillary and no  frontal sinus  pressure. Mouth & Throat Lips: Upper Lip- Normal: no dryness, cracking, pallor, cyanosis, or vesicular eruption. Lower Lip-Normal: no dryness, cracking, pallor, cyanosis or vesicular eruption. Buccal Mucosa- Bilateral- No Aphthous ulcers. Oropharynx- No Discharge or Erythema. Tonsils: Characteristics- Bilateral- No Erythema or Congestion. Size/Enlargement- Bilateral- No enlargement. Discharge- bilateral-None.  Neck Neck- Supple. No Masses.  Left trapezius pain on palpation.  No mid cervical spine tenderness.  When she turns her neck to the left she can feel pain radiating down her left arm.   Chest and Lung Exam Auscultation: Breath Sounds:-Clear even and unlabored.  Cardiovascular Auscultation:Rythm- Regular, rate and rhythm. Murmurs & Other Heart Sounds:Ausculatation of the heart reveal- No Murmurs.  Lymphatic Head & Neck General Head & Neck Lymphatics: Bilateral: Description- No Localized lymphadenopathy.  Left upper extremity-strength is about 4 out of 5 hand grip compared to right side which is 5 out of 5.  Patient has sharp and dull discrimination intact on testing of hands and digits.      Assessment & Plan:  For your recent neck pain with radicular pain, I am going to get cervical spine x-ray and prescribe you Neurontin.  I am going to give you moderate dose of Neurontin as you have been on very high doses in the past with no severe sedation or adverse side effects.  Can use low-dose  ibuprofen for pain and inflammation.  If cervical spine x-ray is negative and your pain persist would order MRI.  For patient's nasal congestion for 1 month and bronchitis type symptoms for 2 weeks, I am prescribing Flonase, azithromycin antibiotic and Hycodan for cough.  Note the cough syrup would give relief from cough and also give some secondary pain control benefit.  Follow-up in 7-10 days or as needed.  Mackie Pai, PA-C

## 2017-10-12 NOTE — Patient Instructions (Addendum)
For your recent neck pain with radicular pain, I am going to get cervical spine x-ray and prescribe you Neurontin.  I am going to give you moderate dose of Neurontin as you have been on very high doses in the past with no severe sedation or adverse side effects.  Can use low-dose ibuprofen for pain and inflammation.  If cervical spine x-ray is negative and your pain persist would order MRI.  For patient's nasal congestion for 1 month and bronchitis type symptoms for 2 weeks, I am prescribing Flonase, doxycycline antibiotic and Hycodan for cough.  Note the cough syrup would give relief from cough and also give some secondary pain control benefit.  Follow-up in 7-10 days or as needed.

## 2017-10-14 ENCOUNTER — Encounter: Payer: Self-pay | Admitting: Medical

## 2017-10-18 ENCOUNTER — Telehealth: Payer: Self-pay | Admitting: Medical

## 2017-10-18 DIAGNOSIS — M5410 Radiculopathy, site unspecified: Secondary | ICD-10-CM

## 2017-10-18 DIAGNOSIS — M542 Cervicalgia: Secondary | ICD-10-CM

## 2017-10-18 NOTE — Telephone Encounter (Signed)
MRI of patient's neck ordered due to neck pain with radicular features.  Patient requests this and by her symptoms it seems reasonable

## 2017-10-18 NOTE — Telephone Encounter (Signed)
Placed the cervical spine MRI order.  Would you see if that preauthorization goes through.

## 2017-10-20 ENCOUNTER — Telehealth: Payer: Self-pay | Admitting: Medical

## 2017-10-20 ENCOUNTER — Ambulatory Visit: Payer: BLUE CROSS/BLUE SHIELD | Admitting: Medical

## 2017-10-20 ENCOUNTER — Encounter: Payer: Self-pay | Admitting: Medical

## 2017-10-20 VITALS — BP 119/75 | HR 99 | Temp 98.2°F | Resp 16 | Ht 64.0 in | Wt 224.8 lb

## 2017-10-20 DIAGNOSIS — R202 Paresthesia of skin: Secondary | ICD-10-CM

## 2017-10-20 DIAGNOSIS — M5410 Radiculopathy, site unspecified: Secondary | ICD-10-CM | POA: Diagnosis not present

## 2017-10-20 DIAGNOSIS — M542 Cervicalgia: Secondary | ICD-10-CM

## 2017-10-20 DIAGNOSIS — M79609 Pain in unspecified limb: Secondary | ICD-10-CM

## 2017-10-20 NOTE — Progress Notes (Addendum)
Subjective:    Patient ID: Gina James, female    DOB: 26-Apr-1981, 37 y.o.   MRN: [id number]  HPI  Pt still has some pain that is radiating down her left shoulder down back of her arm to her hand. Pt states getting feeling like her hand is wet but it is not. Also getting nonstop tingle sensation in her hands. Pt has seen chiropracter and her neck pain is improved slightly  but the radicular pain has not improved.  Also pt thinks her left thumb grip strength is weaker now.  Overall now 3 weeks of shooting pain down left upper ext and not getting better.  In the recent past she would turn her head to the left and this would make then would feel shooting sensation down her arm.  Pt has pain despite use of ibuprofen and neurontin  Xray of cspine showed  Early degenerative disc changes at C5-6 and C6-7 with disc space narrowing and anterior spurring. Normal alignment. No fracture. No neural foraminal narrowing. Prevertebral soft tissues are normal.   IMPRESSION: Early degenerative disc disease in the lower cervical spine. No significant neural foraminal narrowing. No acute findings.  I ordered mri of neck recently when pt states not better but so far declined by insurance.    Review of Systems  Constitutional: Negative for chills, fatigue and fever.  Respiratory: Negative for chest tightness, shortness of breath and stridor.   Cardiovascular: Negative for chest pain and palpitations.  Musculoskeletal: Positive for neck pain. Negative for joint swelling and neck stiffness.  Skin:       No rash or vesicles on arm.  Neurological:       Left side upper ext radicular pain..  Hematological: Negative for adenopathy. Does not bruise/bleed easily.  Psychiatric/Behavioral: Negative for behavioral problems and dysphoric mood.   Past Medical History:  Diagnosis Date  . Ankle fracture, right   . Anxiety   . Arthritis    "in my back" (04/08/2015)  . Chronic lower back pain   .  Complication of anesthesia    difficulty urinating after anesthesia  . Headache    "monthly" (04/08/2015)  . Post partum depression      Social History   Socioeconomic History  . Marital status: Married    Spouse name: Not on file  . Number of children: Not on file  . Years of education: Not on file  . Highest education level: Not on file  Social Needs  . Financial resource strain: Not on file  . Food insecurity - worry: Not on file  . Food insecurity - inability: Not on file  . Transportation needs - medical: Not on file  . Transportation needs - non-medical: Not on file  Occupational History  . Not on file  Tobacco Use  . Smoking status: Former Smoker    Packs/day: 0.12    Years: 10.00    Pack years: 1.20    Types: Cigarettes    Last attempt to quit: 09/06/2004    Years since quitting: 13.1  . Smokeless tobacco: Never Used  Substance and Sexual Activity  . Alcohol use: Yes    Alcohol/week: 2.4 oz    Types: 4 Cans of beer per week    Comment: 04/08/2015 "drink only the weekends"  . Drug use: No  . Sexual activity: Yes    Comment: vasectomy in partner  Other Topics Concern  . 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        Past Surgical History:  Procedure Laterality Date  . ANTERIOR FUSION LUMBAR SPINE  07/06/2017  . BREAST SURGERY    . CESAREAN SECTION  2007 and 2009  . COMBINED ABDOMINOPLASTY AND LIPOSUCTION  12/2014   "liposuction in thighs"  . FRACTURE SURGERY    . LAPAROSCOPIC ABDOMINAL EXPLORATION  1997   "to check for endometriosis"  . LAPAROSCOPIC CHOLECYSTECTOMY  2007  . OPEN REDUCTION INTERNAL FIXATION (ORIF) TIBIA/FIBULA FRACTURE Right 04/08/2015   Procedure: OPEN REDUCTION INTERNAL FIXATION (ORIF)  RIGHT TIBIA PILON FRACTURE;  Surgeon: Wylene Simmer, MD;  Location: [place];   Service: Orthopedics;  Laterality: Right;  . TIBIA FRACTURE SURGERY Right 04/08/2015   ORIF tibial pilon    Family History  Problem Relation Age of Onset  . Diabetes Father   . Cancer Sister 30       breast, had chemo/double mastectomy  . Heart disease Maternal Grandfather   . Heart disease Paternal Grandfather     Allergies  Allergen Reactions  . Ciprofloxacin Hives  . Lexapro [Escitalopram] Nausea And Vomiting  . Amoxicillin Hives  . Ceclor [Cefaclor] Hives  . Erythromycin Base Rash  . Penicillins Hives  . Phenergan [Promethazine Hcl] Hives  . Sulfa Antibiotics Hives  . Zithromax [Azithromycin] Hives    Current Outpatient Medications on File Prior to Visit  Medication Sig Dispense Refill  . doxycycline (VIBRA-TABS) 100 MG tablet Take 1 tablet (100 mg total) by mouth 2 (two) times daily. Can give caps or generic 20 tablet 0  . fluticasone (FLONASE) 50 MCG/ACT nasal spray Place 2 sprays into both nostrils daily. 16 g 1  . gabapentin (NEURONTIN) 300 MG capsule Take 1 capsule (300 mg total) by mouth 3 (three) times daily. 90 capsule 3  . HYDROcodone-homatropine (HYCODAN) 5-1.5 MG/5ML syrup Take 5 mLs by mouth every 8 (eight) hours as needed. 100 mL 0  . hydrOXYzine (VISTARIL) 50 MG capsule Take 1 capsule (50 mg total) by mouth 3 (three) times daily as needed. 30 capsule 0  . JOLESSA 0.15-0.03 MG tablet TAKE ONE TABLET BY MOUTH ONCE DAILY 91 tablet 4  . methocarbamol (ROBAXIN) 750 MG tablet Take 1 tablet (750 mg total) by mouth 2 (two) times daily. 60 tablet 0  . naproxen sodium (ALEVE) 220 MG tablet Take 220 mg by mouth daily as needed (headache).     . topiramate (TOPAMAX) 50 MG tablet Take 1 tablet (50 mg total) by mouth at bedtime. 30 tablet 2  . traZODone (DESYREL) 50 MG tablet Take 1 tablet (50 mg total) by mouth at bedtime. 30 tablet 0  . valACYclovir (VALTREX) 500 MG tablet TAKE ONE TABLET BY MOUTH ONCE DAILY 30 tablet 5  . venlafaxine XR (EFFEXOR-XR) 150 MG 24 hr capsule  Take 1 capsule (150 mg total) by mouth at bedtime. 30 capsule 0  . venlafaxine XR (EFFEXOR-XR) 75 MG 24 hr capsule Take 1 capsule (75 mg total) by mouth every morning. 30 capsule 1   No current facility-administered medications on file prior to visit.     BP 119/75 (BP Location: Right Arm, Patient Position: Sitting, Cuff Size: Large)   Pulse 99   Temp 98.2 F (36.8 C) (Oral)   Resp 16   Ht 5\' 4"  (1.626 m)   Wt 224 lb 12.8 oz (  102 kg)   SpO2 100%   BMI 38.59 kg/m        Objective:   Physical Exam   General  Mental Status - Alert. General Appearance - Well groomed. Not in acute distress.  Skin Rashes- No Rashes.  Neck Neck- Supple. No Masses.  Left trapezius pain on palpation.  No mid cervical spine tenderness.  When she turns her neck to the left she can feel pain radiating down her left arm. When I press on her neck left side of neck paraspinal area induces the radicular pain.   Chest and Lung Exam Auscultation: Breath Sounds:-Clear even and unlabored.  Cardiovascular Auscultation:Rythm- Regular, rate and rhythm. Murmurs & Other Heart Sounds:Ausculatation of the heart reveal- No Murmurs.  Lymphatic Head & Neck General Head & Neck Lymphatics: Bilateral: Description- No Localized lymphadenopathy.  Left upper extremity-strength is about 4 out of 5 hand grip compared to right side which is 5 out of 5. Patient has sharp and dull discrimination intact on testing of hands and digits.     Assessment & Plan:  For your persisting neck pain, radicular pain and tingling sensation to your hand, I am asking referral staff to try to get MRI authorized again.  I explained to her that you are not much improved particularly in regards to the radicular pain down your arm and now has new tingling sensation to hand with intermittent wet sensation.  She will tried to get the MRI authorized but if he fails I asked her to go ahead and call upstairs to schedule you appointment with  sports medicine.  They might agree and be successful in ordering the MRI.  Currently continue the ibuprofen and Neurontin.  She has some incremental improvement in the neck discomfort with chiropractor so he can continue that.  Follow-up date to be determined after MRI for after you see sports medicine

## 2017-10-20 NOTE — Telephone Encounter (Signed)
Was pt able to get in with sports medicine tomorrow or today.

## 2017-10-20 NOTE — Patient Instructions (Addendum)
For your persisting neck pain, radicular pain and tingling sensation to your hand, I am asking referral staff to try to get MRI authorized again.  I explained to her that you are not much improved particularly in regards to the radicular pain down your arm and now has new tingling sensation to hand with intermittent wet sensation.  She will tried to get the MRI authorized but if he fails I asked her to go ahead and call upstairs to schedule you appointment with sports medicine.  They might agree and be successful in ordering the MRI.  Currently continue the ibuprofen and Neurontin.  She  has some incremental improvement in the neck discomfort with chiropractor so he can continue that.  Follow-up date to be determined after MRI for after you see sports medicine

## 2017-10-21 ENCOUNTER — Ambulatory Visit: Payer: BLUE CROSS/BLUE SHIELD | Admitting: Family Medicine

## 2017-10-21 ENCOUNTER — Encounter: Payer: Self-pay | Admitting: Family Medicine

## 2017-10-21 VITALS — BP 111/63 | HR 64 | Ht 64.0 in | Wt 220.0 lb

## 2017-10-21 DIAGNOSIS — M5010 Cervical disc disorder with radiculopathy, unspecified cervical region: Secondary | ICD-10-CM

## 2017-10-21 DIAGNOSIS — M542 Cervicalgia: Secondary | ICD-10-CM | POA: Diagnosis not present

## 2017-10-21 HISTORY — DX: Cervical disc disorder with radiculopathy, unspecified cervical region: M50.10

## 2017-10-21 MED ORDER — PREDNISONE 10 MG PO TABS: ORAL_TABLET | ORAL | 0 refills | Status: DC

## 2017-10-21 MED ORDER — METHOCARBAMOL 750 MG PO TABS: 750.0000 mg | ORAL_TABLET | Freq: Two times a day (BID) | ORAL | 1 refills | Status: DC | PRN

## 2017-10-21 NOTE — Progress Notes (Addendum)
PCP: Debbrah Alar, NP Consultation requested by: Mackie Pai PA-C  Subjective:   HPI: Patient is a 37 y.o. female here for neck, arm pain.  Patient recently recovered from lumbar spine surgery. She reports for about 3 weeks now she's had pain in left side of neck radiating into arm. Started when she woke up with tingling into left index and middle fingertips. Progressed to include bad neck pain, headaches. Numbness ascended and started to include her left thumb. She has tried gabapentin, aleve. No bowel/bladder dysfunction. Pain level 2/10 at the neck but 6/10 into left arm and shoulder, sharp.  Past Medical History:  Diagnosis Date  . Ankle fracture, right   . Anxiety   . Arthritis    "in my back" (04/08/2015)  . Chronic lower back pain   . Complication of anesthesia    difficulty urinating after anesthesia  . Headache    "monthly" (04/08/2015)  . Post partum depression     Current Outpatient Medications on File Prior to Visit  Medication Sig Dispense Refill  . doxycycline (VIBRA-TABS) 100 MG tablet Take 1 tablet (100 mg total) by mouth 2 (two) times daily. Can give caps or generic 20 tablet 0  . fluticasone (FLONASE) 50 MCG/ACT nasal spray Place 2 sprays into both nostrils daily. 16 g 1  . gabapentin (NEURONTIN) 300 MG capsule Take 1 capsule (300 mg total) by mouth 3 (three) times daily. 90 capsule 3  . hydrOXYzine (VISTARIL) 50 MG capsule Take 1 capsule (50 mg total) by mouth 3 (three) times daily as needed. 30 capsule 0  . JOLESSA 0.15-0.03 MG tablet TAKE ONE TABLET BY MOUTH ONCE DAILY 91 tablet 4  . topiramate (TOPAMAX) 50 MG tablet Take 1 tablet (50 mg total) by mouth at bedtime. 30 tablet 2  . traZODone (DESYREL) 50 MG tablet Take 1 tablet (50 mg total) by mouth at bedtime. 30 tablet 0  . valACYclovir (VALTREX) 500 MG tablet TAKE ONE TABLET BY MOUTH ONCE DAILY 30 tablet 5  . venlafaxine XR (EFFEXOR-XR) 150 MG 24 hr capsule Take 1 capsule (150 mg total) by mouth  at bedtime. 30 capsule 0  . venlafaxine XR (EFFEXOR-XR) 75 MG 24 hr capsule Take 1 capsule (75 mg total) by mouth every morning. 30 capsule 1   No current facility-administered medications on file prior to visit.     Past Surgical History:  Procedure Laterality Date  . ANTERIOR FUSION LUMBAR SPINE  07/06/2017  . BREAST SURGERY    . CESAREAN SECTION  2007 and 2009  . COMBINED ABDOMINOPLASTY AND LIPOSUCTION  12/2014   "liposuction in thighs"  . FRACTURE SURGERY    . LAPAROSCOPIC ABDOMINAL EXPLORATION  1997   "to check for endometriosis"  . LAPAROSCOPIC CHOLECYSTECTOMY  2007  . OPEN REDUCTION INTERNAL FIXATION (ORIF) TIBIA/FIBULA FRACTURE Right 04/08/2015   Procedure: OPEN REDUCTION INTERNAL FIXATION (ORIF)  RIGHT TIBIA PILON FRACTURE;  Surgeon: Wylene Simmer, MD;  Location: [facility];  Service: Orthopedics;  Laterality: Right;  . TIBIA FRACTURE SURGERY Right 04/08/2015   ORIF tibial pilon    Allergies  Allergen Reactions  . Ciprofloxacin Hives  . Lexapro [Escitalopram] Nausea And Vomiting  . Amoxicillin Hives  . Ceclor [Cefaclor] Hives  . Erythromycin Base Rash  . Penicillins Hives  . Phenergan [Promethazine Hcl] Hives  . Sulfa Antibiotics Hives  . Zithromax [Azithromycin] Hives    Social History   Socioeconomic History  . Marital status: Married    Spouse name: Not on file  . Number  of children: Not on file  . Years of education: Not on file  . Highest education level: Not on file  Social Needs  . Financial resource strain: Not on file  . Food insecurity - worry: Not on file  . Food insecurity - inability: Not on file  . Transportation needs - medical: Not on file  . Transportation needs - non-medical: Not on file  Occupational History  . Not on file  Tobacco Use  . Smoking status: Former Smoker    Packs/day: 0.12    Years: 10.00    Pack years: 1.20    Types: Cigarettes    Last attempt to quit: 09/06/2004    Years since quitting: 13.1  . Smokeless tobacco: Never Used   Substance and Sexual Activity  . Alcohol use: Yes    Alcohol/week: 2.4 oz    Types: 4 Cans of beer per week    Comment: 04/08/2015 "drink only the weekends"  . Drug use: No  . Sexual activity: Yes    Comment: vasectomy in partner  Other Topics Concern  . 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        Family History  Problem Relation Age of Onset  . Diabetes Father   . Cancer Sister 30       breast, had chemo/double mastectomy  . Heart disease Maternal Grandfather   . Heart disease Paternal Grandfather     BP 111/63   Pulse 64   Ht 5\' 4"  (1.626 m)   Wt 220 lb (99.8 kg)   BMI 37.76 kg/m   Review of Systems: See HPI above.     Objective:  Physical Exam:  Gen: NAD, comfortable in exam room  Neck: No gross deformity, swelling, bruising. TTP left cervical paraspinal region.  No midline/bony TTP. FROM with pain on extension, left lateral rotation. Strength 3/5 left finger abduction and thumb opposition.  5-/5 elbow extension, 5/5 other muscle groups bilateral upper extremities.   Sensation diminished left index finger.     2+ equal reflexes in triceps, biceps, brachioradialis tendons. Negative spurlings.  Left shoulder: No swelling, ecchymoses.  No gross deformity. No TTP. FROM. Negative Hawkins, Neers. Negative Yergasons. Strength 5/5 with empty can and resisted internal/external rotation. Sensation as noted above. 2+ radial pulses.   Assessment & Plan:  1. Cervical radiculopathy - distribution consistent with C7 radiculopathy though describes some thumb involvement that C6 could be affected too.  With her weakness advised we go ahead with MRI of cervical spine.  She will also start prednisone dose pack with robaxin as needed.  She would also like to do physical therapy.  Discussed  ergonomic issues.    Addendum:  See phone note on 2/25 for full details of MRI results.  Her weakness would be consistent with C8 and/or T1 nerve root irritation which she does not have though sensory complaints and pain would be explained by C6 radiculopathy suggested by MRI.  She is going to try physical therapy, discussed considering ESI.  Would consider neurosurgery referral if not improving despite these.

## 2017-10-21 NOTE — Assessment & Plan Note (Signed)
distribution consistent with C7 radiculopathy though describes some thumb involvement that C6 could be affected too.  With her weakness advised we go ahead with MRI of cervical spine.  She will also start prednisone dose pack with robaxin as needed.  She would also like to do physical therapy.  Discussed ergonomic issues.

## 2017-10-21 NOTE — Patient Instructions (Signed)
You have cervical radiculopathy (a pinched nerve in the neck). Prednisone 6 day dose pack to relieve irritation/inflammation of the nerve. Ok to restart the aleve but wait until the day after you finish the prednisone. Robaxin three times a day as needed for muscle spasms (do not drive with this if it makes you sleepy). Consider cervical collar if severely painful. Simple range of motion exercises within limits of pain to prevent further stiffness. Consider physical therapy for stretching, exercises, traction, and modalities. Heat 15 minutes at a time 3-4 times a day to help with spasms. Watch head position when on computers, texting, when sleeping in bed - should in line with back to prevent further nerve traction and irritation. Consider home traction unit if you get benefit with this in physical therapy. We will try to get an MRI approved since you have 3/5 level strength with finger abduction and thumb opposition Follow up will depend on the MRI results.

## 2017-10-24 DIAGNOSIS — F331 Major depressive disorder, recurrent, moderate: Secondary | ICD-10-CM | POA: Diagnosis not present

## 2017-10-24 DIAGNOSIS — F410 Panic disorder [episodic paroxysmal anxiety] without agoraphobia: Secondary | ICD-10-CM | POA: Diagnosis not present

## 2017-10-27 DIAGNOSIS — M2578 Osteophyte, vertebrae: Secondary | ICD-10-CM | POA: Diagnosis not present

## 2017-10-31 ENCOUNTER — Telehealth: Payer: Self-pay | Admitting: Family Medicine

## 2017-10-31 NOTE — Telephone Encounter (Signed)
Please let her know she has a narrowing at the C5-6 level affected the C6 nerve going into the arm - it's classified as moderate-severe.  I don't think we need to jump to surgery though because the weakness she has on exam should resolve with conservative treatment.  Please ask if the prednisone has helped.  I'd recommend an ESI on the left for C6 radiculopathy if her pain is severe enough to warrant this and/or physical therapy to take some pressure of the nerve.

## 2017-10-31 NOTE — Addendum Note (Signed)
Addended by: Sherrie George F on: 10/31/2017 04:49 PM   Modules accepted: Orders

## 2017-10-31 NOTE — Telephone Encounter (Signed)
Spoke to patient and gave her information provided by the physician. Stated that the prednisone did not help. Patient would like to do physical therapy. Order placed for physical therapy downstairs.

## 2017-10-31 NOTE — Telephone Encounter (Signed)
-----   Message from Renae Fickle sent at 10/31/2017 10:33 AM EST ----- Regarding: Results  Patient returned your call from Friday for results. I told her it probably won't be until Wednesday until you can call her back.

## 2017-11-01 ENCOUNTER — Encounter: Payer: Self-pay | Admitting: Family Medicine

## 2017-11-01 NOTE — Addendum Note (Signed)
Addended by: Sherrie George F on: 11/01/2017 02:41 PM   Modules accepted: Orders

## 2017-11-04 ENCOUNTER — Emergency Department (HOSPITAL_COMMUNITY)
Admission: EM | Admit: 2017-11-04 | Discharge: 2017-11-04 | Disposition: A | Discharge status: Other Acute Inpatient Hospital | Payer: BLUE CROSS/BLUE SHIELD | Attending: Emergency Medicine | Admitting: Emergency Medicine

## 2017-11-04 ENCOUNTER — Inpatient Hospital Stay (HOSPITAL_COMMUNITY)
Admission: AD | Admit: 2017-11-04 | Discharge: 2017-11-11 | DRG: 885 | Disposition: A | Discharge status: Home / Self Care | Payer: BLUE CROSS/BLUE SHIELD | Source: Intra-hospital | Attending: Psychiatry | Admitting: Psychiatry

## 2017-11-04 ENCOUNTER — Encounter (HOSPITAL_COMMUNITY): Payer: Self-pay

## 2017-11-04 ENCOUNTER — Other Ambulatory Visit: Payer: Self-pay

## 2017-11-04 ENCOUNTER — Encounter (HOSPITAL_COMMUNITY): Payer: Self-pay | Admitting: *Deleted

## 2017-11-04 DIAGNOSIS — Z888 Allergy status to other drugs, medicaments and biological substances status: Secondary | ICD-10-CM

## 2017-11-04 DIAGNOSIS — G8929 Other chronic pain: Secondary | ICD-10-CM | POA: Diagnosis present

## 2017-11-04 DIAGNOSIS — F19939 Other psychoactive substance use, unspecified with withdrawal, unspecified: Secondary | ICD-10-CM | POA: Diagnosis not present

## 2017-11-04 DIAGNOSIS — Z79899 Other long term (current) drug therapy: Secondary | ICD-10-CM | POA: Insufficient documentation

## 2017-11-04 DIAGNOSIS — Z818 Family history of other mental and behavioral disorders: Secondary | ICD-10-CM

## 2017-11-04 DIAGNOSIS — F411 Generalized anxiety disorder: Secondary | ICD-10-CM | POA: Diagnosis not present

## 2017-11-04 DIAGNOSIS — R4689 Other symptoms and signs involving appearance and behavior: Secondary | ICD-10-CM | POA: Diagnosis not present

## 2017-11-04 DIAGNOSIS — Z880 Allergy status to penicillin: Secondary | ICD-10-CM | POA: Diagnosis not present

## 2017-11-04 DIAGNOSIS — F1099 Alcohol use, unspecified with unspecified alcohol-induced disorder: Secondary | ICD-10-CM | POA: Diagnosis not present

## 2017-11-04 DIAGNOSIS — Z881 Allergy status to other antibiotic agents status: Secondary | ICD-10-CM | POA: Diagnosis not present

## 2017-11-04 DIAGNOSIS — M545 Low back pain: Secondary | ICD-10-CM | POA: Diagnosis not present

## 2017-11-04 DIAGNOSIS — R451 Restlessness and agitation: Secondary | ICD-10-CM | POA: Diagnosis not present

## 2017-11-04 DIAGNOSIS — Z915 Personal history of self-harm: Secondary | ICD-10-CM

## 2017-11-04 DIAGNOSIS — R110 Nausea: Secondary | ICD-10-CM | POA: Insufficient documentation

## 2017-11-04 DIAGNOSIS — F329 Major depressive disorder, single episode, unspecified: Secondary | ICD-10-CM | POA: Diagnosis present

## 2017-11-04 DIAGNOSIS — E669 Obesity, unspecified: Secondary | ICD-10-CM | POA: Diagnosis present

## 2017-11-04 DIAGNOSIS — F39 Unspecified mood [affective] disorder: Secondary | ICD-10-CM | POA: Diagnosis not present

## 2017-11-04 DIAGNOSIS — F419 Anxiety disorder, unspecified: Secondary | ICD-10-CM | POA: Diagnosis not present

## 2017-11-04 DIAGNOSIS — Z981 Arthrodesis status: Secondary | ICD-10-CM | POA: Diagnosis not present

## 2017-11-04 DIAGNOSIS — R51 Headache: Secondary | ICD-10-CM | POA: Diagnosis present

## 2017-11-04 DIAGNOSIS — F1290 Cannabis use, unspecified, uncomplicated: Secondary | ICD-10-CM | POA: Diagnosis not present

## 2017-11-04 DIAGNOSIS — F319 Bipolar disorder, unspecified: Principal | ICD-10-CM | POA: Diagnosis present

## 2017-11-04 DIAGNOSIS — F430 Acute stress reaction: Secondary | ICD-10-CM | POA: Diagnosis not present

## 2017-11-04 DIAGNOSIS — M542 Cervicalgia: Secondary | ICD-10-CM | POA: Insufficient documentation

## 2017-11-04 DIAGNOSIS — Z882 Allergy status to sulfonamides status: Secondary | ICD-10-CM | POA: Diagnosis not present

## 2017-11-04 DIAGNOSIS — G4700 Insomnia, unspecified: Secondary | ICD-10-CM | POA: Diagnosis present

## 2017-11-04 DIAGNOSIS — F1390 Sedative, hypnotic, or anxiolytic use, unspecified, uncomplicated: Secondary | ICD-10-CM | POA: Diagnosis not present

## 2017-11-04 DIAGNOSIS — F312 Bipolar disorder, current episode manic severe with psychotic features: Secondary | ICD-10-CM | POA: Diagnosis not present

## 2017-11-04 DIAGNOSIS — Z87891 Personal history of nicotine dependence: Secondary | ICD-10-CM | POA: Diagnosis not present

## 2017-11-04 DIAGNOSIS — Y900 Blood alcohol level of less than 20 mg/100 ml: Secondary | ICD-10-CM | POA: Diagnosis not present

## 2017-11-04 DIAGNOSIS — M1990 Unspecified osteoarthritis, unspecified site: Secondary | ICD-10-CM | POA: Diagnosis not present

## 2017-11-04 DIAGNOSIS — R45850 Homicidal ideations: Secondary | ICD-10-CM | POA: Diagnosis not present

## 2017-11-04 DIAGNOSIS — Z6837 Body mass index (BMI) 37.0-37.9, adult: Secondary | ICD-10-CM | POA: Diagnosis not present

## 2017-11-04 LAB — COMPREHENSIVE METABOLIC PANEL
ALBUMIN: 3.8 g/dL (ref 3.5–5.0)
ALT: 24 U/L (ref 14–54)
ANION GAP: 11 (ref 5–15)
AST: 35 U/L (ref 15–41)
Alkaline Phosphatase: 64 U/L (ref 38–126)
BUN: 13 mg/dL (ref 6–20)
CHLORIDE: 107 mmol/L (ref 101–111)
CO2: 20 mmol/L — AB (ref 22–32)
Calcium: 8.8 mg/dL — ABNORMAL LOW (ref 8.9–10.3)
Creatinine, Ser: 0.99 mg/dL (ref 0.44–1.00)
GFR calc non Af Amer: >60 mL/min (ref >60)
GLUCOSE: 121 mg/dL — AB (ref 65–99)
POTASSIUM: 4.1 mmol/L (ref 3.5–5.1)
SODIUM: 138 mmol/L (ref 135–145)
Total Bilirubin: 1.1 mg/dL (ref 0.3–1.2)
Total Protein: 7.2 g/dL (ref 6.5–8.1)

## 2017-11-04 LAB — ACETAMINOPHEN LEVEL

## 2017-11-04 LAB — CBC
HEMATOCRIT: 44.8 % (ref 36.0–46.0)
HEMOGLOBIN: 14.9 g/dL (ref 12.0–15.0)
MCH: 29.6 pg (ref 26.0–34.0)
MCHC: 33.3 g/dL (ref 30.0–36.0)
MCV: 88.9 fL (ref 78.0–100.0)
Platelets: 268 10*3/uL (ref 150–400)
RBC: 5.04 MIL/uL (ref 3.87–5.11)
RDW: 13 % (ref 11.5–15.5)
WBC: 10.1 10*3/uL (ref 4.0–10.5)

## 2017-11-04 LAB — I-STAT BETA HCG BLOOD, ED (MC, WL, AP ONLY): I-stat hCG, quantitative: <5 m[IU]/mL (ref <5)

## 2017-11-04 LAB — RAPID URINE DRUG SCREEN, HOSP PERFORMED
Amphetamines: NOT DETECTED
BENZODIAZEPINES: POSITIVE — AB
Barbiturates: NOT DETECTED
COCAINE: NOT DETECTED
OPIATES: NOT DETECTED
TETRAHYDROCANNABINOL: POSITIVE — AB

## 2017-11-04 LAB — ETHANOL: Alcohol, Ethyl (B): <10 mg/dL (ref <10)

## 2017-11-04 LAB — SALICYLATE LEVEL

## 2017-11-04 MED ORDER — VALACYCLOVIR HCL 500 MG PO TABS
500.0000 mg | ORAL_TABLET | Freq: Every day | ORAL | Status: DC
  Administered 2017-11-05 – 2017-11-08 (×4): 500 mg via ORAL
  Filled 2017-11-04 (×6): qty 1

## 2017-11-04 MED ORDER — VENLAFAXINE HCL ER 75 MG PO CP24: 150.0000 mg | ORAL_CAPSULE | Freq: Every day | ORAL | Status: DC

## 2017-11-04 MED ORDER — METHOCARBAMOL 500 MG PO TABS: 750.0000 mg | ORAL_TABLET | Freq: Two times a day (BID) | ORAL | Status: DC | PRN

## 2017-11-04 MED ORDER — DIPHENHYDRAMINE HCL 25 MG PO CAPS: 25.0000 mg | ORAL_CAPSULE | Freq: Two times a day (BID) | ORAL | Status: DC | PRN

## 2017-11-04 MED ORDER — TRAZODONE HCL 50 MG PO TABS
50.0000 mg | ORAL_TABLET | Freq: Every day | ORAL | Status: DC
  Administered 2017-11-04 – 2017-11-09 (×3): 50 mg via ORAL
  Filled 2017-11-04 (×10): qty 1

## 2017-11-04 MED ORDER — MAGNESIUM HYDROXIDE 400 MG/5ML PO SUSP: 30.0000 mL | Freq: Every day | ORAL | Status: DC | PRN

## 2017-11-04 MED ORDER — METHOCARBAMOL 750 MG PO TABS
750.0000 mg | ORAL_TABLET | Freq: Two times a day (BID) | ORAL | Status: DC | PRN
  Administered 2017-11-04 – 2017-11-10 (×6): 750 mg via ORAL
  Filled 2017-11-04 (×6): qty 1

## 2017-11-04 MED ORDER — HYDROXYZINE PAMOATE 50 MG PO CAPS
50.0000 mg | ORAL_CAPSULE | Freq: Three times a day (TID) | ORAL | Status: DC | PRN
  Filled 2017-11-04: qty 1

## 2017-11-04 MED ORDER — HYDROXYZINE HCL 25 MG PO TABS
50.0000 mg | ORAL_TABLET | Freq: Three times a day (TID) | ORAL | Status: DC | PRN
  Administered 2017-11-06 – 2017-11-07 (×2): 50 mg via ORAL
  Filled 2017-11-04 (×2): qty 2

## 2017-11-04 MED ORDER — VENLAFAXINE HCL ER 75 MG PO CP24: 75.0000 mg | ORAL_CAPSULE | Freq: Every morning | ORAL | Status: DC

## 2017-11-04 MED ORDER — NAPROXEN 500 MG PO TABS: 500.0000 mg | ORAL_TABLET | Freq: Two times a day (BID) | ORAL | Status: DC | PRN

## 2017-11-04 MED ORDER — GABAPENTIN 300 MG PO CAPS
300.0000 mg | ORAL_CAPSULE | Freq: Three times a day (TID) | ORAL | Status: DC
  Administered 2017-11-04: 300 mg via ORAL
  Filled 2017-11-04: qty 1

## 2017-11-04 MED ORDER — NAPROXEN 500 MG PO TABS
500.0000 mg | ORAL_TABLET | Freq: Two times a day (BID) | ORAL | Status: DC | PRN
  Administered 2017-11-07 – 2017-11-10 (×3): 500 mg via ORAL
  Filled 2017-11-04 (×3): qty 1

## 2017-11-04 MED ORDER — METOCLOPRAMIDE HCL 10 MG PO TABS: 10.0000 mg | ORAL_TABLET | Freq: Two times a day (BID) | ORAL | Status: DC | PRN

## 2017-11-04 MED ORDER — LEVONORGEST-ETH ESTRAD 91-DAY 0.15-0.03 MG PO TABS
1.0000 | ORAL_TABLET | Freq: Every day | ORAL | Status: DC
  Administered 2017-11-04 – 2017-11-10 (×7): 1 via ORAL
  Filled 2017-11-04 (×6): qty 1

## 2017-11-04 MED ORDER — TOPIRAMATE 25 MG PO TABS
50.0000 mg | ORAL_TABLET | Freq: Every day | ORAL | Status: DC
Start: 2017-11-04 — End: 2017-11-11
  Administered 2017-11-04 – 2017-11-10 (×7): 50 mg via ORAL
  Filled 2017-11-04 (×9): qty 2

## 2017-11-04 MED ORDER — ALUM & MAG HYDROXIDE-SIMETH 200-200-20 MG/5ML PO SUSP
30.0000 mL | ORAL | Status: DC | PRN
  Administered 2017-11-08: 30 mL via ORAL
  Filled 2017-11-04: qty 30

## 2017-11-04 MED ORDER — GABAPENTIN 300 MG PO CAPS
300.0000 mg | ORAL_CAPSULE | Freq: Three times a day (TID) | ORAL | Status: DC
  Administered 2017-11-05 – 2017-11-11 (×19): 300 mg via ORAL
  Filled 2017-11-04 (×22): qty 1

## 2017-11-04 MED ORDER — NAPROXEN SODIUM 220 MG PO TABS: 500.0000 mg | ORAL_TABLET | Freq: Two times a day (BID) | ORAL | Status: DC | PRN

## 2017-11-04 MED ORDER — TRAZODONE HCL 50 MG PO TABS: 50.0000 mg | ORAL_TABLET | Freq: Every day | ORAL | Status: DC

## 2017-11-04 MED ORDER — ACETAMINOPHEN 325 MG PO TABS: 650.0000 mg | ORAL_TABLET | Freq: Four times a day (QID) | ORAL | Status: DC | PRN

## 2017-11-04 MED ORDER — LEVONORGEST-ETH ESTRAD 91-DAY 0.15-0.03 MG PO TABS: 1.0000 | ORAL_TABLET | Freq: Every day | ORAL | Status: DC

## 2017-11-04 MED ORDER — VALACYCLOVIR HCL 500 MG PO TABS: 500.0000 mg | ORAL_TABLET | Freq: Every day | ORAL | Status: DC

## 2017-11-04 MED ORDER — VENLAFAXINE HCL ER 150 MG PO CP24
150.0000 mg | ORAL_CAPSULE | Freq: Every day | ORAL | Status: DC
  Administered 2017-11-04: 150 mg via ORAL
  Filled 2017-11-04 (×3): qty 1
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
## 2017-11-04 NOTE — ED Notes (Addendum)
Report called to [facility], also call to husband at [phone] with updated to room number and visiting hours given.

## 2017-11-04 NOTE — ED Notes (Addendum)
Pt has one patient belonging bag in locker # 28 (inluding small bag with patient's jewelry: earrings, necklace, rings and pt's cell phone)

## 2017-11-04 NOTE — Progress Notes (Signed)
Gina James is a 37 year old female pt admitted on voluntary basis. On admission, Gina James presents as anxious and spoke about how she has not been feeling well recently. She spoke about how she has been feeling suicidal and homicidal recently but is able to contract for safety while in the hospital. She spoke about a recent medication change that has contributed to her not feeling well and also spoke about how her husband is a trigger for her. She spoke about how she has been depressed for her entire life and spoke about how she has been on medications for a very long time. She reports that Dr. Toy Care wanted to make the medication change. She reports that she will smoke some marijuana and denies any other substance abuse issues. She reports that she lives with her husband and 2 children and reports that she will go back there once she is discharged. Gina James was oriented to the unit and safety maintained.

## 2017-11-04 NOTE — Tx Team (Signed)
Initial Treatment Plan 11/04/2017 11:30 PM Gina James RRN:[id number]    PATIENT STRESSORS: Marital or family conflict Medication change or noncompliance   PATIENT STRENGTHS: Ability for insight Average or above average intelligence Capable of independent living Communication skills General fund of knowledge Motivation for treatment/growth   PATIENT IDENTIFIED PROBLEMS: Depression Anxiety Suicidal thoughts "I don't want to feel depressed and crazy"                     DISCHARGE CRITERIA:  Ability to meet basic life and health needs Improved stabilization in mood, thinking, and/or behavior Reduction of life-threatening or endangering symptoms to within safe limits Verbal commitment to aftercare and medication compliance  PRELIMINARY DISCHARGE PLAN: Attend aftercare/continuing care group Return to previous living arrangement  PATIENT/FAMILY INVOLVEMENT: This treatment plan has been presented to and reviewed with the patient, Gina James, and/or family member, .  The patient and family have been given the opportunity to ask questions and make suggestions.  Daman Steffenhagen, [place], [place] 11/04/2017, 11:30 PM

## 2017-11-04 NOTE — ED Notes (Signed)
ED TO INPATIENT HANDOFF REPORT  Name/Age/Gender Gina James 37 y.o. female  Code Status    Code Status Orders  (From admission, onward)        Start     Ordered   11/04/17 1739  Full code  Continuous     11/04/17 1738    Code Status History    Date Active Date Inactive Code Status Order ID Comments User Context   08/26/2017 11:13 08/26/2017 18:11 Full Code [id number]  Blanchie Dessert, MD ED   04/08/2015 19:56 04/10/2015 15:52 Full Code [id number]  Corky Sing, PA-C Inpatient      Home/SNF/Other Skilled nursing facility  Chief Complaint mental health issue  Level of Care/Admitting Diagnosis ED Disposition    None      Medical History Past Medical History:  Diagnosis Date  . Ankle fracture, right   . Anxiety   . Arthritis    "in my back" (04/08/2015)  . Chronic lower back pain   . Complication of anesthesia    difficulty urinating after anesthesia  . Headache    "monthly" (04/08/2015)  . Post partum depression     Allergies Allergies  Allergen Reactions  . Ciprofloxacin Hives  . Lexapro [Escitalopram] Nausea And Vomiting  . Amoxicillin Hives and Other (See Comments)    Has patient had a PCN reaction causing immediate rash, facial/tongue/throat swelling, SOB or lightheadedness with hypotension: no Has patient had a PCN reaction causing severe rash involving mucus membranes or skin necrosis: no Has patient had a PCN reaction that required hospitalization: no Has patient had a PCN reaction occurring within the last 10 years: no - childhood reaction If all of the above answers are "NO", then may proceed with Cephalosporin use.   . Ceclor [Cefaclor] Hives  . Erythromycin Base Rash  . Penicillins Hives and Other (See Comments)    Has patient had a PCN reaction causing immediate rash, facial/tongue/throat swelling, SOB or lightheadedness with hypotension: no Has patient had a PCN reaction causing severe rash involving mucus membranes or skin necrosis:  no Has patient had a PCN reaction that required hospitalization: no Has patient had a PCN reaction occurring within the last 10 years: no - childhood reaction If all of the above answers are "NO", then may proceed with Cephalosporin use.  Marland Kitchen Phenergan [Promethazine Hcl] Hives  . Sulfa Antibiotics Hives  . Zithromax [Azithromycin] Hives    IV Location/Drains/Wounds Patient Lines/Drains/Airways Status   Active Line/Drains/Airways    None          Labs/Imaging Results for orders placed or performed during the hospital encounter of 11/04/17 (from the past 48 hour(s))  Comprehensive metabolic panel     Status: Abnormal   Collection Time: 11/04/17  2:26 PM  Result Value Ref Range   Sodium 138 135 - 145 mmol/L   Potassium 4.1 3.5 - 5.1 mmol/L   Chloride 107 101 - 111 mmol/L   CO2 20 (L) 22 - 32 mmol/L   Glucose, Bld 121 (H) 65 - 99 mg/dL   BUN 13 6 - 20 mg/dL   Creatinine, Ser 0.99 0.44 - 1.00 mg/dL   Calcium 8.8 (L) 8.9 - 10.3 mg/dL   Total Protein 7.2 6.5 - 8.1 g/dL   Albumin 3.8 3.5 - 5.0 g/dL   AST 35 15 - 41 U/L   ALT 24 14 - 54 U/L   Alkaline Phosphatase 64 38 - 126 U/L   Total Bilirubin 1.1 0.3 - 1.2 mg/dL   GFR calc  non Af Amer >60 >60 mL/min   GFR calc Af Amer >60 >60 mL/min    Comment: (NOTE) The eGFR has been calculated using the CKD EPI equation. This calculation has not been validated in all clinical situations. eGFR's persistently <60 mL/min signify possible Chronic Kidney Disease.    Anion gap 11 5 - 15    Comment: Performed at [facility], [place] [address], [place], [place] [address]  Ethanol     Status: None   Collection Time: 11/04/17  2:26 PM  Result Value Ref Range   Alcohol, Ethyl (B) <10 <10 mg/dL    Comment:        LOWEST DETECTABLE LIMIT FOR SERUM ALCOHOL IS 10 mg/dL FOR MEDICAL PURPOSES ONLY Performed at [facility], [place] [address], [place], [place] [address]   Salicylate level     Status: None    Collection Time: 11/04/17  2:26 PM  Result Value Ref Range   Salicylate Lvl <6.4 2.8 - 30.0 mg/dL    Comment: Performed at [facility], [place] [address] [place], [place] [address]  Acetaminophen level     Status: Abnormal   Collection Time: 11/04/17  2:26 PM  Result Value Ref Range   Acetaminophen (Tylenol), Serum <10 (L) 10 - 30 ug/mL    Comment:        THERAPEUTIC CONCENTRATIONS VARY SIGNIFICANTLY. A RANGE OF 10-30 ug/mL MAY BE AN EFFECTIVE CONCENTRATION FOR MANY PATIENTS. HOWEVER, SOME ARE BEST TREATED AT CONCENTRATIONS OUTSIDE THIS RANGE. ACETAMINOPHEN CONCENTRATIONS >150 ug/mL AT 4 HOURS AFTER INGESTION AND >50 ug/mL AT 12 HOURS AFTER INGESTION ARE OFTEN ASSOCIATED WITH TOXIC REACTIONS. Performed at [facility], [place] [address], [place], [place] [address]   cbc     Status: None   Collection Time: 11/04/17  2:26 PM  Result Value Ref Range   WBC 10.1 4.0 - 10.5 K/uL   RBC 5.04 3.87 - 5.11 MIL/uL   Hemoglobin 14.9 12.0 - 15.0 g/dL   HCT 44.8 36.0 - 46.0 %   MCV 88.9 78.0 - 100.0 fL   MCH 29.6 26.0 - 34.0 pg   MCHC 33.3 30.0 - 36.0 g/dL   RDW 13.0 11.5 - 15.5 %   Platelets 268 150 - 400 K/uL    Comment: Performed at [facility], [place] [address] [place], [place] [address]  I-Stat beta hCG blood, ED     Status: None   Collection Time: 11/04/17  2:38 PM  Result Value Ref Range   I-stat hCG, quantitative <5.0 <5 mIU/mL   Comment 3            Comment:   GEST. AGE      CONC.  (mIU/mL)   <=1 WEEK        5 - 50     2 WEEKS       50 - 500     3 WEEKS       100 - 10,000     4 WEEKS     1,000 - 30,000        FEMALE AND NON-PREGNANT FEMALE:     LESS THAN 5 mIU/mL   Rapid urine drug screen (hospital performed)     Status: Abnormal   Collection Time: 11/04/17  4:15 PM  Result Value Ref Range   Opiates NONE DETECTED NONE DETECTED   Cocaine NONE DETECTED NONE DETECTED   Benzodiazepines POSITIVE (A) NONE DETECTED    Amphetamines NONE DETECTED  NONE DETECTED   Tetrahydrocannabinol POSITIVE (A) NONE DETECTED   Barbiturates NONE DETECTED NONE DETECTED    Comment: (NOTE) DRUG SCREEN FOR MEDICAL PURPOSES ONLY.  IF CONFIRMATION IS NEEDED FOR ANY PURPOSE, NOTIFY LAB WITHIN 5 DAYS. LOWEST DETECTABLE LIMITS FOR URINE DRUG SCREEN Drug Class                     Cutoff (ng/mL) Amphetamine and metabolites    1000 Barbiturate and metabolites    200 Benzodiazepine                 619 Tricyclics and metabolites     300 Opiates and metabolites        300 Cocaine and metabolites        300 THC                            50 Performed at [facility], [place] [address] [place], [place] [address]    No results found.  Pending Labs Unresulted Labs (From admission, onward)   None      Vitals/Pain Today's Vitals   11/04/17 1343 11/04/17 1352 11/04/17 2112 11/04/17 2136  BP: 123/84  119/76   Pulse: (!) 102  81   Resp: 15  20   Temp: 98.9 F (37.2 C)  98.4 F (36.9 C)   TempSrc: Oral  Oral   SpO2: 98%  98%   PainSc:  0-No pain  0-No pain    Isolation Precautions No active isolations  Medications Medications  gabapentin (NEURONTIN) capsule 300 mg (300 mg Oral Given 11/04/17 1625)  hydrOXYzine (VISTARIL) capsule 50 mg (not administered)  levonorgestrel-ethinyl estradiol (SEASONALE,INTROVALE,JOLESSA) 0.15-0.03 MG per tablet 1 tablet (not administered)  methocarbamol (ROBAXIN) tablet 750 mg (not administered)  traZODone (DESYREL) tablet 50 mg (not administered)  valACYclovir (VALTREX) tablet 500 mg (not administered)  venlafaxine XR (EFFEXOR-XR) 24 hr capsule 150 mg (not administered)  venlafaxine XR (EFFEXOR-XR) 24 hr capsule 75 mg (not administered)  metoCLOPramide (REGLAN) tablet 10 mg (not administered)  diphenhydrAMINE (BENADRYL) capsule 25 mg (not administered)  naproxen (NAPROSYN) tablet 500 mg (not administered)    Mobility walks

## 2017-11-04 NOTE — ED Triage Notes (Signed)
Pt reports SI/HI since switching her Effexor to Trintellix. She started the medication last Monday. Her psychiatrist recommended that she come in for evaluation. She does not have a plan to hurt herself and states that she doesn't want to kill anyone she knows, but thinks about just killing random people while driving. She also reports delusions and paranoia. A&Ox4. She reports good support systems.

## 2017-11-04 NOTE — Progress Notes (Signed)
Patient accepted to [facility], room 400-1. Please call report to [phone]. Admit to the service of Dr. Parke Poisson, MD.  Room ready at 9:30 p.m. Clayborne Dana, RN

## 2017-11-04 NOTE — BH Assessment (Signed)
[place] Assessment Progress Note  Case was staffed with Reita Cliche DNP who recommended a inpatient admission appropriate bed placement is investigated.

## 2017-11-04 NOTE — ED Notes (Signed)
TTS at bedside. 

## 2017-11-04 NOTE — Progress Notes (Signed)
VOL paperwork completed and faxed to [facility].  Lind Covert, MSW, LCSW Therapeutic Triage Specialist  [phone]

## 2017-11-04 NOTE — BH Assessment (Addendum)
Assessment Note  Gina James is an 37 y.o. female that presents this date with S/I and H/I. Patient states she is having thoughts of self harm with intent but no plan. Patient states her anxiety has worsened to the point that she "feels like killing everyone" but has no plan or intent to harm others. Patient is observed to be very liable as she interacts with this Probation officer. Patient is tearful and speaks with a rapid pressured voice. Patient is noted to be having fine hand tremors as she interacts with this Probation officer. Patient states she has been experiencing the tremors the last two days but have noticed they appear when she feels "overwhelmed." Patient denies any plan to harm herself but states "she doesn't feel safe." Patient expresses concerns associated with some paranoia but is unable to elaborate on content. Patient also stated that she has been diagnosed with depression which she feels, is associated with her inability to "deal with the anxiety." Patient reports ongoing depression with symptoms to include: feeling worthless, guilt and isolating. Patient also reports feelings of being paranoid and feels "everyone is out to get her." Patient states at times, she is delusional being so overwhelmed by these ongoing thoughts that she is unsure of "what reality really is."  Patient states she feels, her paranoia is related to her anxiety. Patient denies any prior inpatient admissions to assist with any MH issues. Patient denies any past/present history of abuse but does report one attempt at self harm "years ago" but is vague in reference to time frame or content of that event. Patient is oriented to time/place and denies any legal. Patient is observed to be circumstantial and has to be redirected to gather a active history. Patient was last seen at [facility] on 08/26/17 presenting that date with similar symptoms associated with GAD but did not meet inpatient criteria at that time for a admission and was recommended to  follow up with her OP provider [facility] Care MD) which she continues to see for assistance with medication management. Patient states she been under the care of  Toy Care MD who assists with medication management. Patient reports a recent medication change (Effexor to Trintellix) last Monday 10/31/17 and feels that has increased her symptoms associated with anxiety.  Patient admits to periodic Cannabis and alcohol use reporting she uses 1 gram two to three times a week to assist with anxiety. Patient states she "has a glass of wine once in a while" but cannot recall last use. Patient denies any other SA use and reports her last Cannabis use was on 10/31/17 when she used 1/2 a gram. Per notes this date, patient reports SI/HI since switching her Effexor to Trintellix. She started the medication last Monday. Her psychiatrist recommended that she come in for evaluation. She does not have a plan to hurt herself and states that she doesn't want to kill anyone she knows, but thinks about just killing random people while driving. She also reports delusions and paranoia. Case was staffed with Reita Cliche DNP who recommended a inpatient admission appropriate bed placement is investigated.    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 Ankle fracture, right   . Anxiety   . Arthritis    "in my back" (04/08/2015)  . Chronic lower back pain   . Complication of anesthesia    difficulty urinating after anesthesia  . Headache    "monthly" (04/08/2015)  .  Post partum depression     Past Surgical History:  Procedure Laterality Date  . ANTERIOR FUSION LUMBAR SPINE  07/06/2017  . BREAST SURGERY    . CESAREAN SECTION  2007 and 2009  . COMBINED ABDOMINOPLASTY AND LIPOSUCTION  12/2014   "liposuction in thighs"  . FRACTURE SURGERY    . LAPAROSCOPIC ABDOMINAL EXPLORATION  1997   "to check for endometriosis"  . LAPAROSCOPIC  CHOLECYSTECTOMY  2007  . OPEN REDUCTION INTERNAL FIXATION (ORIF) TIBIA/FIBULA FRACTURE Right 04/08/2015   Procedure: OPEN REDUCTION INTERNAL FIXATION (ORIF)  RIGHT TIBIA PILON FRACTURE;  Surgeon: Wylene Simmer, MD;  Location: [facility];  Service: Orthopedics;  Laterality: Right;  . TIBIA FRACTURE SURGERY Right 04/08/2015   ORIF tibial pilon    Family History:  Family History  Problem Relation Age of Onset  . Diabetes Father   . Cancer Sister 30       breast, had chemo/double mastectomy  . Heart disease Maternal Grandfather   . Heart disease Paternal Grandfather     Social History:  reports that she quit smoking about 13 years ago. Her smoking use included cigarettes. She has a 1.20 pack-year smoking history. she has never used smokeless tobacco. She reports that she drinks about 2.4 oz of alcohol per week. She reports that she does not use drugs.  Additional Social History:  Alcohol / Drug Use Pain Medications: See MAR Prescriptions: See MAR Over the Counter: See MAR History of alcohol / drug use?: Yes Longest period of sobriety (when/how long): Unknown Negative Consequences of Use: (Denies) Withdrawal Symptoms: (Denies) Substance #1 Name of Substance 1: Cannabis 1 - Age of First Use: 17 1 - Amount (size/oz): 1 gram  1 - Frequency: Three to four times a week 1 - Duration: Last year 1 - Last Use / Amount: 10/31/17 1 gram  and wine  CIWA: CIWA-Ar BP: 123/84 Pulse Rate: (!) 102 COWS:    Allergies:  Allergies  Allergen Reactions  . Ciprofloxacin Hives  . Lexapro [Escitalopram] Nausea And Vomiting  . Amoxicillin Hives and Other (See Comments)    Has patient had a PCN reaction causing immediate rash, facial/tongue/throat swelling, SOB or lightheadedness with hypotension: no Has patient had a PCN reaction causing severe rash involving mucus membranes or skin necrosis: no Has patient had a PCN reaction that required hospitalization: no Has patient had a PCN reaction occurring within the  last 10 years: no - childhood reaction If all of the above answers are "NO", then may proceed with Cephalosporin use.   . Ceclor [Cefaclor] Hives  . Erythromycin Base Rash  . Penicillins Hives and Other (See Comments)    Has patient had a PCN reaction causing immediate rash, facial/tongue/throat swelling, SOB or lightheadedness with hypotension: no Has patient had a PCN reaction causing severe rash involving mucus membranes or skin necrosis: no Has patient had a PCN reaction that required hospitalization: no Has patient had a PCN reaction occurring within the last 10 years: no - childhood reaction If all of the above answers are "NO", then may proceed with Cephalosporin use.  Marland Kitchen Phenergan [Promethazine Hcl] Hives  . Sulfa Antibiotics Hives  . Zithromax [Azithromycin] Hives    Home Medications:  (Not in a hospital admission)  OB/GYN Status:  No LMP recorded. Patient is not currently having periods (Reason: Oral contraceptives).  General Assessment Data Location of Assessment: [facility] TTS Assessment: In system Is this a Tele or Face-to-Face Assessment?: Face-to-Face Is this an Initial Assessment or a Re-assessment for  this encounter?: Initial Assessment Marital status: Married Wright name: NA Is patient pregnant?: No Pregnancy Status: No Living Arrangements: Spouse/significant other Can pt return to current living arrangement?: Yes Admission Status: Voluntary Is patient capable of signing voluntary admission?: Yes Referral Source: Self/Family/Friend Insurance type: Systems developer Exam ([place]) Medical Exam completed: Yes  Crisis Care Plan Living Arrangements: Spouse/significant other Legal Guardian: (NA) Name of Psychiatrist: [facility] MD Name of Therapist: None  Education Status Is patient currently in school?: No Current Grade: (NA) Highest grade of school patient has completed: (Some college) Name of school: (NA) Contact person: (NA)  Risk to self  with the past 6 months Suicidal Ideation: Yes-Currently Present Has patient been a risk to self within the past 6 months prior to admission? : No Suicidal Intent: Yes-Currently Present Has patient had any suicidal intent within the past 6 months prior to admission? : No Is patient at risk for suicide?: Yes Suicidal Plan?: No Has patient had any suicidal plan within the past 6 months prior to admission? : No Access to Means: No What has been your use of drugs/alcohol within the last 12 months?: Current use Previous Attempts/Gestures: Yes How many times?: 1 Other Self Harm Risks: NA Triggers for Past Attempts: Other (Comment)(Anxiety) Intentional Self Injurious Behavior: None Family Suicide History: No Recent stressful life event(s): Other (Comment)(Recent medication changes) Persecutory voices/beliefs?: Yes Depression: Yes Depression Symptoms: Feeling worthless/self pity Substance abuse history and/or treatment for substance abuse?: No Suicide prevention information given to non-admitted patients: Not applicable  Risk to Others within the past 6 months Homicidal Ideation: Yes-Currently Present Does patient have any lifetime risk of violence toward others beyond the six months prior to admission? : No Thoughts of Harm to Others: Yes-Currently Present Comment - Thoughts of Harm to Others: Pt states "she wants to hurt people out of anger" Current Homicidal Intent: No Current Homicidal Plan: No Access to Homicidal Means: No Identified Victim: "Other people" History of harm to others?: No Assessment of Violence: None Noted Violent Behavior Description: (NA) Does patient have access to weapons?: No Criminal Charges Pending?: No Does patient have a court date: No Is patient on probation?: No  Psychosis Hallucinations: None noted Delusions: Persecutory  Mental Status Report Appearance/Hygiene: In scrubs Eye Contact: Good Motor Activity: Freedom of movement Speech: Rapid,  Pressured Level of Consciousness: Alert Mood: Anxious Affect: Labile Anxiety Level: Severe Thought Processes: Coherent, Relevant Judgement: Unimpaired Orientation: Person, Place, Time Obsessive Compulsive Thoughts/Behaviors: None  Cognitive Functioning Concentration: Normal Memory: Recent Intact, Remote Intact IQ: Average Insight: Good Impulse Control: Fair Appetite: Fair Weight Loss: 0 Weight Gain: 0 Sleep: Decreased Total Hours of Sleep: 4 Vegetative Symptoms: None  ADLScreening [facility] Assessment Services) Patient's cognitive ability adequate to safely complete daily activities?: Yes Patient able to express need for assistance with ADLs?: Yes Independently performs ADLs?: Yes (appropriate for developmental age)  Prior Inpatient Therapy Prior Inpatient Therapy: No Prior Therapy Dates: NA Prior Therapy Facilty/Provider(s): NA Reason for Treatment: NA  Prior Outpatient Therapy Prior Outpatient Therapy: Yes Prior Therapy Dates: 2018 Prior Therapy Facilty/Provider(s): [facility] MD Reason for Treatment: Med mang Does patient have an ACCT team?: No Does patient have Intensive In-House Services?  : No Does patient have [facility] services? : No Does patient have P4CC services?: No  ADL Screening (condition at time of admission) Patient's cognitive ability adequate to safely complete daily activities?: Yes Is the patient deaf or have difficulty hearing?: No Does the patient have difficulty seeing, even when wearing  glasses/contacts?: No Does the patient have difficulty concentrating, remembering, or making decisions?: No Patient able to express need for assistance with ADLs?: Yes Does the patient have difficulty dressing or bathing?: No Independently performs ADLs?: Yes (appropriate for developmental age) Does the patient have difficulty walking or climbing stairs?: No Weakness of Legs: None Weakness of Arms/Hands: None  Home Assistive Devices/Equipment Home Assistive  Devices/Equipment: None  Therapy Consults (therapy consults require a physician order) PT Evaluation Needed: No OT Evalulation Needed: No SLP Evaluation Needed: No Abuse/Neglect Assessment (Assessment to be complete while patient is alone) Physical Abuse: Denies Verbal Abuse: Denies Sexual Abuse: Denies Exploitation of patient/patient's resources: Denies Self-Neglect: Denies Values / Beliefs Cultural Requests During Hospitalization: None Spiritual Requests During Hospitalization: None Consults Spiritual Care Consult Needed: No Social Work Consult Needed: No Regulatory affairs officer (For Healthcare) Does Patient Have a Medical Advance Directive?: No Would patient like information on creating a medical advance directive?: No - Patient declined    Additional Information 1:1 In Past 12 Months?: No CIRT Risk: No Elopement Risk: No Does patient have medical clearance?: Yes     Disposition: Case was staffed with Reita Cliche DNP who recommended a inpatient admission appropriate bed placement is investigated.   Disposition Initial Assessment Completed for this Encounter: Yes Disposition of Patient: Inpatient treatment program Type of inpatient treatment program: Adult  On Site Evaluation by:   Reviewed with Physician:    Mamie Nick 11/04/2017 6:32 PM

## 2017-11-04 NOTE — BH Assessment (Signed)
[facility] Assessment Progress Note Case was staffed with Reita Cliche DNP who recommended a inpatient admission appropriate bed placement is investigated.

## 2017-11-05 DIAGNOSIS — Z915 Personal history of self-harm: Secondary | ICD-10-CM

## 2017-11-05 DIAGNOSIS — Y900 Blood alcohol level of less than 20 mg/100 ml: Secondary | ICD-10-CM

## 2017-11-05 DIAGNOSIS — Z6837 Body mass index (BMI) 37.0-37.9, adult: Secondary | ICD-10-CM

## 2017-11-05 DIAGNOSIS — F1290 Cannabis use, unspecified, uncomplicated: Secondary | ICD-10-CM

## 2017-11-05 DIAGNOSIS — F1390 Sedative, hypnotic, or anxiolytic use, unspecified, uncomplicated: Secondary | ICD-10-CM

## 2017-11-05 DIAGNOSIS — G8929 Other chronic pain: Secondary | ICD-10-CM

## 2017-11-05 DIAGNOSIS — R45850 Homicidal ideations: Secondary | ICD-10-CM

## 2017-11-05 DIAGNOSIS — E669 Obesity, unspecified: Secondary | ICD-10-CM

## 2017-11-05 DIAGNOSIS — F312 Bipolar disorder, current episode manic severe with psychotic features: Secondary | ICD-10-CM

## 2017-11-05 LAB — HEMOGLOBIN A1C
HEMOGLOBIN A1C: 4.9 % (ref 4.8–5.6)
MEAN PLASMA GLUCOSE: 93.93 mg/dL

## 2017-11-05 LAB — LIPID PANEL
CHOL/HDL RATIO: 5.1 ratio
Cholesterol: 229 mg/dL — ABNORMAL HIGH (ref 0–200)
HDL: 45 mg/dL (ref >40)
LDL Cholesterol: 145 mg/dL — ABNORMAL HIGH (ref 0–99)
Triglycerides: 196 mg/dL — ABNORMAL HIGH (ref <150)
VLDL: 39 mg/dL (ref 0–40)

## 2017-11-05 LAB — TSH: TSH: 2.823 u[IU]/mL (ref 0.350–4.500)

## 2017-11-05 MED ORDER — RISPERIDONE 0.5 MG PO TABS
0.5000 mg | ORAL_TABLET | Freq: Once | ORAL | Status: AC
  Administered 2017-11-05: 0.5 mg via ORAL
  Filled 2017-11-05 (×2): qty 1

## 2017-11-05 MED ORDER — RISPERIDONE 1 MG PO TABS
1.0000 mg | ORAL_TABLET | Freq: Every day | ORAL | Status: DC
  Administered 2017-11-05 – 2017-11-07 (×3): 1 mg via ORAL
  Filled 2017-11-05 (×4): qty 1

## 2017-11-05 MED ORDER — RISPERIDONE 0.5 MG PO TABS: 0.5000 mg | ORAL_TABLET | Freq: Every day | ORAL | Status: DC

## 2017-11-05 NOTE — BHH Counselor (Signed)
Clinical Social Work Note  Because of patient's labile mood and her threats during group to kill everyone, Psychosocial Assessment will not be attempted today.  CSW to follow up tomorrow.  Selmer Dominion, LCSW 11/05/2017, 1:04 PM

## 2017-11-05 NOTE — Progress Notes (Signed)
Adult Psychoeducational Group Note  Date:  11/05/2017 Time:  9:03 PM  Group Topic/Focus:  Wrap-Up Group:   The focus of this group is to help patients review their daily goal of treatment and discuss progress on daily workbooks.  Participation Level:  Active  Participation Quality:  Appropriate  Affect:  Appropriate  Cognitive:  Alert and Oriented  Insight: Good  Engagement in Group:  Engaged  Modes of Intervention:  Discussion  Additional Comments:  Pt rated her day a 2/10 and said her goal was to "not lose her shit". Pt stated she achieved her goal and is going to work on the same thing tomorrow.   Milas Kocher 11/05/2017, 9:03 PM

## 2017-11-05 NOTE — BHH Group Notes (Signed)
Orientation / Goals   Date:  11/05/2017  Time:  9:41 AM  Type of Therapy:  Nurse Education  /   The group focuses on teaching patients who their staff is, what the staf's responsibiliities are, what the unit programming / scheduling looks like and introducing them to SMART goal - setting.   Participation Level:  Active  Participation Quality:  Appropriate  Affect:  Appropriate  Cognitive:  Alert  Insight:  Good  Engagement in Group:  Engaged  Modes of Intervention:  Education  Summary of Progress/Problems:  Lauralyn Primes 11/05/2017, 9:41 AM

## 2017-11-05 NOTE — [facility] Suicide Risk Assessment (Signed)
[facility] Admission Suicide Risk Assessment   Nursing information obtained from:    Demographic factors:    Current Mental Status:    Loss Factors:    Historical Factors:    Risk Reduction Factors:     Total Time spent with patient: 45 minutes Principal Problem: Bipolar disorder, unspecified ([place]) Diagnosis:   Patient Active Problem List   Diagnosis Date Noted  . Bipolar disorder, unspecified ([place]) [F31.9] 11/04/2017  . Cervical disc disorder with radiculopathy of cervical region [M50.10] 10/21/2017  . S/P spinal surgery [Z98.890] 08/12/2017  . DDD (degenerative disc disease), lumbar [M51.36] 01/13/2017  . Depression with anxiety [F41.8] 08/04/2016  . 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 PMDD (premenstrual dysphoric disorder) [F32.81] 07/29/2015  . Preventative health care [Z00.00] 10/29/2014  . Obesity [E66.9] 06/10/2014  . 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 Lumbar pain with radiation down both legs [M54.5] 09/11/2013  . Generalized anxiety disorder [F41.1] 06/18/2013   Subjective Data:  37 y.o Caucasian female, married, lives with her husband and two kids. Background history of MDD, anxiety disorder and chronic pain. Presented to the ER in company of her family. She reports that her antidepressant was switched two weeks ago from Venlafaxine to Vortioxetine. Says since then she has been having thoughts of rage. She feels like running people over with her car. She has been having thoughts of harm towards her husband. She has been having passive death wish but no actual suicidal thoughts. She has been hearing voices stating that people are out to get her. Routine labs shows increased lipid panels. Other parameters are essentially normal. Toxicology is negative. UDS is positive for benzodiazepines and THC. BAL <5 mg/dl. Patient raged at group today. She threatened to kill everyone. She is very dysphoric and irritable. She is manic at this point. She  has past history of self harm. She is cooperative with care. She has agreed to treatment recommendations. She has agreed to communicate suicidal thoughts to staff if the thoughts becomes overwhelming.     Continued Clinical Symptoms:  Alcohol Use Disorder Identification Test Final Score (AUDIT): 1 The "Alcohol Use Disorders Identification Test", Guidelines for Use in Primary Care, Second Edition.  World Pharmacologist [facility]). Score between 0-7:  no or low risk or alcohol related problems. Score between 8-15:  moderate risk of alcohol related problems. Score between 16-19:  high risk of alcohol related problems. Score 20 or above:  warrants further diagnostic evaluation for alcohol dependence and treatment.   CLINICAL FACTORS:   Bipolar Disorder:   Bipolar II   Musculoskeletal: Strength & Muscle Tone: within normal limits Gait & Station: normal Patient leans: N/A  Psychiatric Specialty Exam: Physical Exam  ROS  Blood pressure (!) 123/98, pulse 86, temperature 99.1 F (37.3 C), temperature source Oral, resp. rate 12, height 5\' 4"  (1.626 m), weight 98.9 kg (218 lb).Body mass index is 37.42 kg/m.  General Appearance: As in H&P  Eye Contact:    Speech:    Volume:    Mood:    Affect:    Thought Process:    Orientation:    Thought Content:  As in H&P  Suicidal Thoughts:    Homicidal Thoughts:    Memory:    Judgement:    Insight:    Psychomotor Activity:    Concentration:    Recall:    Fund of Knowledge:    Language:  As in H&P  Akathisia:    Handed:  AIMS (if indicated):     Assets:    ADL's:    Cognition:  As in H&P  Sleep:  Number of Hours: 5.5      COGNITIVE FEATURES THAT CONTRIBUTE TO RISK:  Loss of executive function    SUICIDE RISK:   Moderate:  Frequent suicidal ideation with limited intensity, and duration, some specificity in terms of plans, no associated intent, good self-control, limited dysphoria/symptomatology, some risk factors present, and  identifiable protective factors, including available and accessible social support.  PLAN OF CARE:  As in H&P  I certify that inpatient services furnished can reasonably be expected to improve the patient's condition.   Artist Beach, MD 11/05/2017, 11:20 AM

## 2017-11-05 NOTE — Progress Notes (Addendum)
D.Pt presents with anxious affect and behavior, teary at times. Per pt's self inventory, pt rates her depression,hopelessness and anxiety an 8/9/9, respectively. Following am group led by SW, pt approached this nurse in tears, requesting her medications. Pt crying, states, "I just don't feel good". Pt was unable to elaborate other than she "mentally" didn't feel right. Pt's room mate moved to other room due to reported threats made in group and HI expressed to roommate.  Pt currently observed sleeping in bed -respirations even and unlabored. A. Labs and vitals monitored. Pt compliant with medications. Pt supported emotionally and encouraged to express concerns and ask questions.   R. Pt remains safe with 15 minute checks. Will continue POC.

## 2017-11-05 NOTE — H&P (Signed)
Psychiatric Admission Assessment Adult  Patient Identification: Gina James MRN:  [id number] Date of Evaluation:  11/05/2017 Chief Complaint: Increased aggression and violent thoughts Principal Diagnosis:  Bipolar Disorder Diagnosis:   Patient Active Problem List   Diagnosis Date Noted  . Severe recurrent major depression without psychotic features ([place]) [F33.2] 11/04/2017  . Cervical disc disorder with radiculopathy of cervical region [M50.10] 10/21/2017  . S/P spinal surgery [Z98.890] 08/12/2017  . DDD (degenerative disc disease), lumbar [M51.36] 01/13/2017  . Depression with anxiety [F41.8] 08/04/2016  . 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 PMDD (premenstrual dysphoric disorder) [F32.81] 07/29/2015  . Preventative health care [Z00.00] 10/29/2014  . Obesity [E66.9] 06/10/2014  . 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 Lumbar pain with radiation down both legs [M54.5] 09/11/2013  . Generalized anxiety disorder [F41.1] 06/18/2013   History of Present Illness:   37 y.o Caucasian female, married, lives with her husband and two kids. Background history of MDD, anxiety disorder and chronic pain. Presented to the ER in company of her family. She reports that her antidepressant was switched two weeks ago from Venlafaxine to Vortioxetine. Says since then she has been having thoughts of rage. She feels like running people over with her car. She has been having thoughts of harm towards her husband. She has been having passive death wish but no actual suicidal thoughts. She has been hearing voices stating that people are out to get her. Routine labs shows increased lipid panels. Other parameters are essentially normal. Toxicology is negative. UDS is positive for benzodiazepines and THC. BAL <5 mg/dl.  Patient reports family history of mood disorder. Says she has been depressed since her teens. She had post partum depression after her second child was born.  She had been treated with antidepressants but gets very anxious on them. Says that was the reason for recent switch of her medication. Patient is very distraught. Says she has been hearing her own thoughts loud. Tells her that they are people out to get her. No associated visual hallucinations. No passivity of will. Says she has been having vivid dreams that are very violent. She has been having a lot of rage. Patient is not able to tolerate being around people. Says she has been crying more. She is not sleeping well at night. Says she is worried she might harm others. No specific thoughts of homicide. No suicidal thoughts. Patient denies any other stressor. Says she has a perfect life and does not know why she is feeling like this. Reports use of THC. Denies use of any other substance. No synthetic substance use. No weapons at home.    Total Time spent with patient: 1 hour  Past Psychiatric History: Depression since her late teens. Had post partum depression after her second child. She has been treated with Venlafaxine, Lexapro, Bupropion and Vortioxetine. Felt activated by all of them. Says she was given benzodiazepines. She does not like the way it makes her feel. She has never been on a mood stabilizer.  She overdosed when she was 37 years of age. She tried to suffocate self a year ago. No other suicidal behavior. No history of self mutilation or cutting.  No past history of mania. No past history of psychosis.  No past history of violent behavior. She has never had any inpatient treatment in the past. No past psychological or physical treatment.    Is the patient at risk to self? Yes.    Has the patient  been a risk to self in the past 6 months? No.  Has the patient been a risk to self within the distant past? No.  Is the patient a risk to others? Yes.    Has the patient been a risk to others in the past 6 months? No.  Has the patient been a risk to others within the distant past? No.   Prior  Inpatient Therapy:   Prior Outpatient Therapy:    Alcohol Screening: 1. How often do you have a drink containing alcohol?: Monthly or less 2. How many drinks containing alcohol do you have on a typical day when you are drinking?: 1 or 2 3. How often do you have six or more drinks on one occasion?: Never AUDIT-C Score: 1 4. How often during the last year have you found that you were not able to stop drinking once you had started?: Never 5. How often during the last year have you failed to do what was normally expected from you becasue of drinking?: Never 6. How often during the last year have you needed a first drink in the morning to get yourself going after a heavy drinking session?: Never 7. How often during the last year have you had a feeling of guilt of remorse after drinking?: Never 8. How often during the last year have you been unable to remember what happened the night before because you had been drinking?: Never 9. Have you or someone else been injured as a result of your drinking?: No 10. Has a relative or friend or a doctor or another health worker been concerned about your drinking or suggested you cut down?: No Alcohol Use Disorder Identification Test Final Score (AUDIT): 1 Intervention/Follow-up: AUDIT Score <7 follow-up not indicated Substance Abuse History in the last 12 months:  Yes.   Consequences of Substance Abuse: NA Previous Psychotropic Medications: Yes  Psychological Evaluations: Yes  Past Medical History:  Past Medical History:  Diagnosis Date  . Ankle fracture, right   . Anxiety   . Arthritis    "in my back" (04/08/2015)  . Chronic lower back pain   . Complication of anesthesia    difficulty urinating after anesthesia  . Headache    "monthly" (04/08/2015)  . Post partum depression     Past Surgical History:  Procedure Laterality Date  . ANTERIOR FUSION LUMBAR SPINE  07/06/2017  . BREAST SURGERY    . CESAREAN SECTION  2007 and 2009  . COMBINED  ABDOMINOPLASTY AND LIPOSUCTION  12/2014   "liposuction in thighs"  . FRACTURE SURGERY    . LAPAROSCOPIC ABDOMINAL EXPLORATION  1997   "to check for endometriosis"  . LAPAROSCOPIC CHOLECYSTECTOMY  2007  . OPEN REDUCTION INTERNAL FIXATION (ORIF) TIBIA/FIBULA FRACTURE Right 04/08/2015   Procedure: OPEN REDUCTION INTERNAL FIXATION (ORIF)  RIGHT TIBIA PILON FRACTURE;  Surgeon: Wylene Simmer, MD;  Location: [facility];  Service: Orthopedics;  Laterality: Right;  . TIBIA FRACTURE SURGERY Right 04/08/2015   ORIF tibial pilon   Family History:  Family History  Problem Relation Age of Onset  . Diabetes Father   . Cancer Sister 30       breast, had chemo/double mastectomy  . Heart disease Maternal Grandfather   . Heart disease Paternal Grandfather    Family Psychiatric  History: Strong family history of Mood Disorder.  Tobacco Screening: Have you used any form of tobacco in the last 30 days? (Cigarettes, Smokeless Tobacco, Cigars, and/or Pipes): No Social History:  Social History   Substance  and Sexual Activity  Alcohol Use Yes  . Alcohol/week: 2.4 oz  . Types: 4 Cans of beer per week   Comment: 04/08/2015 "drink only the weekends"     Social History   Substance and Sexual Activity  Drug Use No    Additional Social History:  Allergies:   Allergies  Allergen Reactions  . Ciprofloxacin Hives  . Lexapro [Escitalopram] Nausea And Vomiting  . Amoxicillin Hives and Other (See Comments)    Has patient had a PCN reaction causing immediate rash, facial/tongue/throat swelling, SOB or lightheadedness with hypotension: no Has patient had a PCN reaction causing severe rash involving mucus membranes or skin necrosis: no Has patient had a PCN reaction that required hospitalization: no Has patient had a PCN reaction occurring within the last 10 years: no - childhood reaction If all of the above answers are "NO", then may proceed with Cephalosporin use.   . Ceclor [Cefaclor] Hives  . Erythromycin Base Rash   . Penicillins Hives and Other (See Comments)    Has patient had a PCN reaction causing immediate rash, facial/tongue/throat swelling, SOB or lightheadedness with hypotension: no Has patient had a PCN reaction causing severe rash involving mucus membranes or skin necrosis: no Has patient had a PCN reaction that required hospitalization: no Has patient had a PCN reaction occurring within the last 10 years: no - childhood reaction If all of the above answers are "NO", then may proceed with Cephalosporin use.  Marland Kitchen Phenergan [Promethazine Hcl] Hives  . Sulfa Antibiotics Hives  . Zithromax [Azithromycin] Hives   Lab Results:  Results for orders placed or performed during the hospital encounter of 11/04/17 (from the past 48 hour(s))  Lipid panel     Status: Abnormal   Collection Time: 11/05/17  6:11 AM  Result Value Ref Range   Cholesterol 229 (H) 0 - 200 mg/dL   Triglycerides 196 (H) <150 mg/dL   HDL 45 >40 mg/dL   Total CHOL/HDL Ratio 5.1 RATIO   VLDL 39 0 - 40 mg/dL   LDL Cholesterol 145 (H) 0 - 99 mg/dL    Comment:        Total Cholesterol/HDL:CHD Risk Coronary Heart Disease Risk Table                     Men   Women  1/2 Average Risk   3.4   3.3  Average Risk       5.0   4.4  2 X Average Risk   9.6   7.1  3 X Average Risk  23.4   11.0        Use the calculated Patient Ratio above and the CHD Risk Table to determine the patient's CHD Risk.        ATP III CLASSIFICATION (LDL):  <100     mg/dL   Optimal  100-129  mg/dL   Near or Above                    Optimal  130-159  mg/dL   Borderline  160-189  mg/dL   High  >190     mg/dL   Very High Performed at [facility] [address], [place], [place] [address]   TSH     Status: None   Collection Time: 11/05/17  6:11 AM  Result Value Ref Range   TSH 2.823 0.350 - 4.500 uIU/mL    Comment: Performed by a 3rd Generation assay with a functional sensitivity  of <=0.01 uIU/mL. Performed at [facility], [place] [address], [place], [place] [address]     Blood Alcohol level:  Lab Results  Component Value Date   ETH <10 11/04/2017   ETH <10 09/81/1914    Metabolic Disorder Labs:  Lab Results  Component Value Date   HGBA1C 5.1 10/28/2014   No results found for: PROLACTIN Lab Results  Component Value Date   CHOL 229 (H) 11/05/2017   TRIG 196 (H) 11/05/2017   HDL 45 11/05/2017   CHOLHDL 5.1 11/05/2017   VLDL 39 11/05/2017   LDLCALC 145 (H) 11/05/2017   LDLCALC 87 10/28/2014    Current Medications: Current Facility-Administered Medications  Medication Dose Route Frequency Provider Last Rate Last Dose  . acetaminophen (TYLENOL) tablet 650 mg  650 mg Oral Q6H PRN Lindon Romp A, NP      . alum & mag hydroxide-simeth (MAALOX/MYLANTA) 200-200-20 MG/5ML suspension 30 mL  30 mL Oral Q4H PRN Lindon Romp A, NP      . gabapentin (NEURONTIN) capsule 300 mg  300 mg Oral TID Lindon Romp A, NP   300 mg at 11/05/17 0900  . hydrOXYzine (ATARAX/VISTARIL) tablet 50 mg  50 mg Oral TID PRN Rozetta Nunnery, NP      . levonorgestrel-ethinyl estradiol (SEASONALE,INTROVALE,JOLESSA) 0.15-0.03 MG per tablet 1 tablet  1 tablet Oral QHS Lindon Romp A, NP   1 tablet at 11/04/17 2337  . magnesium hydroxide (MILK OF MAGNESIA) suspension 30 mL  30 mL Oral Daily PRN Lindon Romp A, NP      . methocarbamol (ROBAXIN) tablet 750 mg  750 mg Oral BID PRN Lindon Romp A, NP   750 mg at 11/04/17 2332  . naproxen (NAPROSYN) tablet 500 mg  500 mg Oral BID PRN Lindon Romp A, NP      . topiramate (TOPAMAX) tablet 50 mg  50 mg Oral QHS Lindon Romp A, NP   50 mg at 11/04/17 2332  . traZODone (DESYREL) tablet 50 mg  50 mg Oral QHS Lindon Romp A, NP   50 mg at 11/04/17 2332  . valACYclovir (VALTREX) tablet 500 mg  500 mg Oral QHS Lindon Romp A, NP      . venlafaxine XR (EFFEXOR-XR) 24 hr capsule 150 mg  150 mg Oral QHS Lindon Romp A, NP   150 mg at 11/04/17 2332   PTA Medications: Medications Prior to  Admission  Medication Sig Dispense Refill Last Dose  . gabapentin (NEURONTIN) 300 MG capsule Take 1 capsule (300 mg total) by mouth 3 (three) times daily. 90 capsule 3 11/04/2017  . hydrOXYzine (VISTARIL) 50 MG capsule Take 1 capsule (50 mg total) by mouth 3 (three) times daily as needed. (Patient taking differently: Take 50 mg by mouth 3 (three) times daily as needed for anxiety. ) 30 capsule 0 11/03/2017  . JOLESSA 0.15-0.03 MG tablet TAKE ONE TABLET BY MOUTH ONCE DAILY (Patient taking differently: TAKE ONE TABLET BY MOUTH AT BEDTIME.) 91 tablet 4 11/03/2017  . LORazepam (ATIVAN) 1 MG tablet Take 1 mg by mouth daily.     . methocarbamol (ROBAXIN) 750 MG tablet Take 1 tablet (750 mg total) by mouth 2 (two) times daily as needed for muscle spasms. 60 tablet 1 11/04/2017  . naproxen sodium (ALEVE) 220 MG tablet Take 660 mg by mouth 2 (two) times daily as needed (For headache.).   11/03/2017  . prochlorperazine (COMPAZINE) 10 MG tablet Take 10 mg by mouth every 6 (six) hours as  needed for nausea or vomiting.   11/04/2017  . topiramate (TOPAMAX) 50 MG tablet Take 1 tablet (50 mg total) by mouth at bedtime. 30 tablet 2 11/03/2017  . traZODone (DESYREL) 50 MG tablet Take 1 tablet (50 mg total) by mouth at bedtime. 30 tablet 0 not started yet  . valACYclovir (VALTREX) 500 MG tablet TAKE ONE TABLET BY MOUTH ONCE DAILY (Patient taking differently: TAKE ONE TABLET BY MOUTH AT BEDTIME.) 30 tablet 5 11/03/2017  . venlafaxine XR (EFFEXOR-XR) 150 MG 24 hr capsule Take 1 capsule (150 mg total) by mouth at bedtime. 30 capsule 0 11/03/2017  . venlafaxine XR (EFFEXOR-XR) 75 MG 24 hr capsule Take 1 capsule (75 mg total) by mouth every morning. 30 capsule 1 11/04/2017    Musculoskeletal: Strength & Muscle Tone: within normal limits Gait & Station: normal Patient leans: N/A  Psychiatric Specialty Exam: Physical Exam  Constitutional: She appears well-developed and well-nourished.  HENT:  Head: Normocephalic and atraumatic.   Respiratory: Effort normal.  Neurological: She is alert.  Psychiatric:  As above    ROS  Blood pressure (!) 123/98, pulse 86, temperature 99.1 F (37.3 C), temperature source Oral, resp. rate 12, height 5\' 4"  (1.626 m), weight 98.9 kg (218 lb).Body mass index is 37.42 kg/m.  General Appearance: In hospital clothing, tearful, irriatble  Eye Contact:  Fair  Speech:  Pressured  Volume:  Increased  Mood:  Dysphoric and Irritable  Affect:  Congruent  Thought Process:  Linear  Orientation:  Full (Time, Place, and Person)  Thought Content:  Lot of rage and violent thoughts.   Suicidal Thoughts:  No  Homicidal Thoughts:  Yes.  without intent/plan  Memory: Unable to assess at this time.   Judgement:  Impaired  Insight:  Good  Psychomotor Activity:  Increased  Concentration:  Limited   Recall:  Unable to assess at this time.   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 Since her teenage years she has suffered from depression with marked anxiety symptoms. Antidepressant medications has made her more anxious. She is presenting with  A lot of dysphoria and rage in context of antidepressant switch. I strongly suspect that she has an underlying bipolarity. We discussed use of Risperidone as a mood stabilizer. She consented to treatment after we reviewed the risks and benefits. We plan to wean her off antidepressants and evaluate her further.  Psychiatric: Bipolar Disorder  Medical: Chronic pain Obesity  Psychosocial:   PLAN: 1. Risperidone 0.5 mg stat 2. Risperidone 1 mg at bedtime. Would titrate as needed/tolerated. 3. Discontinue Venlafaxine 4.  Encourage unit groups and therapeutic activities as she gets better 5. Monitor mood, behavior and interaction with peers 6. SW would gather collateral from her family and coordinate aftercare   Observation Level/Precautions:  15 minute checks  Laboratory:    Psychotherapy:    Medications:    Consultations:    Discharge Concerns:    Estimated LOS:  Other:     Physician Treatment Plan for Primary Diagnosis: <principal problem not specified> Long Term Goal(s): Improvement in symptoms so as ready for discharge  Short Term Goals: Ability to identify changes in lifestyle  to reduce recurrence of condition will improve, Ability to verbalize feelings will improve, Ability to disclose and discuss suicidal ideas, Ability to demonstrate self-control will improve, Ability to identify and develop effective coping behaviors will improve, Ability to maintain clinical measurements within normal limits will improve, Compliance with prescribed medications will improve and Ability to identify triggers associated with substance abuse/mental health issues will improve  Physician Treatment Plan for Secondary Diagnosis: Active Problems:   Severe recurrent major depression without psychotic features ([place])  Long Term Goal(s): Improvement in symptoms so as ready for discharge  Short Term Goals: Ability to identify changes in lifestyle to reduce recurrence of condition will improve, Ability to verbalize feelings will improve, Ability to disclose and discuss suicidal ideas, Ability to demonstrate self-control will improve, Ability to identify and develop effective coping behaviors will improve, Ability to maintain clinical measurements within normal limits will improve, Compliance with prescribed medications will improve and Ability to identify triggers associated with substance abuse/mental health issues will improve  I certify that inpatient services furnished can reasonably be expected to improve the patient's  condition.    Artist Beach, MD 3/2/201910:25 AM

## 2017-11-05 NOTE — [facility] Group Notes (Signed)
LCSW Group Therapy Note  11/05/2017 10:00AM - 11:15 - [address], 11:15-12:00 - [address]  Type of Therapy and Topic:  Group Therapy: Anger Cues and Responses  Participation Level:  Minimal   Description of Group:   In this group, patients learned how to recognize the physical, cognitive, emotional, and behavioral responses they have to anger-provoking situations.  They identified a recent time they became angry and how they reacted.  They analyzed how their reaction was possibly beneficial and how it was possibly unhelpful.  The group discussed a variety of healthier coping skills that could help with such a situation in the future.  Deep breathing was practiced briefly.  Therapeutic Goals: 1. Patients will remember their last incident of anger and how they felt emotionally and physically, what their thoughts were at the time, and how they behaved. 2. Patients will identify how their behavior at that time worked for them, as well as how it worked against them. 3. Patients will explore possible new behaviors to use in future anger situations. 4. Patients will learn that anger itself is normal and cannot be eliminated, and that healthier reactions can assist with resolving conflict rather than worsening situations.  Summary of Patient Progress:  The patient shared that their most recent time of anger was yesterday at her husband and this was because he asked her if she was okay to take their daughter to school.  She was outspoken during group and expressed anger that she was not being told exactly what to do that would take care of her anger problem.  She eventually became very red in her face, and yelled at the entire group, pointing around the room and saying "I could kill every single one of you by tonight and nobody could stop me."  When told this was an inappropriate way to talk in group, she left angrily.  The group had to take time after her departure to process what she had said, and  eventually CSW spoke with doctor about her having a room alone due to her volatility.  Therapeutic Modalities:   Cognitive Behavioral Therapy  Maretta Los  11/05/2017 9:01 AM

## 2017-11-06 MED ORDER — VENLAFAXINE HCL ER 37.5 MG PO CP24
37.5000 mg | ORAL_CAPSULE | Freq: Once | ORAL | Status: AC
  Administered 2017-11-06: 37.5 mg via ORAL
  Filled 2017-11-06 (×2): qty 1

## 2017-11-06 NOTE — Progress Notes (Signed)
Patient ID: Gina James, female   DOB: June 10, 1981, 37 y.o.   MRN: [id number]  D: Patient observed watching TV and interacting well with peers on approach. Pt reports she had a good day and has not cried. Mood and affect appears depressed and flat. Pt attended evening wrap up group and engaged in discussions. Denies  SI/HI/AVH.No behavioral issues noted.  A: Support and encouragement offered as needed to express needs. Medications administered as prescribed.  R: Patient is safe and cooperative on unit. Will continue to monitor  for safety and stability.

## 2017-11-06 NOTE — Progress Notes (Signed)
Patient ID: Gina James, female   DOB: 1980-10-26, 37 y.o.   MRN: [id number]  D: Patient is visible on the unit interacting well with peers on approach. Pt reports she is having "brain zaps" and believe she missed a dose of effexor. Pt notified the Effexor has been discontinued. Pt started having a panic attacks stating she was not notified of medication been discontinued. NP notified and new orders of 37.5 Effexor given. Pt attended evening wrap up group and engaged in discussions. Denies  SI/HI/AVH. A: Support and encouragement offered as needed to express needs. Medications administered as prescribed.  R: Patient is safe and cooperative on unit. Will continue to monitor  for safety and stability.

## 2017-11-06 NOTE — [facility] Group Notes (Signed)
[facility] LCSW Group Therapy Note  Date/Time:  11/06/2017 10:00-11:00AM  Type of Therapy and Topic:  Group Therapy:  Healthy and Unhealthy Supports  Participation Level:  Active   Description of Group:  Patients in this group were introduced to the idea of adding a variety of healthy supports to address the various needs in their lives.Patients discussed what additional healthy supports could be helpful in their recovery and wellness after discharge in order to prevent future hospitalizations.   An emphasis was placed on using counselor, doctor, therapy groups, 12-step groups, and problem-specific support groups to expand supports.  They also worked as a group on developing a specific plan for several patients to deal with unhealthy supports through [place], psychoeducation with loved ones, and even termination of relationships.   Therapeutic Goals:   1)  discuss importance of adding supports to stay well once out of the hospital  2)  compare healthy versus unhealthy supports and identify some examples of each  3)  generate ideas and descriptions of healthy supports that can be added  4)  offer mutual support about how to address unhealthy supports  5)  encourage active participation in and adherence to discharge plan    Summary of Patient Progress:  The patient expressed a willingness to add family's increased education about her mental health issues to help in her recovery journey.   Therapeutic Modalities:   Motivational Interviewing Brief Solution-Focused Therapy  Selmer Dominion, LCSW

## 2017-11-06 NOTE — Progress Notes (Signed)
D Patient is seen out in the milieu...she interacts with her peers appropriately. She endorses a moderate amount of free-floating anxiety. She says " I know its a problem for me...that 's why I know I ave to take the medicine...becasue it helps my symptoms so much". She attends her groups and she has spoken to this writer about what her dc paln is...that she is expecting to be discharged home tomorrow and she wants to resume guitar-playing as a stress-reducer / healthy coping skill. A She completed her daily assessment and on this she wrote she denied SI today and she rated her depression, hopelessness and anxeity " 5/4/5", respectively. R Safety is in place.

## 2017-11-06 NOTE — Progress Notes (Signed)
Patient ID: Gina James, female   DOB: 05/27/1981, 38 y.o.   MRN: [id number]  D: Patient is visible on the unit interacting well with peers on approach. Pt reports she  having "brain zaps" and believe she missed a dose of effexor. Pt notified the Effexor has been discontinued. Pt stated having a panic attacks stating she was not notified of medication been discontinued. NP notified and new orders of 37.5mg  Effexor given. Pt attended evening wrap up group and engaged in discussions. Denies  SI/HI/AVH. A: Support and encouragement offered as needed to express needs. Medications administered as prescribed.  R: Patient is safe and cooperative on unit. Will continue to monitor  for safety and stability.

## 2017-11-06 NOTE — [facility] Group Notes (Signed)
Orientation / Goals   Date:  11/06/2017  Time:  9:31 AM  Type of Therapy:  Nurse Education  /  The group is focused on teaching patients who their staff is, what staff's responsibiliities are and what unit programming / scheduling looks like.   Participation Level:  Active  Participation Quality:  Attentive  Affect:  Appropriate  Cognitive:  Appropriate  Insight:  Appropriate  Engagement in Group:  Engaged  Modes of Intervention:  Education  Summary of Progress/Problems:  Lauralyn Primes 11/06/2017, 9:31 AM

## 2017-11-06 NOTE — Progress Notes (Signed)
[facility] MD Progress Note  11/06/2017 10:26 AM OPHA MCGHEE  MRN:  [id number] Subjective:   37 y.o Caucasian female, married, lives with her husband and two kids. Background history of MDD, anxiety disorder and chronic pain. Presented to the ER in company of her family. She reports that her antidepressant was switched two weeks ago from Venlafaxine to Vortioxetine. Says since then she has been having thoughts of rage. She feels like running people over with her car. She has been having thoughts of harm towards her husband. She has been having passive death wish but no actual suicidal thoughts. She has been hearing voices stating that people are out to get her. Routine labs shows increased lipid panels. Other parameters are essentially normal. Toxicology is negative. UDS is positive for benzodiazepines and THC. BAL <5 mg/dl.  Chart reviewed today. Patient discussed at team today.  Staff reports that she was distressed yesterday morning. She disrupted group and made her peers nervous when she announced that she has violent thoughts towards everybody. She calmed down with medication. She slept well last night. She has been more appropriate today at group. She has not voiced any suicidal or homicidal thoughts lately. No behavioral issues lately. No side effects from her medication.  Seen today. Says she has been feeling better on her new medication. Felt a little bit groggy this morning. Reassured that it would ease with time. Says she has not had any of those violent thoughts. She has not had any rage lately. She is no longer feeling irritable. Says she is surprised and somehow anticipates it might come back. No abnormal perception. No grandiose or any other abnormal belief. Her husband came to visit her yesterday. Says the visitation went well. They are still processing her new diagnosis. Some denial from her husband's perspective. Reassured that it is a natural phenomena. We agreed to focus on symptom  reduction rather than dwelling on labels. We discussed the various spectrum of mood disorders. Encouraged.   Principal Problem: Bipolar disorder, unspecified ([place]) Diagnosis:   Patient Active Problem List   Diagnosis Date Noted  . Bipolar disorder, unspecified ([place]) [F31.9] 11/04/2017  . Cervical disc disorder with radiculopathy of cervical region [M50.10] 10/21/2017  . S/P spinal surgery [Z98.890] 08/12/2017  . DDD (degenerative disc disease), lumbar [M51.36] 01/13/2017  . Depression with anxiety [F41.8] 08/04/2016  . 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 PMDD (premenstrual dysphoric disorder) [F32.81] 07/29/2015  . Preventative health care [Z00.00] 10/29/2014  . Obesity [E66.9] 06/10/2014  . 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 Lumbar pain with radiation down both legs [M54.5] 09/11/2013  . Generalized anxiety disorder [F41.1] 06/18/2013   Total Time spent with patient: 20 minutes  Past Psychiatric History: As in H&P  Past Medical History:  Past Medical History:  Diagnosis Date  . Ankle fracture, right   . Anxiety   . Arthritis    "in my back" (04/08/2015)  . Chronic lower back pain   . Complication of anesthesia    difficulty urinating after anesthesia  . Headache    "monthly" (04/08/2015)  . Post partum depression     Past Surgical History:  Procedure Laterality Date  . ANTERIOR FUSION LUMBAR SPINE  07/06/2017  . BREAST SURGERY    . CESAREAN SECTION  2007 and 2009  . COMBINED ABDOMINOPLASTY AND LIPOSUCTION  12/2014   "liposuction in thighs"  . FRACTURE SURGERY    . LAPAROSCOPIC ABDOMINAL EXPLORATION  1997   "to  check for endometriosis"  . LAPAROSCOPIC CHOLECYSTECTOMY  2007  . OPEN REDUCTION INTERNAL FIXATION (ORIF) TIBIA/FIBULA FRACTURE Right 04/08/2015   Procedure: OPEN REDUCTION INTERNAL FIXATION (ORIF)  RIGHT TIBIA PILON FRACTURE;  Surgeon: Wylene Simmer, MD;  Location: [place];  Service: Orthopedics;  Laterality: Right;  . TIBIA  FRACTURE SURGERY Right 04/08/2015   ORIF tibial pilon   Family History:  Family History  Problem Relation Age of Onset  . Diabetes Father   . Cancer Sister 30       breast, had chemo/double mastectomy  . Heart disease Maternal Grandfather   . Heart disease Paternal Grandfather    Family Psychiatric  History: As in H&P Social History:  Social History   Substance and Sexual Activity  Alcohol Use Yes  . Alcohol/week: 2.4 oz  . Types: 4 Cans of beer per week   Comment: 04/08/2015 "drink only the weekends"     Social History   Substance and Sexual Activity  Drug Use No    Social History   Socioeconomic History  . Marital status: Married    Spouse name: None  . Number of children: None  . Years of education: None  . Highest education level: None  Social Needs  . Financial resource strain: None  . Food insecurity - worry: None  . Food insecurity - inability: None  . Transportation needs - medical: None  . Transportation needs - non-medical: None  Occupational History  . None  Tobacco Use  . Smoking status: Former Smoker    Packs/day: 0.12    Years: 10.00    Pack years: 1.20    Types: Cigarettes    Last attempt to quit: 09/06/2004    Years since quitting: 13.1  . Smokeless tobacco: Never Used  Substance and Sexual Activity  . Alcohol use: Yes    Alcohol/week: 2.4 oz    Types: 4 Cans of beer per week    Comment: 04/08/2015 "drink only the weekends"  . Drug use: No  . Sexual activity: Yes    Comment: vasectomy in partner  Other Topics Concern  . 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       Additional Social History:        Sleep: Fair last night as she slept during the day  Appetite:  better  Current Medications: Current Facility-Administered Medications  Medication Dose Route  Frequency Provider Last Rate Last Dose  . acetaminophen (TYLENOL) tablet 650 mg  650 mg Oral Q6H PRN Lindon Romp A, NP      . alum & mag hydroxide-simeth (MAALOX/MYLANTA) 200-200-20 MG/5ML suspension 30 mL  30 mL Oral Q4H PRN Lindon Romp A, NP      . gabapentin (NEURONTIN) capsule 300 mg  300 mg Oral TID Lindon Romp A, NP   300 mg at 11/06/17 8299  . hydrOXYzine (ATARAX/VISTARIL) tablet 50 mg  50 mg Oral TID PRN Rozetta Nunnery, NP      . levonorgestrel-ethinyl estradiol (SEASONALE,INTROVALE,JOLESSA) 0.15-0.03 MG per tablet 1 tablet  1 tablet Oral QHS Lindon Romp A, NP   1 tablet at 11/05/17 2157  . magnesium hydroxide (MILK OF MAGNESIA) suspension 30 mL  30 mL Oral Daily PRN Lindon Romp A, NP      . methocarbamol (ROBAXIN) tablet 750 mg  750 mg Oral BID PRN Lindon Romp A, NP   750 mg at 11/05/17 2156  . naproxen (NAPROSYN) tablet 500 mg  500 mg Oral BID PRN Lindon Romp A, NP      . risperiDONE (RISPERDAL) tablet 1 mg  1 mg Oral QHS Ashiah Karpowicz, Laruth Bouchard, MD   1 mg at 11/05/17 2156  . topiramate (TOPAMAX) tablet 50 mg  50 mg Oral QHS Lindon Romp A, NP   50 mg at 11/05/17 2157  . traZODone (DESYREL) tablet 50 mg  50 mg Oral QHS Lindon Romp A, NP   50 mg at 11/05/17 2157  . valACYclovir (VALTREX) tablet 500 mg  500 mg Oral QHS Lindon Romp A, NP   500 mg at 11/05/17 2157    Lab Results:  Results for orders placed or performed during the hospital encounter of 11/04/17 (from the past 48 hour(s))  Hemoglobin A1c     Status: None   Collection Time: 11/05/17  6:11 AM  Result Value Ref Range   Hgb A1c MFr Bld 4.9 4.8 - 5.6 %    Comment: (NOTE) Pre diabetes:          5.7%-6.4% Diabetes:              >6.4% Glycemic control for   <7.0% adults with diabetes    Mean Plasma Glucose 93.93 mg/dL    Comment: Performed at [facility], [place] [address] [place], [place] [address]  Lipid panel     Status: Abnormal   Collection Time: 11/05/17  6:11 AM  Result Value Ref Range   Cholesterol 229  (H) 0 - 200 mg/dL   Triglycerides 196 (H) <150 mg/dL   HDL 45 >40 mg/dL   Total CHOL/HDL Ratio 5.1 RATIO   VLDL 39 0 - 40 mg/dL   LDL Cholesterol 145 (H) 0 - 99 mg/dL    Comment:        Total Cholesterol/HDL:CHD Risk Coronary Heart Disease Risk Table                     Men   Women  1/2 Average Risk   3.4   3.3  Average Risk       5.0   4.4  2 X Average Risk   9.6   7.1  3 X Average Risk  23.4   11.0        Use the calculated Patient Ratio above and the CHD Risk Table to determine the patient's CHD Risk.        ATP III CLASSIFICATION (LDL):  <100     mg/dL   Optimal  100-129  mg/dL   Near or Above                    Optimal  130-159  mg/dL   Borderline  160-189  mg/dL   High  >190     mg/dL   Very High Performed at [facility] [address] [place], [place] [address]   TSH     Status: None   Collection Time: 11/05/17  6:11 AM  Result Value Ref Range   TSH 2.823 0.350 - 4.500 uIU/mL    Comment: Performed by a 3rd Generation assay with a functional sensitivity of <=0.01 uIU/mL. Performed at [facility], [place] [address], [place], [place] [address]     Blood Alcohol level:  Lab Results  Component Value Date   ETH <10 11/04/2017  ETH <10 50/53/9767    Metabolic Disorder Labs: Lab Results  Component Value Date   HGBA1C 4.9 11/05/2017   MPG 93.93 11/05/2017   No results found for: PROLACTIN Lab Results  Component Value Date   CHOL 229 (H) 11/05/2017   TRIG 196 (H) 11/05/2017   HDL 45 11/05/2017   CHOLHDL 5.1 11/05/2017   VLDL 39 11/05/2017   LDLCALC 145 (H) 11/05/2017   LDLCALC 87 10/28/2014    Physical Findings: AIMS: Facial and Oral Movements Muscles of Facial Expression: None, normal Lips and Perioral Area: None, normal Jaw: None, normal Tongue: None, normal,Extremity Movements Upper (arms, wrists, hands, fingers): None, normal Lower (legs, knees, ankles, toes): None, normal, Trunk Movements Neck, shoulders,  hips: None, normal, Overall Severity Severity of abnormal movements (highest score from questions above): None, normal Incapacitation due to abnormal movements: None, normal Patient's awareness of abnormal movements (rate only patient's report): No Awareness, Dental Status Current problems with teeth and/or dentures?: No Does patient usually wear dentures?: No  CIWA:    COWS:     Musculoskeletal: Strength & Muscle Tone: within normal limits Gait & Station: normal Patient leans: N/A  Psychiatric Specialty Exam: Physical Exam  Constitutional: She is oriented to person, place, and time. She appears well-developed and well-nourished.  HENT:  Head: Normocephalic and atraumatic.  Respiratory: Effort normal.  Neurological: She is alert and oriented to person, place, and time.  Psychiatric:  As above     ROS  Blood pressure 140/90, pulse (!) 111, temperature 98.9 F (37.2 C), temperature source Oral, resp. rate 12, height 5\' 4"  (1.626 m), weight 98.9 kg (218 lb).Body mass index is 37.42 kg/m.  General Appearance: Casually dressed, much calmer today. No tremors, not drooling. Appropriate behavior.   Eye Contact:  Good  Speech:  Less pressured  Volume:  Normal  Mood:  Feels better  Affect:  Appropriate and Full Range  Thought Process:  Linear  Orientation:  Full (Time, Place, and Person)  Thought Content:  Future oriented. No more preoccupied with violent themes. No delusional theme.  No obsession.  No hallucination in any modality.   Suicidal Thoughts:  No  Homicidal Thoughts:  No  Memory:  Immediate;   Good Recent;   Good Remote;   Good  Judgement:  Good  Insight:  Good  Psychomotor Activity:  Normal  Concentration:  Concentration: Good and Attention Span: Good  Recall:  Good  Fund of Knowledge:  Good  Language:  Good  Akathisia:  Negative  Handed:    AIMS (if indicated):     Assets:  Communication Skills Desire for Improvement Housing Intimacy Physical  Health Resilience  ADL's:  Intact  Cognition:  WNL  Sleep:  Number of Hours: 5.5     Treatment Plan Summary: Patient is tolerating recent medication adjustment well. Her mood is gradually stabilizing. Preoccupation with violent thoughts is easing off. She is now able to tolerate the milieu more. We plan to continue her medications at current dose. We would evaluate her further.  Psychiatric: Bipolar Disorder  Medical: Chronic pain Obesity  Psychosocial:   PLAN: 1. Continue current regimen 2. Continue to monitor mood, behavior and interaction with peers 3. Continue to encourage unit groups and therapeutic activity    Artist [place], MD 11/06/2017, 10:26 AM
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
## 2017-11-07 DIAGNOSIS — G4700 Insomnia, unspecified: Secondary | ICD-10-CM

## 2017-11-07 DIAGNOSIS — Z87891 Personal history of nicotine dependence: Secondary | ICD-10-CM

## 2017-11-07 DIAGNOSIS — F1099 Alcohol use, unspecified with unspecified alcohol-induced disorder: Secondary | ICD-10-CM

## 2017-11-07 DIAGNOSIS — F419 Anxiety disorder, unspecified: Secondary | ICD-10-CM

## 2017-11-07 DIAGNOSIS — R451 Restlessness and agitation: Secondary | ICD-10-CM

## 2017-11-07 MED ORDER — DIPHENHYDRAMINE HCL 50 MG PO CAPS
50.0000 mg | ORAL_CAPSULE | Freq: Once | ORAL | Status: AC
  Administered 2017-11-07: 50 mg via ORAL
  Filled 2017-11-07: qty 2
  Filled 2017-11-07: qty 1

## 2017-11-07 MED ORDER — ONDANSETRON 4 MG PO TBDP
4.0000 mg | ORAL_TABLET | Freq: Once | ORAL | Status: AC
  Administered 2017-11-07: 4 mg via ORAL
  Filled 2017-11-07 (×2): qty 1

## 2017-11-07 MED ORDER — TRAMADOL HCL 50 MG PO TABS
50.0000 mg | ORAL_TABLET | Freq: Once | ORAL | Status: AC
  Administered 2017-11-07: 50 mg via ORAL
  Filled 2017-11-07: qty 1

## 2017-11-07 NOTE — Tx Team (Signed)
Interdisciplinary Treatment and Diagnostic Plan Update  11/07/2017 Time of Session: [place] MRN: [id number]  Principal Diagnosis: Bipolar disorder, unspecified ([place])  Secondary Diagnoses: Principal Problem:   Bipolar disorder, unspecified ([name])   Current Medications:  Current Facility-Administered Medications  Medication Dose Route Frequency Provider Last Rate Last Dose  . acetaminophen (TYLENOL) tablet 650 mg  650 mg Oral Q6H PRN Lindon Romp A, NP      . alum & mag hydroxide-simeth (MAALOX/MYLANTA) 200-200-20 MG/5ML suspension 30 mL  30 mL Oral Q4H PRN Lindon Romp A, NP      . gabapentin (NEURONTIN) capsule 300 mg  300 mg Oral TID Lindon Romp A, NP   300 mg at 11/07/17 0819  . hydrOXYzine (ATARAX/VISTARIL) tablet 50 mg  50 mg Oral TID PRN Rozetta Nunnery, NP   50 mg at 11/06/17 2010  . levonorgestrel-ethinyl estradiol (SEASONALE,INTROVALE,JOLESSA) 0.15-0.03 MG per tablet 1 tablet  1 tablet Oral QHS Lindon Romp A, NP   1 tablet at 11/06/17 2155  . magnesium hydroxide (MILK OF MAGNESIA) suspension 30 mL  30 mL Oral Daily PRN Lindon Romp A, NP      . methocarbamol (ROBAXIN) tablet 750 mg  750 mg Oral BID PRN Lindon Romp A, NP   750 mg at 11/07/17 0608  . naproxen (NAPROSYN) tablet 500 mg  500 mg Oral BID PRN Lindon Romp A, NP   500 mg at 11/07/17 0245  . risperiDONE (RISPERDAL) tablet 1 mg  1 mg Oral QHS Izediuno, Laruth Bouchard, MD   1 mg at 11/06/17 2155  . topiramate (TOPAMAX) tablet 50 mg  50 mg Oral QHS Lindon Romp A, NP   50 mg at 11/06/17 2155  . traZODone (DESYREL) tablet 50 mg  50 mg Oral QHS Lindon Romp A, NP   50 mg at 11/05/17 2157  . valACYclovir (VALTREX) tablet 500 mg  500 mg Oral QHS Lindon Romp A, NP   500 mg at 11/06/17 2155   PTA Medications: Medications Prior to Admission  Medication Sig Dispense Refill Last Dose  . gabapentin (NEURONTIN) 300 MG capsule Take 1 capsule (300 mg total) by mouth 3 (three) times daily. 90 capsule 3 11/04/2017  . hydrOXYzine  (VISTARIL) 50 MG capsule Take 1 capsule (50 mg total) by mouth 3 (three) times daily as needed. (Patient taking differently: Take 50 mg by mouth 3 (three) times daily as needed for anxiety. ) 30 capsule 0 11/03/2017  . JOLESSA 0.15-0.03 MG tablet TAKE ONE TABLET BY MOUTH ONCE DAILY (Patient taking differently: TAKE ONE TABLET BY MOUTH AT BEDTIME.) 91 tablet 4 11/03/2017  . LORazepam (ATIVAN) 1 MG tablet Take 1 mg by mouth daily.     . methocarbamol (ROBAXIN) 750 MG tablet Take 1 tablet (750 mg total) by mouth 2 (two) times daily as needed for muscle spasms. 60 tablet 1 11/04/2017  . naproxen sodium (ALEVE) 220 MG tablet Take 660 mg by mouth 2 (two) times daily as needed (For headache.).   11/03/2017  . prochlorperazine (COMPAZINE) 10 MG tablet Take 10 mg by mouth every 6 (six) hours as needed for nausea or vomiting.   11/04/2017  . topiramate (TOPAMAX) 50 MG tablet Take 1 tablet (50 mg total) by mouth at bedtime. 30 tablet 2 11/03/2017  . traZODone (DESYREL) 50 MG tablet Take 1 tablet (50 mg total) by mouth at bedtime. 30 tablet 0 not started yet  . valACYclovir (VALTREX) 500 MG tablet TAKE ONE TABLET BY MOUTH ONCE DAILY (Patient taking differently:  TAKE ONE TABLET BY MOUTH AT BEDTIME.) 30 tablet 5 11/03/2017  . venlafaxine XR (EFFEXOR-XR) 150 MG 24 hr capsule Take 1 capsule (150 mg total) by mouth at bedtime. 30 capsule 0 11/03/2017  . venlafaxine XR (EFFEXOR-XR) 75 MG 24 hr capsule Take 1 capsule (75 mg total) by mouth every morning. 30 capsule 1 11/04/2017    Patient Stressors: Marital or family conflict Medication change or noncompliance  Patient Strengths: Ability for insight Average or above average intelligence Capable of independent living Communication skills General fund of knowledge Motivation for treatment/growth  Treatment Modalities: Medication Management, Group therapy, Case management,  1 to 1 session with clinician, Psychoeducation, Recreational therapy.   Physician Treatment Plan for  Primary Diagnosis: Bipolar disorder, unspecified ([name]) Long Term Goal(s): Improvement in symptoms so as ready for discharge Improvement in symptoms so as ready for discharge   Short Term Goals: Ability to identify changes in lifestyle to reduce recurrence of condition will improve Ability to verbalize feelings will improve Ability to disclose and discuss suicidal ideas Ability to demonstrate self-control will improve Ability to identify and develop effective coping behaviors will improve Ability to maintain clinical measurements within normal limits will improve Compliance with prescribed medications will improve Ability to identify triggers associated with substance abuse/mental health issues will improve Ability to identify changes in lifestyle to reduce recurrence of condition will improve Ability to verbalize feelings will improve Ability to disclose and discuss suicidal ideas Ability to demonstrate self-control will improve Ability to identify and develop effective coping behaviors will improve Ability to maintain clinical measurements within normal limits will improve Compliance with prescribed medications will improve Ability to identify triggers associated with substance abuse/mental health issues will improve  Medication Management: Evaluate patient's response, side effects, and tolerance of medication regimen.  Therapeutic Interventions: 1 to 1 sessions, Unit Group sessions and Medication administration.  Evaluation of Outcomes: Progressing  Physician Treatment Plan for Secondary Diagnosis: Principal Problem:   Bipolar disorder, unspecified ([name])  Long Term Goal(s): Improvement in symptoms so as ready for discharge Improvement in symptoms so as ready for discharge   Short Term Goals: Ability to identify changes in lifestyle to reduce recurrence of condition will improve Ability to verbalize feelings will improve Ability to disclose and discuss suicidal ideas Ability to  demonstrate self-control will improve Ability to identify and develop effective coping behaviors will improve Ability to maintain clinical measurements within normal limits will improve Compliance with prescribed medications will improve Ability to identify triggers associated with substance abuse/mental health issues will improve Ability to identify changes in lifestyle to reduce recurrence of condition will improve Ability to verbalize feelings will improve Ability to disclose and discuss suicidal ideas Ability to demonstrate self-control will improve Ability to identify and develop effective coping behaviors will improve Ability to maintain clinical measurements within normal limits will improve Compliance with prescribed medications will improve Ability to identify triggers associated with substance abuse/mental health issues will improve     Medication Management: Evaluate patient's response, side effects, and tolerance of medication regimen.  Therapeutic Interventions: 1 to 1 sessions, Unit Group sessions and Medication administration.  Evaluation of Outcomes: Progressing   RN Treatment Plan for Primary Diagnosis: Bipolar disorder, unspecified ([place]) Long Term Goal(s): Knowledge of disease and therapeutic regimen to maintain health will improve  Short Term Goals: Ability to remain free from injury will improve, Ability to verbalize frustration and anger appropriately will improve, Ability to demonstrate self-control, Ability to participate in decision making will improve, Ability to verbalize  feelings will improve, Ability to disclose and discuss suicidal ideas, Ability to identify and develop effective coping behaviors will improve and Compliance with prescribed medications will improve  Medication Management: RN will administer medications as ordered by provider, will assess and evaluate patient's response and provide education to patient for prescribed medication. RN will report any  adverse and/or side effects to prescribing provider.  Therapeutic Interventions: 1 on 1 counseling sessions, Psychoeducation, Medication administration, Evaluate responses to treatment, Monitor vital signs and CBGs as ordered, Perform/monitor CIWA, COWS, AIMS and Fall Risk screenings as ordered, Perform wound care treatments as ordered.  Evaluation of Outcomes: Progressing   LCSW Treatment Plan for Primary Diagnosis: Bipolar disorder, unspecified ([place]) Long Term Goal(s): Safe transition to appropriate next level of care at discharge, Engage patient in therapeutic group addressing interpersonal concerns.  Short Term Goals: Engage patient in aftercare planning with referrals and resources, Increase social support, Increase ability to appropriately verbalize feelings, Increase emotional regulation, Facilitate acceptance of mental health diagnosis and concerns, Facilitate patient progression through stages of change regarding substance use diagnoses and concerns, Identify triggers associated with mental health/substance abuse issues and Increase skills for wellness and recovery  Therapeutic Interventions: Assess for all discharge needs, 1 to 1 time with Social worker, Explore available resources and support systems, Assess for adequacy in community support network, Educate family and significant other(s) on suicide prevention, Complete Psychosocial Assessment, Interpersonal group therapy.  Evaluation of Outcomes: Progressing   Progress in Treatment: Attending groups: Yes. Participating in groups: Yes. Taking medication as prescribed: Yes. Toleration medication: Yes. Family/Significant other contact made: No, will contact:  the patient's husband Patient understands diagnosis: Yes. Discussing patient identified problems/goals with staff: Yes. Medical problems stabilized or resolved: Yes. Denies suicidal/homicidal ideation: Yes. Issues/concerns per patient self-inventory: No. Other:   New  problem(s) identified: None  New Short Term/Long Term Goal(s):medication stabilization, elimination of SI thoughts, development of comprehensive mental wellness plan.   Patient Goal: "To get fixed because my anxiety and depression was out of control"  Discharge Plan or Barriers: Patient will return home with her husband and follow up with Dr. Toy Care for medication management and Aquilla Solian for therapy services at discharge.   Reason for Continuation of Hospitalization: Anxiety Depression Medication stabilization  Estimated Length of Stay: Wednesday, 11/09/17  Attendees: Patient: Gina James 11/07/2017 8:31 AM  Physician: Dr. Leanord Hawking 11/07/2017 8:31 AM  Nursing: Jinny Sanders RN 11/07/2017 8:31 AM  RN Care Manager: Rhunette Croft 11/07/2017 8:31 AM  Social Worker: [name], [place] 11/07/2017 8:31 AM  Recreational Therapist: Rhunette Croft 11/07/2017 8:31 AM  Other:  11/07/2017 8:31 AM  Other:  11/07/2017 8:31 AM  Other: 11/07/2017 8:31 AM    Scribe for Treatment Team: Marylee Floras, [place] 11/07/2017 8:31 AM

## 2017-11-07 NOTE — Progress Notes (Signed)
Recreation Therapy Notes  Date: 11/07/17 Time: 0930 Location: [address] Dayroom  Group Topic: Stress Management  Goal Area(s) Addresses:  Patient will verbalize importance of using healthy stress management.  Patient will identify positive emotions associated with healthy stress management.   Intervention: Stress Management  Activity :  Guided Imagery.  LRT introduced the stress management technique of guided imagery.  LRT read a script that allowed patients to envision being on the beach.  Patients were to follow along as the script was read to engage in the activity.  Education:  Stress Management, Discharge Planning.   Education Outcome: Acknowledges edcuation/In group clarification offered/Needs additional education  Clinical Observations/Feedback: Pt did not attend group.     Victorino Sparrow, LRT/CTRS         Ria Comment, Keny Donald A 11/07/2017 11:08 AM

## 2017-11-07 NOTE — Progress Notes (Addendum)
D: Patient observed visible and active in the milieu. Interacting appropriate with peers. No outbursts. Patient states "I just can't believe how much better I'm doing. To go from wanting to kill myself and telling everyone in group I wanted to hurt them. I just feel so much like a different person." Patient's affect anxious with pleasant mood. Per self inventory and discussions with writer, rates depression at a 1/10, hopelessness at a 1/10 and anxiety at a 2/10. Rates sleep as poor, appetite as good, energy as normal and concentration as good.  States goal for today is to "have a helpful community or person when I leave here." States headache that she had overnight is improved and has been mild this evening. No other physical complaints.   A: Medicated per orders, no prns requested or requried. Level III obs in place for safety. Emotional support offered and self inventory reviewed. Encouraged completion of Suicide Safety Plan and programming participation. Discussed POC with MD, SW.    R: Patient verbalizes understanding of POC. Patient denies SI/HI/AVH and remains safe on level III obs. Will continue to monitor closely and make verbal contact frequently.

## 2017-11-07 NOTE — Progress Notes (Signed)
[facility] MD Progress Note  11/07/2017 1:41 PM Gina James  MRN:  [id number]   Subjective:  Patient reports that she is feeling really good today. She reports feeling happy, denies any SI/HI/AVh and contracts for safety. "It's great I feel chill and I don't want to kill anyone or myself." She reports that she had a headache last nigh, but felt it may have been from stopping the Effexor-XR quickly. She has concern about her husband not being supportive, "He doesn't think my diagnosis is right, so he won't tell the family."   Objective: 37y.o Caucasian female, married, lives with her husband and two kids. Background history ofMDD, anxiety disorder and chronic pain. Presented to the [employer] of her family.She reports that her antidepressant was switched two weeks ago from Venlafaxine to Vortioxetine. Says since then she has been having thoughts of rage. She feels like running people over with her car. She has been having thoughts of harm towards her husband. She has been having passive death wish but no actual suicidal thoughts. She has been hearing voices stating that people are out to get her. Routine labsshows increased lipid panels. Other parameters are essentially normal. Toxicology is negative. UDS ispositive for benzodiazepines and THC. BAL5mg /dl.   Patient's chart and findings reviewed and discussed with treatment team. Patient presents in the day room interacting appropriately. She presents pleasant and cooperative. Will continue current medications. Will encourage her and her husband to go to therapy together.   Principal Problem: Bipolar disorder, unspecified ([place]) Diagnosis:   Patient Active Problem List   Diagnosis Date Noted  . Bipolar disorder, unspecified ([place]) [F31.9] 11/04/2017  . Cervical disc disorder with radiculopathy of cervical region [M50.10] 10/21/2017  . S/P spinal surgery [Z98.890] 08/12/2017  . DDD (degenerative disc disease), lumbar [M51.36] 01/13/2017  .  Depression with anxiety [F41.8] 08/04/2016  . 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 PMDD (premenstrual dysphoric disorder) [F32.81] 07/29/2015  . Preventative health care [Z00.00] 10/29/2014  . Obesity [E66.9] 06/10/2014  . 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 Lumbar pain with radiation down both legs [M54.5] 09/11/2013  . Generalized anxiety disorder [F41.1] 06/18/2013   Total Time spent with patient: 15 minutes  Past Psychiatric History: See H&P  Past Medical History:  Past Medical History:  Diagnosis Date  . Ankle fracture, right   . Anxiety   . Arthritis    "in my back" (04/08/2015)  . Chronic lower back pain   . Complication of anesthesia    difficulty urinating after anesthesia  . Headache    "monthly" (04/08/2015)  . Post partum depression     Past Surgical History:  Procedure Laterality Date  . ANTERIOR FUSION LUMBAR SPINE  07/06/2017  . BREAST SURGERY    . CESAREAN SECTION  2007 and 2009  . COMBINED ABDOMINOPLASTY AND LIPOSUCTION  12/2014   "liposuction in thighs"  . FRACTURE SURGERY    . LAPAROSCOPIC ABDOMINAL EXPLORATION  1997   "to check for endometriosis"  . LAPAROSCOPIC CHOLECYSTECTOMY  2007  . OPEN REDUCTION INTERNAL FIXATION (ORIF) TIBIA/FIBULA FRACTURE Right 04/08/2015   Procedure: OPEN REDUCTION INTERNAL FIXATION (ORIF)  RIGHT TIBIA PILON FRACTURE;  Surgeon: Wylene Simmer, MD;  Location: [facility];  Service: Orthopedics;  Laterality: Right;  . TIBIA FRACTURE SURGERY Right 04/08/2015   ORIF tibial pilon   Family History:  Family History  Problem Relation Age of Onset  . Diabetes Father   . Cancer Sister 52  breast, had chemo/double mastectomy  . Heart disease Maternal Grandfather   . Heart disease Paternal Grandfather    Family Psychiatric  History: See H&P Social History:  Social History   Substance and Sexual Activity  Alcohol Use Yes  . Alcohol/week: 2.4 oz  . Types: 4 Cans of beer per week   Comment:  04/08/2015 "drink only the weekends"     Social History   Substance and Sexual Activity  Drug Use No    Social History   Socioeconomic History  . Marital status: Married    Spouse name: None  . Number of children: None  . Years of education: None  . Highest education level: None  Social Needs  . Financial resource strain: None  . Food insecurity - worry: None  . Food insecurity - inability: None  . Transportation needs - medical: None  . Transportation needs - non-medical: None  Occupational History  . None  Tobacco Use  . Smoking status: Former Smoker    Packs/day: 0.12    Years: 10.00    Pack years: 1.20    Types: Cigarettes    Last attempt to quit: 09/06/2004    Years since quitting: 13.1  . Smokeless tobacco: Never Used  Substance and Sexual Activity  . Alcohol use: Yes    Alcohol/week: 2.4 oz    Types: 4 Cans of beer per week    Comment: 04/08/2015 "drink only the weekends"  . Drug use: No  . Sexual activity: Yes    Comment: vasectomy in partner  Other Topics Concern  . 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       Additional Social History:                         Sleep: Good  Appetite:  Good  Current Medications: Current Facility-Administered Medications  Medication Dose Route Frequency Provider Last Rate Last Dose  . acetaminophen (TYLENOL) tablet 650 mg  650 mg Oral Q6H PRN Lindon Romp A, NP      . alum & mag hydroxide-simeth (MAALOX/MYLANTA) 200-200-20 MG/5ML suspension 30 mL  30 mL Oral Q4H PRN Lindon Romp A, NP      . gabapentin (NEURONTIN) capsule 300 mg  300 mg Oral TID Lindon Romp A, NP   300 mg at 11/07/17 1159  . hydrOXYzine (ATARAX/VISTARIL) tablet 50 mg  50 mg Oral TID PRN Rozetta Nunnery, NP   50 mg at 11/06/17 2010  . levonorgestrel-ethinyl estradiol  (SEASONALE,INTROVALE,JOLESSA) 0.15-0.03 MG per tablet 1 tablet  1 tablet Oral QHS Lindon Romp A, NP   1 tablet at 11/06/17 2155  . magnesium hydroxide (MILK OF MAGNESIA) suspension 30 mL  30 mL Oral Daily PRN Lindon Romp A, NP      . methocarbamol (ROBAXIN) tablet 750 mg  750 mg Oral BID PRN Lindon Romp A, NP   750 mg at 11/07/17 0608  . naproxen (NAPROSYN) tablet 500 mg  500 mg Oral BID PRN Lindon Romp A, NP   500 mg at 11/07/17 0245  . risperiDONE (RISPERDAL) tablet 1 mg  1 mg Oral QHS Izediuno, Laruth Bouchard, MD   1 mg at 11/06/17 2155  . topiramate (TOPAMAX) tablet 50 mg  50 mg Oral QHS Rozetta Nunnery,  NP   50 mg at 11/06/17 2155  . traZODone (DESYREL) tablet 50 mg  50 mg Oral QHS Lindon Romp A, NP   50 mg at 11/05/17 2157  . valACYclovir (VALTREX) tablet 500 mg  500 mg Oral QHS Lindon Romp A, NP   500 mg at 11/06/17 2155    Lab Results: No results found for this or any previous visit (from the past 15 hour(s)).  Blood Alcohol level:  Lab Results  Component Value Date   ETH <10 11/04/2017   ETH <10 85/46/2703    Metabolic Disorder Labs: Lab Results  Component Value Date   HGBA1C 4.9 11/05/2017   MPG 93.93 11/05/2017   No results found for: PROLACTIN Lab Results  Component Value Date   CHOL 229 (H) 11/05/2017   TRIG 196 (H) 11/05/2017   HDL 45 11/05/2017   CHOLHDL 5.1 11/05/2017   VLDL 39 11/05/2017   LDLCALC 145 (H) 11/05/2017   LDLCALC 87 10/28/2014    Physical Findings: AIMS: Facial and Oral Movements Muscles of Facial Expression: None, normal Lips and Perioral Area: None, normal Jaw: None, normal Tongue: None, normal,Extremity Movements Upper (arms, wrists, hands, fingers): None, normal Lower (legs, knees, ankles, toes): None, normal, Trunk Movements Neck, shoulders, hips: None, normal, Overall Severity Severity of abnormal movements (highest score from questions above): None, normal Incapacitation due to abnormal movements: None, normal Patient's awareness of  abnormal movements (rate only patient's report): No Awareness, Dental Status Current problems with teeth and/or dentures?: No Does patient usually wear dentures?: No  CIWA:    COWS:     Musculoskeletal: Strength & Muscle Tone: within normal limits Gait & Station: normal Patient leans: N/A  Psychiatric Specialty Exam: Physical Exam  Nursing note and vitals reviewed. Constitutional: She is oriented to person, place, and time. She appears well-developed and well-nourished.  Respiratory: Effort normal.  Musculoskeletal: Normal range of motion.  Neurological: She is alert and oriented to person, place, and time.  Skin: Skin is warm.    Review of Systems  Constitutional: Negative.   HENT: Negative.   Eyes: Negative.   Respiratory: Negative.   Cardiovascular: Negative.   Gastrointestinal: Negative.   Genitourinary: Negative.   Musculoskeletal: Negative.   Skin: Negative.   Neurological: Negative.   Endo/Heme/Allergies: Negative.   Psychiatric/Behavioral: Negative.     Blood pressure (!) 145/98, pulse (!) 123, temperature 98.4 F (36.9 C), temperature source Oral, resp. rate 16, height 5\' 4"  (1.626 m), weight 98.9 kg (218 lb).Body mass index is 37.42 kg/m.  General Appearance: Casual  Eye Contact:  Good  Speech:  Clear and Coherent and Normal Rate  Volume:  Normal  Mood:  Euthymic  Affect:  Congruent  Thought Process:  Goal Directed and Descriptions of Associations: Intact  Orientation:  Full (Time, Place, and Person)  Thought Content:  WDL  Suicidal Thoughts:  No  Homicidal Thoughts:  No  Memory:  Immediate;   Good Recent;   Good Remote;   Good  Judgement:  Good  Insight:  Good  Psychomotor Activity:  Normal  Concentration:  Concentration: Good and Attention Span: Good  Recall:  Good  Fund of Knowledge:  Good  Language:  Good  Akathisia:  No  Handed:  Right  AIMS (if indicated):     Assets:  Communication Skills Desire for Improvement Financial  Resources/Insurance Housing Physical Health Social Support Transportation  ADL's:  Intact  Cognition:  WNL  Sleep:  Number of Hours: 5.5   Problems Addressed: Bipolar Unspecified  Treatment Plan Summary: Daily contact with patient to assess and evaluate symptoms and progress in treatment, Medication management and Plan is to:  -Continue Gabapentin 300 mg PO TID for neuropathic pain and agitation -Continue Risperidone 1 mg PO QHS for mood stability -Continue Vistaril 50 mg PO TID fo anxiety -Continue Topamax 50 mg PO Daily  -Continue Trazodone 50 mg PO QHS PRN for insomnia -Encourage group therapy participation  Lewis Shock, [place] 11/07/2017, 1:41 PM

## 2017-11-07 NOTE — Plan of Care (Signed)
Patient verbalizes understanding of information, education provided. 

## 2017-11-07 NOTE — BHH Group Notes (Signed)
LCSW Group Therapy Note 11/07/2017 3:15 PM  Type of Therapy and Topic: Group Therapy: Overcoming Obstacles  Participation Level: Active  Description of Group:  In this group patients will be encouraged to explore what they see as obstacles to their own wellness and recovery. They will be guided to discuss their thoughts, feelings, and behaviors related to these obstacles. The group will process together ways to cope with barriers, with attention given to specific choices patients can make. Each patient will be challenged to identify changes they are motivated to make in order to overcome their obstacles. This group will be process-oriented, with patients participating in exploration of their own experiences as well as giving and receiving support and challenge from other group members.  Therapeutic Goals: 1. Patient will identify personal and current obstacles as they relate to admission. 2. Patient will identify barriers that currently interfere with their wellness or overcoming obstacles.  3. Patient will identify feelings, thought process and behaviors related to these barriers. 4. Patient will identify two changes they are willing to make to overcome these obstacles:   Summary of Patient Progress  Caragh was engaged throughout and participated in today's group session. She contributed to the group's discussion and stated that her current obstacle was dealing with an unsupportive husband. Piccola states that she plans to have a discussion with the [facility] peer support specialist regarding her home situation, but she does not know how she will overcome this obstacle as of yet.    Therapeutic Modalities:  Cognitive Behavioral Therapy Solution Focused Therapy Motivational Interviewing Relapse Prevention Therapy   Theresa Duty Clinical Social Worker

## 2017-11-07 NOTE — BHH Group Notes (Signed)
[place] Group Notes:  (Nursing/MHT/Case Management/Adjunct)  Date:  11/07/2017  Time:  1030  Type of Therapy:  Nurse Education - Self Care  Participation Level:  Active  Participation Quality:  Appropriate  Affect:  Appropriate  Cognitive:  Alert  Insight:  Improving  Engagement in Group:  Engaged  Modes of Intervention:  Discussion, Education and Support  Summary of Progress/Problems: Patient was attentive and participative in group. Identified interpersonal relationships is an area of improvement  Jamie Kato 11/07/2017, 11:18 AM

## 2017-11-07 NOTE — Progress Notes (Signed)
Patient ID: Gina James, female   DOB: 1980/12/02, 37 y.o.   MRN: [id number]  Pt currently presents with an anxious affect and labile behavior. Pt becomes easily agitated tonight. Confused about the indications of her medications. After becoming agitated patient states, "Sorry, sometimes that happens." Pt reports to Probation officer that their goal is to "talk to my CSW." Pt states "I want to set up support systems outside of here, my husband is also embarrassed of my treatment so I would like someone to speak with him." Pt reports good sleep with current medication regimen.   Pt provided with medications per providers orders. Pt's labs and vitals were monitored throughout the night. Pt given a 1:1 about emotional and mental status. Pt supported and encouraged to express concerns and questions. Pt educated on medications (specifically Trazodone and Topamax), indications and side effects.   Pt's safety ensured with 15 minute and environmental checks. Pt currently denies SI/HI and A/V hallucinations. Pt verbally agrees to seek staff if SI/HI or A/VH occurs and to consult with staff before acting on any harmful thoughts. Will continue POC.

## 2017-11-07 NOTE — BHH Suicide Risk Assessment (Signed)
[place] INPATIENT:  Family/Significant Other Suicide Prevention Education  Suicide Prevention Education:  Education Completed; Tanaka Gillen (husband, [phone])  has been identified by the patient as the family member/significant other with whom the patient will be residing, and identified as the person(s) who will aid the patient in the event of a mental health crisis (suicidal ideations/suicide attempt).  With written consent from the patient, the family member/significant other has been provided the following suicide prevention education, prior to the and/or following the discharge of the patient.  The suicide prevention education provided includes the following:  Suicide risk factors  Suicide prevention and interventions  National Suicide Hotline telephone number  [facility] assessment telephone number  [facility] Emergency Assistance [place] and/or Residential Mobile Crisis Unit telephone number  Request made of family/significant other to:  Remove weapons (e.g., guns, rifles, knives), all items previously/currently identified as safety concern.    Remove drugs/medications (over-the-counter, prescriptions, illicit drugs), all items previously/currently identified as a safety concern.  The family member/significant other verbalizes understanding of the suicide prevention education information provided.  The family member/significant other agrees to remove the items of safety concern listed above.  Marylee Floras 11/07/2017, 3:58 PM

## 2017-11-07 NOTE — Progress Notes (Signed)
Adult Psychoeducational Group Note  Date:  11/07/2017 Time:  8:36 PM  Group Topic/Focus:  Wrap-Up Group:   The focus of this group is to help patients review their daily goal of treatment and discuss progress on daily workbooks.  Participation Level:  Active  Participation Quality:  Appropriate  Affect:  Appropriate  Cognitive:  Appropriate  Insight: Appropriate  Engagement in Group:  Engaged  Modes of Intervention:  Discussion  Additional Comments:  The patient expressed that she rates today a 9.The patient also said that she enjoyed groups today.  Nash Shearer 11/07/2017, 8:36 PM

## 2017-11-08 MED ORDER — LORAZEPAM 0.5 MG PO TABS: 0.5000 mg | ORAL_TABLET | Freq: Four times a day (QID) | ORAL | Status: DC | PRN

## 2017-11-08 MED ORDER — LORAZEPAM 1 MG PO TABS
1.0000 mg | ORAL_TABLET | Freq: Four times a day (QID) | ORAL | Status: DC | PRN
  Administered 2017-11-08 – 2017-11-09 (×2): 1 mg via ORAL
  Filled 2017-11-08: qty 1

## 2017-11-08 MED ORDER — ONDANSETRON 4 MG PO TBDP
4.0000 mg | ORAL_TABLET | Freq: Three times a day (TID) | ORAL | Status: DC | PRN
  Administered 2017-11-09: 4 mg via ORAL
  Filled 2017-11-08: qty 1

## 2017-11-08 MED ORDER — VENLAFAXINE HCL 37.5 MG PO TABS
37.5000 mg | ORAL_TABLET | Freq: Two times a day (BID) | ORAL | Status: DC
  Administered 2017-11-08 – 2017-11-09 (×2): 37.5 mg via ORAL
  Filled 2017-11-08 (×3): qty 1

## 2017-11-08 MED ORDER — VENLAFAXINE HCL 37.5 MG PO TABS
ORAL_TABLET | ORAL | Status: AC
  Filled 2017-11-08: qty 1

## 2017-11-08 MED ORDER — LORAZEPAM 1 MG PO TABS
ORAL_TABLET | ORAL | Status: AC
  Filled 2017-11-08: qty 1

## 2017-11-08 NOTE — Progress Notes (Signed)
Adult Psychoeducational Group Note  Date:  11/08/2017 Time:  11:24 PM  Group Topic/Focus:  Wrap-Up Group:   The focus of this group is to help patients review their daily goal of treatment and discuss progress on daily workbooks.  Participation Level:  Active  Participation Quality:  Attentive  Affect:  Appropriate  Cognitive:  Alert and Appropriate  Insight: Good  Engagement in Group:  Engaged  Modes of Intervention:  Discussion, Education, Socialization and Support  Additional Comments:  Pt rated her day at a 3 out of 10, explaining she has been dealing with withdrawals throughout the day. Pt stated she had good conversations with her nurse and received information about resources that will support her when she leaves the hospital.   Gina James 11/08/2017, 11:24 PM

## 2017-11-08 NOTE — Progress Notes (Addendum)
Patient is very distraught and is tearful, loud, and agitated. Patient hen details that she was on a total of 225 mg of Effexor a day and was started on Trintellix 10 mg Daily along with it. She then reports that with both medications on board is when she had these episodes. Will start back immediate Effexor 37.5 mg BID. Patient may be experiencing withdrawal and severe mood swings for the medication being immediately stopped with no taper.    Agree with NP Progress Note

## 2017-11-08 NOTE — Progress Notes (Signed)
Patient ID: HODA HON, female   DOB: [birth year], 37 y.o.   MRN: [id number]  Pt had a difficult time opening her eyes during morning vitals. Pt reports she was able to walk to the dayroom but "had to hold on the wall." Pt reports drowsiness. Writer encouraged patient to speak with MD about decreasing nighttime medications due to drowsiness, reported Probation officer would tell Public house manager. MHT still collecting patients vitals, all WDL. When writer returned from getting wheelchair to escort patient back to her room, pt starts stating multiple times, "You gave me something. You game me something last night." Pt refers to Topamax as this medication. Writer redirected patient that she did in fact get this medication last night and the three previous nights before that. Pt escorted back to room and assisted to get back into bed. Pt reports intent to sleep. Educated on fall risk precautions and encouraged to press call button if she needs to get up to ambulate and still feels drowsy. Will bring tray back for patient. Continue to monitor.

## 2017-11-08 NOTE — Progress Notes (Signed)
Data. Patient denies SI/HI/AVH. Verbally contracts for safety on the unit and to come to staff before acting of any self harm thoughts/feelings.  Patient has been emotionally labile all shift. At the beginning of shift she reported that she was nauseous and received Mylanta with good result, and did not eat breakfast. She then became very tearful  in the dayroom, and she took significant time to be redirected, but was able to calm down wit staff intervention.  At that time she reported that she was having some concerns about, "All my medications being stopped and I don't want to start threatening to kill people again." Patient also reported, "Head zaps" at that time. NP notified of the head zaps. In the afternoon, she came to the nurses station very upset, yelling and sobbing and demanding, "I want you to let me out right now. No one is listening to me and no one is helping me." Patient reported she was continuing to have the head zaps. NP notified. New orders received and given. Patient was able to be redirected and calm down with staff assist. Action. Emotional support and encouragement offered. Education provided on medication, indications and side effect. Q 15 minute checks done for safety. Response. Safety on the unit maintained through 15 minute checks.  Medications taken as prescribed. Attended groups.

## 2017-11-08 NOTE — Plan of Care (Signed)
  Progressing Safety: Periods of time without injury will increase 11/08/2017 2355 - Progressing by Karie Kirks, RN Note Pt has not harmed self or others tonight.  She denies SI/HI and verbally contracts for safety.

## 2017-11-08 NOTE — BHH Group Notes (Signed)
[place] Group Notes:  (Nursing/MHT/Case Management/Adjunct)  Date:  11/08/2017  Time:  1515  Type of Therapy:  Nurse Education  Participation Level:  Did not attend  Participation Quality:    Affect:    Cognitive:    Insight:    Engagement in Group:    Modes of Intervention:    Summary of Progress/Problems: Patient was invited to attend however patient declined. Patient distressed, tearful and unable to tolerate group.  Loletta Specter [facility] 11/08/2017, 4:18 PM

## 2017-11-08 NOTE — Progress Notes (Addendum)
[facility] MD Progress Note  11/08/2017 1:34 PM Gina James  MRN:  [id number]   Subjective:  Patient states that she feels "Like I was given a tranquilizer last night." She reports that she doesn't feel good and was nauseous this morning.   Objective: 37y.o Caucasian female, married, lives with her husband and two kids. Background history ofMDD, anxiety disorder and chronic pain. Presented to the [employer] of her family.She reports that her antidepressant was switched two weeks ago from Venlafaxine to Vortioxetine. Says since then she has been having thoughts of rage. She feels like running people over with her car. She has been having thoughts of harm towards her husband. She has been having passive death wish but no actual suicidal thoughts. She has been hearing voices stating that people are out to get her. Routine labsshows increased lipid panels. Other parameters are essentially normal. Toxicology is negative. UDS ispositive for benzodiazepines and THC. BAL5mg /dl.   Patient's chart and findings reviewed and discussed with treatment team. Patient is in the day room interacting with peers and staff. She appears flat, depressed, and sluggish. She walks dragging her feet and is slow to move. She becomes tearful just talking about medications. Will stop Risperidone use. Patient will be monitored through the night and re-evaluate in the morning.    Principal Problem: Bipolar disorder, unspecified ([place]) Diagnosis:   Patient Active Problem List   Diagnosis Date Noted  . Bipolar disorder, unspecified ([place]) [F31.9] 11/04/2017  . Cervical disc disorder with radiculopathy of cervical region [M50.10] 10/21/2017  . S/P spinal surgery [Z98.890] 08/12/2017  . DDD (degenerative disc disease), lumbar [M51.36] 01/13/2017  . Depression with anxiety [F41.8] 08/04/2016  . 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 PMDD (premenstrual dysphoric disorder) [F32.81] 07/29/2015  .  Preventative health care [Z00.00] 10/29/2014  . Obesity [E66.9] 06/10/2014  . 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 Lumbar pain with radiation down both legs [M54.5] 09/11/2013  . Generalized anxiety disorder [F41.1] 06/18/2013   Total Time spent with patient: 30 minutes  Past Psychiatric History: See H&P  Past Medical History:  Past Medical History:  Diagnosis Date  . Ankle fracture, right   . Anxiety   . Arthritis    "in my back" (04/08/2015)  . Chronic lower back pain   . Complication of anesthesia    difficulty urinating after anesthesia  . Headache    "monthly" (04/08/2015)  . Post partum depression     Past Surgical History:  Procedure Laterality Date  . ANTERIOR FUSION LUMBAR SPINE  07/06/2017  . BREAST SURGERY    . CESAREAN SECTION  2007 and 2009  . COMBINED ABDOMINOPLASTY AND LIPOSUCTION  12/2014   "liposuction in thighs"  . FRACTURE SURGERY    . LAPAROSCOPIC ABDOMINAL EXPLORATION  1997   "to check for endometriosis"  . LAPAROSCOPIC CHOLECYSTECTOMY  2007  . OPEN REDUCTION INTERNAL FIXATION (ORIF) TIBIA/FIBULA FRACTURE Right 04/08/2015   Procedure: OPEN REDUCTION INTERNAL FIXATION (ORIF)  RIGHT TIBIA PILON FRACTURE;  Surgeon: Wylene Simmer, MD;  Location: [place];  Service: Orthopedics;  Laterality: Right;  . TIBIA FRACTURE SURGERY Right 04/08/2015   ORIF tibial pilon   Family History:  Family History  Problem Relation Age of Onset  . Diabetes Father   . Cancer Sister 30       breast, had chemo/double mastectomy  . Heart disease Maternal Grandfather   . Heart disease Paternal Grandfather    Family Psychiatric  History: See H&P  Social History:  Social History   Substance and Sexual Activity  Alcohol Use Yes  . Alcohol/week: 2.4 oz  . Types: 4 Cans of beer per week   Comment: 04/08/2015 "drink only the weekends"     Social History   Substance and Sexual Activity  Drug Use No    Social History   Socioeconomic History  . Marital status: Married     Spouse name: None  . Number of children: None  . Years of education: None  . Highest education level: None  Social Needs  . Financial resource strain: None  . Food insecurity - worry: None  . Food insecurity - inability: None  . Transportation needs - medical: None  . Transportation needs - non-medical: None  Occupational History  . None  Tobacco Use  . Smoking status: Former Smoker    Packs/day: 0.12    Years: 10.00    Pack years: 1.20    Types: Cigarettes    Last attempt to quit: 09/06/2004    Years since quitting: 13.1  . Smokeless tobacco: Never Used  Substance and Sexual Activity  . Alcohol use: Yes    Alcohol/week: 2.4 oz    Types: 4 Cans of beer per week    Comment: 04/08/2015 "drink only the weekends"  . Drug use: No  . Sexual activity: Yes    Comment: vasectomy in partner  Other Topics Concern  . 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       Additional Social History:                         Sleep: Good  Appetite:  Fair  Current Medications: Current Facility-Administered Medications  Medication Dose Route Frequency Provider Last Rate Last Dose  . acetaminophen (TYLENOL) tablet 650 mg  650 mg Oral Q6H PRN Lindon Romp A, NP      . alum & mag hydroxide-simeth (MAALOX/MYLANTA) 200-200-20 MG/5ML suspension 30 mL  30 mL Oral Q4H PRN Lindon Romp A, NP   30 mL at 11/08/17 0843  . gabapentin (NEURONTIN) capsule 300 mg  300 mg Oral TID Lindon Romp A, NP   300 mg at 11/08/17 0919  . hydrOXYzine (ATARAX/VISTARIL) tablet 50 mg  50 mg Oral TID PRN Rozetta Nunnery, NP   50 mg at 11/07/17 2126  . levonorgestrel-ethinyl estradiol (SEASONALE,INTROVALE,JOLESSA) 0.15-0.03 MG per tablet 1 tablet  1 tablet Oral QHS Lindon Romp A, NP   1 tablet at 11/07/17 2126  . LORazepam (ATIVAN) tablet 0.5  mg  0.5 mg Oral Q6H PRN Money, Lowry Ram, FNP      . magnesium hydroxide (MILK OF MAGNESIA) suspension 30 mL  30 mL Oral Daily PRN Lindon Romp A, NP      . methocarbamol (ROBAXIN) tablet 750 mg  750 mg Oral BID PRN Lindon Romp A, NP   750 mg at 11/07/17 2126  . naproxen (NAPROSYN) tablet 500 mg  500 mg Oral BID PRN Lindon Romp A, NP   500 mg at 11/08/17 1112  . ondansetron (ZOFRAN-ODT) disintegrating tablet 4 mg  4 mg Oral Q8H PRN Money, Darnelle Maffucci B, FNP      . topiramate (TOPAMAX) tablet 50 mg  50 mg Oral QHS Rozetta Nunnery, NP  50 mg at 11/07/17 2126  . traZODone (DESYREL) tablet 50 mg  50 mg Oral QHS Lindon Romp A, NP   50 mg at 11/05/17 2157  . valACYclovir (VALTREX) tablet 500 mg  500 mg Oral QHS Lindon Romp A, NP   500 mg at 11/07/17 2126    Lab Results: No results found for this or any previous visit (from the past 48 hour(s)).  Blood Alcohol level:  Lab Results  Component Value Date   ETH <10 11/04/2017   ETH <10 25/36/6440    Metabolic Disorder Labs: Lab Results  Component Value Date   HGBA1C 4.9 11/05/2017   MPG 93.93 11/05/2017   No results found for: PROLACTIN Lab Results  Component Value Date   CHOL 229 (H) 11/05/2017   TRIG 196 (H) 11/05/2017   HDL 45 11/05/2017   CHOLHDL 5.1 11/05/2017   VLDL 39 11/05/2017   LDLCALC 145 (H) 11/05/2017   LDLCALC 87 10/28/2014    Physical Findings: AIMS: Facial and Oral Movements Muscles of Facial Expression: None, normal Lips and Perioral Area: None, normal Jaw: None, normal Tongue: None, normal,Extremity Movements Upper (arms, wrists, hands, fingers): None, normal Lower (legs, knees, ankles, toes): None, normal, Trunk Movements Neck, shoulders, hips: None, normal, Overall Severity Severity of abnormal movements (highest score from questions above): None, normal Incapacitation due to abnormal movements: None, normal Patient's awareness of abnormal movements (rate only patient's report): No Awareness, Dental  Status Current problems with teeth and/or dentures?: No Does patient usually wear dentures?: No  CIWA:    COWS:     Musculoskeletal: Strength & Muscle Tone: within normal limits Gait & Station: normal Patient leans: N/A  Psychiatric Specialty Exam: Physical Exam  Nursing note and vitals reviewed. Constitutional: She is oriented to person, place, and time. She appears well-developed and well-nourished.  Cardiovascular: Normal rate.  Respiratory: Effort normal.  Musculoskeletal: Normal range of motion.  Neurological: She is alert and oriented to person, place, and time.  Skin: Skin is warm.    Review of Systems  Constitutional: Positive for malaise/fatigue.  HENT: Negative.   Eyes: Negative.   Respiratory: Negative.   Cardiovascular: Negative.   Gastrointestinal: Positive for nausea.  Genitourinary: Negative.   Musculoskeletal: Negative.   Skin: Negative.   Neurological: Negative.   Endo/Heme/Allergies: Negative.   Psychiatric/Behavioral: Positive for depression. Negative for hallucinations, substance abuse and suicidal ideas.    Blood pressure 128/89, pulse 98, temperature 98.7 F (37.1 C), temperature source Oral, resp. rate 18, height 5\' 4"  (1.626 m), weight 98.9 kg (218 lb).Body mass index is 37.42 kg/m.  General Appearance: Disheveled  Eye Contact:  Fair  Speech:  Clear and Coherent and Normal Rate  Volume:  Normal  Mood:  Depressed and Hopeless  Affect:  Depressed, Flat and Tearful  Thought Process:  Goal Directed and Descriptions of Associations: Intact  Orientation:  Full (Time, Place, and Person)  Thought Content:  WDL  Suicidal Thoughts:  No  Homicidal Thoughts:  No  Memory:  Immediate;   Good Recent;   Good Remote;   Good  Judgement:  Good  Insight:  Good  Psychomotor Activity:  Normal  Concentration:  Concentration: Good and Attention Span: Good  Recall:  Good  Fund of Knowledge:  Good  Language:  Good  Akathisia:  No  Handed:  Right  AIMS (if  indicated):     Assets:  Communication Skills Desire for Improvement Financial Resources/Insurance Housing Physical Health Social Support Transportation  ADL's:  Intact  Cognition:  WNL  Sleep:  Number of Hours: 6.75   Problems Addressed: Bipolar Unspecified  Treatment Plan Summary: Daily contact with patient to assess and evaluate symptoms and progress in treatment, Medication management and Plan is to:  -Continue Gabapentin 300 mg PO TID for neuropathic pain and agitation -Stop Risperidone 1 mg PO QHS for mood stability -Continue Vistaril 50 mg PO TID fo anxiety -Start Zofran ODT 4 mg PO Q8H PRN for nausea and vomiting -Start Ativan 0.5 mg PO Q6H PRN for anxiety -Continue Topamax 50 mg PO Daily  -Continue Trazodone 50 mg PO QHS PRN for insomnia -Encourage group therapy participation  Lewis Shock, FNP 11/08/2017, 1:34 PM   Agree with NP Progress Note

## 2017-11-08 NOTE — Progress Notes (Addendum)
D: Pt was in the dayroom upon initial approach.  Pt presents with depressed affect and mood.  She reports she is feeling "better, the brain zaps have gone, nausea is gone, stomach cramps are gone, diarrhea is gone."  Pt reports she was feeling "hot and cold" earlier "like I couldn't control my body temperature."  Her goal is to "sleep."  Pt denies SI/HI, denies hallucinations, denies pain.  Pt has been visible in milieu interacting with peers and staff appropriately.  Pt attended evening group.    A: Introduced self to pt.  Actively listened to pt and offered support and encouragement. Medications offered per order.  All medication reviewed with pt prior to administration and pt verbalized understanding.  Fall prevention techniques reviewed with pt and pt verbalized understanding.  PRN medication administered for muscle spasms.  Q15 minute safety checks maintained.  R: Pt is safe on the unit.  Pt is compliant with medications except for Trazodone, which she refused.  Pt verbally contracts for safety.  Will continue to monitor and assess.

## 2017-11-08 NOTE — [facility] Group Notes (Signed)
LCSW Group Therapy Note 11/08/2017 12:27 PM  Type of Therapy and Topic: Group Therapy: Avoiding Self-Sabotaging and Enabling Behaviors  Participation Level: Active  Description of Group:  In this group, patients will learn how to identify obstacles, self-sabotaging and enabling behaviors, as well as: what are they, why do we do them and what needs these behaviors meet. Discuss unhealthy relationships and how to have positive healthy boundaries with those that sabotage and enable. Explore aspects of self-sabotage and enabling in yourself and how to limit these self-destructive behaviors in everyday life.  Therapeutic Goals: 1. Patient will identify one obstacle that relates to self-sabotage and enabling behaviors 2. Patient will identify one personal self-sabotaging or enabling behavior they did prior to admission 3. Patient will state a plan to change the above identified behavior 4. Patient will demonstrate ability to communicate their needs through discussion and/or role play.   Summary of Patient Progress:  Kimberla was engaged and participated during today's group session. Elizbeth contributed to the group's discussion and stated that self sabotaging to her, meant that you continue to doubt yourself. Dariel states that one of her self sabotaging behaviors was indulging in negative thinking. Dim states that she often has negative thinking which typically "sabotages" her day. Tahliyah states that she is frustrated with the doctor and her current medications, so she does not know how she will avoid this behavior right now.     Therapeutic Modalities:  Cognitive Behavioral Therapy Person-Centered Therapy Motivational Interviewing   [place] Clinical Social Worker

## 2017-11-09 DIAGNOSIS — F39 Unspecified mood [affective] disorder: Secondary | ICD-10-CM

## 2017-11-09 MED ORDER — DIPHENHYDRAMINE HCL 50 MG/ML IJ SOLN: 50.0000 mg | Freq: Four times a day (QID) | INTRAMUSCULAR | Status: DC | PRN

## 2017-11-09 MED ORDER — LORAZEPAM 1 MG PO TABS: 1.0000 mg | ORAL_TABLET | Freq: Four times a day (QID) | ORAL | Status: DC | PRN

## 2017-11-09 MED ORDER — LORAZEPAM 2 MG/ML IJ SOLN: 2.0000 mg | Freq: Four times a day (QID) | INTRAMUSCULAR | Status: DC | PRN

## 2017-11-09 MED ORDER — DIVALPROEX SODIUM ER 250 MG PO TB24
250.0000 mg | ORAL_TABLET | Freq: Every day | ORAL | Status: DC
  Administered 2017-11-09: 250 mg via ORAL
  Filled 2017-11-09 (×4): qty 1

## 2017-11-09 MED ORDER — DIPHENHYDRAMINE HCL 25 MG PO CAPS: 50.0000 mg | ORAL_CAPSULE | Freq: Four times a day (QID) | ORAL | Status: DC | PRN

## 2017-11-09 MED ORDER — HALOPERIDOL 5 MG PO TABS: 5.0000 mg | ORAL_TABLET | Freq: Four times a day (QID) | ORAL | Status: DC | PRN

## 2017-11-09 MED ORDER — VENLAFAXINE HCL ER 75 MG PO CP24
75.0000 mg | ORAL_CAPSULE | Freq: Every day | ORAL | Status: DC
  Administered 2017-11-09 – 2017-11-10 (×2): 75 mg via ORAL
  Filled 2017-11-09 (×5): qty 1

## 2017-11-09 MED ORDER — HALOPERIDOL 5 MG PO TABS
ORAL_TABLET | ORAL | Status: AC
  Administered 2017-11-09: 5 mg
  Filled 2017-11-09: qty 1

## 2017-11-09 MED ORDER — LORAZEPAM 1 MG PO TABS: 2.0000 mg | ORAL_TABLET | Freq: Four times a day (QID) | ORAL | Status: DC | PRN

## 2017-11-09 MED ORDER — LORAZEPAM 2 MG/ML IJ SOLN: 1.0000 mg | Freq: Four times a day (QID) | INTRAMUSCULAR | Status: DC | PRN

## 2017-11-09 MED ORDER — HALOPERIDOL LACTATE 5 MG/ML IJ SOLN: 5.0000 mg | Freq: Four times a day (QID) | INTRAMUSCULAR | Status: DC | PRN

## 2017-11-09 NOTE — Progress Notes (Signed)
Pt observed in the dayroom engaging with peers. Pt behaviors are appropriate. No concerns verbalized by pt. Sitter present in dayroom with pt for safety. Pt remains on 1:1 observation for safety per NP.

## 2017-11-09 NOTE — [facility] Group Notes (Signed)
Adult Psychoeducational Group Note  Date:  11/09/2017 Time:  11:32 PM  Group Topic/Focus:  Wrap-Up Group:   The focus of this group is to help patients review their daily goal of treatment and discuss progress on daily workbooks.  Participation Level:  Active  Participation Quality:  Appropriate  Affect:  Flat  Cognitive:  Alert and Oriented  Insight: Appropriate  Engagement in Group:  Engaged  Modes of Intervention:  Exploration and Support  Additional Comments:  Pt verbalized that her day started as a 0 but ended as a 4. Pt verbalized that she felt as though she now feels as though she has the right diagnosis.  Pt verbalized that she is hopeful about her new medication.  Issaih Kaus, Patrick North 11/09/2017, 11:32 PM

## 2017-11-09 NOTE — [facility] Group Notes (Signed)
[facility] Group Notes:  (Nursing/MHT/Case Management/Adjunct)  Date:  11/09/2017  Time:  4:07 PM  Type of Therapy:  Psychoeducational Skills  Participation Level:  Did Not Attend  Participation Quality:  Did not attend   Affect:  Did not attend  Cognitive:  Did not attend   Insight:  None  Engagement in Group:  None  Modes of Intervention:  Activity, Discussion and Education  Summary of Progress/Problems: Patient was invited to group but did not attend.  

## 2017-11-09 NOTE — [facility] Group Notes (Signed)
[place] Group Notes:  (Nursing/MHT/Case Management/Adjunct)  Date:  11/09/2017  Time:  1:30 p.m.  Type of Therapy:  Group Therapy  Participation Level:  Did Not Attend  Participation Quality:    Affect:    Cognitive:    Insight:    Engagement in Group:    Modes of Intervention:    Summary of Progress/Problems:    Cammy Copa 11/09/2017, 3:37 PM

## 2017-11-09 NOTE — Progress Notes (Signed)
Patient ID: Gina James, female   DOB: Apr 04, 1981, 37 y.o.   MRN: [id number]  1:1-   D: Pt currently sitting in the dayroom talking pleasantly with a peer. Sitter is currently at patients side.   A: Visual assessment completed. Pt given prn medication for nausea previously.   R: Pt remains safe on a 1:1 per MD orders. Pt in no current distress. Will continue to monitor.

## 2017-11-09 NOTE — Progress Notes (Signed)
1:1 observation initiated at 0846 per NP due to pt's labile mood and pt threatening to kill NP. Pt observed yelling, crying, cursing and threatening to kill NP on approach due to recent med changes. Pt stated, "I don't like him, he doesn't care about me, he's making all these changes to my meds and now I'm worst".

## 2017-11-09 NOTE — Progress Notes (Addendum)
[facility] MD Progress Note  11/09/2017 10:10 AM Gina James  MRN:  [id number]   Subjective:  Patient describes feeling depressed but at the same time irritable, easily angered.   Objective:  I have discussed case with treatment team and have met with patient . She is a 37 year old married female, has two children, homemaker.  She reports long history of depression for which she has been on Effexor XR for years . States that Effexor XR dose fluctuated over the years but was generally 150 mgrs QDAY. Prior to admission it had been 225 mgrs QDAY. States that she feels Effexor XR has been effective and well tolerated . She reports that prior to admission her outpatient psychiatrist had added Trintellix to Effexor XR, as she had complained of having residual symptoms. States that after this medication change she felt " a lot worse", and describes an increase in irritability, anger, and vague auditory hallucinations . We have reviewed history - patient denies history of mania or any clear hypomanic episodes, and endorses mainly depression and anxiety. No prior psychiatric medications or history of psychosis. She does report having a long history of " anger", which she describes in a manner suggestive of intermittent explosive disorder. States she has brief episodes of anger, angry outbursts, of short duration, after which she tends to feel guilty about her explosiveness . States this has been a long standing issue for her and which she wants to change . She states she has been smoking cannabis regularly in part as a way to control these symptoms. Earlier this AM had episode of agitation , made verbal threats towards a staff member . At this time denies HI, and is somewhat apologetic about it. Regarding medication, states " I did best when I was on Effexor XR , and I know I went into withdrawal when it was stopped ". States symptoms improved when Effexor was restarted .   Principal Problem: Bipolar disorder,  unspecified ([place]) Diagnosis:   Patient Active Problem List   Diagnosis Date Noted  . Bipolar disorder, unspecified ([place]) [F31.9] 11/04/2017  . Cervical disc disorder with radiculopathy of cervical region [M50.10] 10/21/2017  . S/P spinal surgery [Z98.890] 08/12/2017  . DDD (degenerative disc disease), lumbar [M51.36] 01/13/2017  . Depression with anxiety [F41.8] 08/04/2016  . 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 PMDD (premenstrual dysphoric disorder) [F32.81] 07/29/2015  . Preventative health care [Z00.00] 10/29/2014  . Obesity [E66.9] 06/10/2014  . 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 Lumbar pain with radiation down both legs [M54.5] 09/11/2013  . Generalized anxiety disorder [F41.1] 06/18/2013   Total Time spent with patient: 25 minutes   Past Psychiatric History: See H&P  Past Medical History:  Past Medical History:  Diagnosis Date  . Ankle fracture, right   . Anxiety   . Arthritis    "in my back" (04/08/2015)  . Chronic lower back pain   . Complication of anesthesia    difficulty urinating after anesthesia  . Headache    "monthly" (04/08/2015)  . Post partum depression     Past Surgical History:  Procedure Laterality Date  . ANTERIOR FUSION LUMBAR SPINE  07/06/2017  . BREAST SURGERY    . CESAREAN SECTION  2007 and 2009  . COMBINED ABDOMINOPLASTY AND LIPOSUCTION  12/2014   "liposuction in thighs"  . FRACTURE SURGERY    . LAPAROSCOPIC ABDOMINAL EXPLORATION  1997   "to check for endometriosis"  . LAPAROSCOPIC CHOLECYSTECTOMY  2007  . OPEN REDUCTION INTERNAL FIXATION (ORIF) TIBIA/FIBULA FRACTURE Right 04/08/2015   Procedure: OPEN REDUCTION INTERNAL FIXATION (ORIF)  RIGHT TIBIA PILON FRACTURE;  Surgeon: Wylene Simmer, MD;  Location: [facility];  Service: Orthopedics;  Laterality: Right;  . TIBIA FRACTURE SURGERY Right 04/08/2015   ORIF tibial pilon   Family History:  Family History  Problem Relation Age of Onset  . Diabetes Father   .  Cancer Sister 30       breast, had chemo/double mastectomy  . Heart disease Maternal Grandfather   . Heart disease Paternal Grandfather    Family Psychiatric  History: See H&P Social History:  Social History   Substance and Sexual Activity  Alcohol Use Yes  . Alcohol/week: 2.4 oz  . Types: 4 Cans of beer per week   Comment: 04/08/2015 "drink only the weekends"     Social History   Substance and Sexual Activity  Drug Use No    Social History   Socioeconomic History  . Marital status: Married    Spouse name: None  . Number of children: None  . Years of education: None  . Highest education level: None  Social Needs  . Financial resource strain: None  . Food insecurity - worry: None  . Food insecurity - inability: None  . Transportation needs - medical: None  . Transportation needs - non-medical: None  Occupational History  . None  Tobacco Use  . Smoking status: Former Smoker    Packs/day: 0.12    Years: 10.00    Pack years: 1.20    Types: Cigarettes    Last attempt to quit: 09/06/2004    Years since quitting: 13.1  . Smokeless tobacco: Never Used  Substance and Sexual Activity  . Alcohol use: Yes    Alcohol/week: 2.4 oz    Types: 4 Cans of beer per week    Comment: 04/08/2015 "drink only the weekends"  . Drug use: No  . Sexual activity: Yes    Comment: vasectomy in partner  Other Topics Concern  . 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       Additional Social History:   Sleep: improved   Appetite:  improving   Current Medications: Current Facility-Administered Medications  Medication Dose Route Frequency Provider Last Rate Last Dose  . acetaminophen (TYLENOL) tablet 650 mg  650 mg Oral Q6H PRN Lindon Romp A, NP      . alum & mag hydroxide-simeth (MAALOX/MYLANTA) 200-200-20 MG/5ML  suspension 30 mL  30 mL Oral Q4H PRN Lindon Romp A, NP   30 mL at 11/08/17 0843  . diphenhydrAMINE (BENADRYL) capsule 50 mg  50 mg Oral Q6H PRN Money, Lowry Ram, FNP       Or  . diphenhydrAMINE (BENADRYL) injection 50 mg  50 mg Intramuscular Q6H PRN Money, Darnelle Maffucci B, FNP      . gabapentin (NEURONTIN) capsule 300 mg  300 mg Oral TID Lindon Romp A, NP   300 mg at 11/09/17 0824  . haloperidol (HALDOL) tablet 5 mg  5 mg Oral Q6H PRN Money, Lowry Ram, FNP       Or  . haloperidol lactate (HALDOL) injection 5 mg  5 mg Intramuscular Q6H PRN Money, Lowry Ram, FNP      . hydrOXYzine (ATARAX/VISTARIL) tablet 50  mg  50 mg Oral TID PRN Rozetta Nunnery, NP   50 mg at 11/07/17 2126  . levonorgestrel-ethinyl estradiol (SEASONALE,INTROVALE,JOLESSA) 0.15-0.03 MG per tablet 1 tablet  1 tablet Oral QHS Lindon Romp A, NP   1 tablet at 11/08/17 2116  . LORazepam (ATIVAN) tablet 2 mg  2 mg Oral Q6H PRN Money, Lowry Ram, FNP       Or  . LORazepam (ATIVAN) injection 2 mg  2 mg Intramuscular Q6H PRN Money, Darnelle Maffucci B, FNP      . magnesium hydroxide (MILK OF MAGNESIA) suspension 30 mL  30 mL Oral Daily PRN Lindon Romp A, NP      . methocarbamol (ROBAXIN) tablet 750 mg  750 mg Oral BID PRN Lindon Romp A, NP   750 mg at 11/08/17 2105  . naproxen (NAPROSYN) tablet 500 mg  500 mg Oral BID PRN Lindon Romp A, NP   500 mg at 11/08/17 1112  . ondansetron (ZOFRAN-ODT) disintegrating tablet 4 mg  4 mg Oral Q8H PRN Money, Lowry Ram, FNP      . topiramate (TOPAMAX) tablet 50 mg  50 mg Oral QHS Lindon Romp A, NP   50 mg at 11/08/17 2106  . traZODone (DESYREL) tablet 50 mg  50 mg Oral QHS Lindon Romp A, NP   50 mg at 11/05/17 2157  . valACYclovir (VALTREX) tablet 500 mg  500 mg Oral QHS Lindon Romp A, NP   500 mg at 11/08/17 2107  . venlafaxine (EFFEXOR) tablet 37.5 mg  37.5 mg Oral BID WC Money, Darnelle Maffucci B, FNP   37.5 mg at 11/09/17 0825    Lab Results: No results found for this or any previous visit (from the past 48  hour(s)).  Blood Alcohol level:  Lab Results  Component Value Date   ETH <10 11/04/2017   ETH <10 82/50/5397    Metabolic Disorder Labs: Lab Results  Component Value Date   HGBA1C 4.9 11/05/2017   MPG 93.93 11/05/2017   No results found for: PROLACTIN Lab Results  Component Value Date   CHOL 229 (H) 11/05/2017   TRIG 196 (H) 11/05/2017   HDL 45 11/05/2017   CHOLHDL 5.1 11/05/2017   VLDL 39 11/05/2017   LDLCALC 145 (H) 11/05/2017   LDLCALC 87 10/28/2014    Physical Findings: AIMS: Facial and Oral Movements Muscles of Facial Expression: None, normal Lips and Perioral Area: None, normal Jaw: None, normal Tongue: None, normal,Extremity Movements Upper (arms, wrists, hands, fingers): None, normal Lower (legs, knees, ankles, toes): None, normal, Trunk Movements Neck, shoulders, hips: None, normal, Overall Severity Severity of abnormal movements (highest score from questions above): None, normal Incapacitation due to abnormal movements: None, normal Patient's awareness of abnormal movements (rate only patient's report): No Awareness, Dental Status Current problems with teeth and/or dentures?: No Does patient usually wear dentures?: No  CIWA:    COWS:     Musculoskeletal: Strength & Muscle Tone: within normal limits Gait & Station: normal Patient leans: N/A  Psychiatric Specialty Exam: Physical Exam  Nursing note and vitals reviewed. Constitutional: She is oriented to person, place, and time. She appears well-developed and well-nourished.  Cardiovascular: Normal rate.  Respiratory: Effort normal.  Musculoskeletal: Normal range of motion.  Neurological: She is alert and oriented to person, place, and time.  Skin: Skin is warm.    Review of Systems  Constitutional: Positive for malaise/fatigue.  HENT: Negative.   Eyes: Negative.   Respiratory: Negative.   Cardiovascular: Negative.   Gastrointestinal: Positive for  nausea.  Genitourinary: Negative.    Musculoskeletal: Negative.   Skin: Negative.   Neurological: Negative.   Endo/Heme/Allergies: Negative.   Psychiatric/Behavioral: Positive for depression. Negative for hallucinations, substance abuse and suicidal ideas.  no chest pain, no dyspnea   Blood pressure 128/82, pulse (!) 107, temperature 98.3 F (36.8 C), temperature source Oral, resp. rate 18, height _0  (1.626 m), weight 98.9 kg (218 lb).Body mass index is 37.42 kg/m.  General Appearance: Fairly Groomed  Eye Contact:  Good  Speech:  Normal Rate  Volume:  Normal  Mood:  dysphoric, depressed , but states feeling " a little better "  Affect:  labile  Thought Process:  Linear and Descriptions of Associations: Intact  Orientation:  Full (Time, Place, and Person)- she is fully alert and attentive and oriented x 3   Thought Content:  no hallucinations endorsed and does not appear internally preoccupied, no delusions expressed at this time   Suicidal Thoughts:  No denies SI or self injurious ideations  Homicidal Thoughts:  No earlier today made HI threats towards staff member . At this time denies , states " I just got angry".   Memory:  recent and remote grossly intact   Judgement:  Fair  Insight:  Fair  Psychomotor Activity:  Normal- no psychomotor restlessness or agitation noted at this time, no myoclonus or other abnormal movements noted   Concentration:  Concentration: Fair and Attention Span: Fair  Recall:  Good  Fund of Knowledge:  Good  Language:  Good  Akathisia:  No  Handed:  Right  AIMS (if indicated):     Assets:  Communication Skills Desire for Improvement Financial Resources/Insurance Housing Physical Health Social Support Transportation  ADL's:  Intact  Cognition:  WNL  Sleep:  Number of Hours: 6.75   Assessment - patient reports she developed acutely worsening symptoms of mood lability, irritability, anger following addition of Vortioxetine to Venlafaxine recently. States she has been on Venlafaxine  for many years with good tolerance and good response . She reports recent symptoms consistent with Venlafaxine WDL, now improving .  Of note, patient denies any clear history of mania or hypomania , but does endorse history of brief  angry outbursts and explosive anger , recently worsened .  Of note, patient is not currently presenting with symptoms of serotonin syndrome, and is fully alert, attentive and oriented x 3.  We discussed options - patient reports she tolerated low dose Risperidone poorly . Agrees to Depakote ER trial to address mood lability and intermittent explosiveness. Side effects reviewed. She wants to continue Effexor XR, states " it has worked the best for me ".  Will start Depakote ER at low dose, as reports history of being sensitive to medication side effects  Treatment Plan Summary: Daily contact with patient to assess and evaluate symptoms and progress in treatment, Medication management and Plan is to:  Treatment Plan reviewed as below today 3/6  -Continue Gabapentin 300 mg PO TID for pan, anxiety  -Start Depakote ER 250 mgrs QHS for mood disorder - titrate gradually as tolerated  -Restart Effexor XR at 75 mgrs QDAY for depression, anxiety -Continue  Ativan 0.5 mg PO Q6H PRN for anxiety -Continue Topamax 50 mg PO Daily  -Continue Trazodone 50 mg PO QHS PRN for insomnia -D/C Valtrex, as medication may be associated with psychiatric side effects.  -Encourage group therapy participation to work on coping skills and symptom reduction -Currently on one to one observation for safety  -Treatment Team working  on disposition planning options  Jenne Campus, MD 11/09/2017, 10:10 AM   Patient ID: Gina James, female   DOB: [birth year], 38 y.o.   MRN: [id number]

## 2017-11-09 NOTE — Progress Notes (Signed)
1700 1:1 observation note: Pt observed lying down in bed asleep with sitter present at bedside. Pt does not appear to be in distress. resp are even and unlabored. Safety maintained.

## 2017-11-09 NOTE — Progress Notes (Signed)
9 am 1:1 Note: Pt observed sitting down in her room talking with sitter. Sitter present and within arms length for safety. Pt noted to be less agitated after taking Haldol and Ativan. Pt did not appear to be in distress. Resp even and unlabored. No concerns verbalized by pt at that time.

## 2017-11-09 NOTE — [facility] Group Notes (Signed)
[facility] Mental Health Association Group Therapy 11/09/2017 1:15pm  Type of Therapy: Mental Health Association Presentation  Participation Level: Invited. Chose to remain in bed.   Summary of Progress/Problems: [place] [facility]) Speaker came to talk about his personal journey with mental health. The pt processed ways by which to relate to the speaker. [place] speaker provided handouts and educational information pertaining to groups and services offered by the [facility]. Pt was engaged in speaker's presentation and was receptive to resources provided.    [employer], LCSW 11/09/2017 12:48 PM

## 2017-11-10 ENCOUNTER — Ambulatory Visit: Payer: BLUE CROSS/BLUE SHIELD | Admitting: Neurology

## 2017-11-10 MED ORDER — VENLAFAXINE HCL ER 150 MG PO CP24
150.0000 mg | ORAL_CAPSULE | Freq: Every day | ORAL | Status: DC
  Administered 2017-11-11: 150 mg via ORAL
  Filled 2017-11-10 (×2): qty 1

## 2017-11-10 MED ORDER — DIVALPROEX SODIUM ER 500 MG PO TB24
500.0000 mg | ORAL_TABLET | Freq: Every day | ORAL | Status: DC
  Administered 2017-11-10: 500 mg via ORAL
  Filled 2017-11-10 (×3): qty 1

## 2017-11-10 NOTE — Progress Notes (Signed)
Patient ID: Gina James, female   DOB: Dec 06, 1980, 37 y.o.   MRN: [id number]  1:1-   D: Pt currently lying in bed with eyes closed. Respirations even and unlabored. Sitter is currently within arms reach of patient.  A: Visual assessment of patient and environmental safety completed. Sitter encouraged to share any pertinent observations. None noted. R: Pt remains safe on a 1:1 per MD orders. Pt in no current distress. Will continue to monitor.

## 2017-11-10 NOTE — Progress Notes (Signed)
Pt presents with a flat affect and anxious mood. Pt mood is labile and unpredictable. Nursing self reported that pt was working herself up this morning to have a meltdown while in the dayroom with peers. Pt became agitated this morning, stating that she doesn't want to be here and that she's not getting the treatment that she needs. Pt appears to be attention seeking. Pt noted to have mood swings, agitation and crying spells. Verbal support provided.

## 2017-11-10 NOTE — Progress Notes (Signed)
[facility] 1:1 Observation Documentation  For the first (8) hours following discontinuation of 1:1 precautions, a progress note entry by nursing staff should be documented at least every 2 hours, reflecting the patient's behavior, condition, mood, and conversation.  Use the progress notes for additional entries.  Time 1:1 discontinued:  0854  Patient's Behavior:  Flat.   Patient's Condition:  Pt safety maintained by 15 minute checks.  Patient's Conversation:  Soft/logical  Marissa Calamity 11/10/2017, 5:39 PM

## 2017-11-10 NOTE — Progress Notes (Signed)
Patient ID: Gina James, female   DOB: [birth year], 37 y.o.   MRN: [id number]  Pt currently presents with a labile affect and behavior. Pt reports to writer that their goal is to "hope this medication fixes my outburst. I don't know I guess we'll see." Pt reports good sleep with current medication regimen.   Pt provided with medications per providers orders. Pt's labs and vitals were monitored throughout the night. Pt given a 1:1 about emotional and mental status. Pt supported and encouraged to express concerns and questions.   Pt's safety ensured with 15 minute and environmental checks. Pt currently denies SI/HI and A/V hallucinations. Pt verbally agrees to seek staff if SI/HI or A/VH occurs and to consult with staff before acting on any harmful thoughts. Pt remains on a 1:1 for safety. Will continue POC.

## 2017-11-10 NOTE — Progress Notes (Signed)
Adult Psychoeducational Group Note  Date:  11/10/2017 Time:  1600  Group Topic/Focus:  Coping With Mental Health Crisis:   The purpose of this group is to help patients identify strategies for coping with mental health crisis.  Group discusses possible causes of crisis and ways to manage them effectively.  Participation Level:  Did not attend.   11/10/2017, 5:15 PM

## 2017-11-10 NOTE — Progress Notes (Signed)
[facility] 1:1 Observation Documentation  For the first (8) hours following discontinuation of 1:1 precautions, a progress note entry by nursing staff should be documented at least every 2 hours, reflecting the patient's behavior, condition, mood, and conversation.  Use the progress notes for additional entries.  Time 1:1 discontinued:  0854  Patient's Behavior:  Anxious   Patient's Condition:  15 minute checks performed for safety  Patient's Conversation:  soft/logical  Marissa Calamity 11/10/2017, 1:56 PM

## 2017-11-10 NOTE — Progress Notes (Signed)
[facility] 1:1 Observation Documentation  For the first (8) hours following discontinuation of 1:1 precautions, a progress note entry by nursing staff should be documented at least every 2 hours, reflecting the patient's behavior, condition, mood, and conversation.  Use the progress notes for additional entries.  Time 1:1 discontinued:  0854  Patient's Behavior: calm and cooperative    Patient's Condition:  Pt safety maintained. 15 minute checks performed for safety.  Patient's Conversation:  Soft/slow/logical  [place] Luppino L 11/10/2017, 8:57 AM

## 2017-11-10 NOTE — Progress Notes (Signed)
[facility] MD Progress Note  11/10/2017 4:53 PM Gina James  MRN:  [id number]   Subjective: patient reports that she is feeling singificantly better, less depressed, less irritable, and more like her " usual self ". She states she feels current medication regimen ( she is now back on Effexor XR which she has been on for years, and on Depakote ER- new trial ) is working and well tolerated thus far . States that today, although vaguely irritated by issues, she has felt much calmer and less angry, and states this represents a significant improvement compared to how she felt prior to admission. As she improves she is focusing more on disposition planning and is hoping for discharge soon.    Objective:  I have discussed case with treatment team and have met with patient . Patient presents alert, attentive, calm, pleasant / cooperative on approach. No current irritability or anger noted or endorsed.As above, reports she is feeling better, calmer, and states she notices a distinct reduction in her tendency to feel angered or agitated . Currently denies suicidal or homicidal ideations. She is now off one to one observation and is noted to be visible in milieu. Denies medication side effects- we reviewed medication side effects, including risk of teratogenicity on Depakote, and importance of periodic blood draws to monitor serum concentration and other parameters . She is not currently presenting with or endorsing symptoms of Venlafaxine WDL, and states she feels much better now that she is back on this medication. She states she has done well on Venlafaxine at 150 mgrs QDAY, and had been on this medication for years .    Principal Problem: Bipolar disorder, unspecified ([place]) Diagnosis:   Patient Active Problem List   Diagnosis Date Noted  . Bipolar disorder, unspecified ([place]) [F31.9] 11/04/2017  . Cervical disc disorder with radiculopathy of cervical region [M50.10] 10/21/2017  . S/P spinal surgery  [Z98.890] 08/12/2017  . DDD (degenerative disc disease), lumbar [M51.36] 01/13/2017  . Depression with anxiety [F41.8] 08/04/2016  . 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 PMDD (premenstrual dysphoric disorder) [F32.81] 07/29/2015  . Preventative health care [Z00.00] 10/29/2014  . Obesity [E66.9] 06/10/2014  . 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 Lumbar pain with radiation down both legs [M54.5] 09/11/2013  . Generalized anxiety disorder [F41.1] 06/18/2013   Total Time spent with patient: 20 minutes  Past Psychiatric History: See H&P  Past Medical History:  Past Medical History:  Diagnosis Date  . Ankle fracture, right   . Anxiety   . Arthritis    "in my back" (04/08/2015)  . Chronic lower back pain   . Complication of anesthesia    difficulty urinating after anesthesia  . Headache    "monthly" (04/08/2015)  . Post partum depression     Past Surgical History:  Procedure Laterality Date  . ANTERIOR FUSION LUMBAR SPINE  07/06/2017  . BREAST SURGERY    . CESAREAN SECTION  2007 and 2009  . COMBINED ABDOMINOPLASTY AND LIPOSUCTION  12/2014   "liposuction in thighs"  . FRACTURE SURGERY    . LAPAROSCOPIC ABDOMINAL EXPLORATION  1997   "to check for endometriosis"  . LAPAROSCOPIC CHOLECYSTECTOMY  2007  . OPEN REDUCTION INTERNAL FIXATION (ORIF) TIBIA/FIBULA FRACTURE Right 04/08/2015   Procedure: OPEN REDUCTION INTERNAL FIXATION (ORIF)  RIGHT TIBIA PILON FRACTURE;  Surgeon: Wylene Simmer, MD;  Location: [facility];  Service: Orthopedics;  Laterality: Right;  . TIBIA FRACTURE SURGERY Right 04/08/2015   ORIF tibial  pilon   Family History:  Family History  Problem Relation Age of Onset  . Diabetes Father   . Cancer Sister 30       breast, had chemo/double mastectomy  . Heart disease Maternal Grandfather   . Heart disease Paternal Grandfather    Family Psychiatric  History: See H&P Social History:  Social History   Substance and Sexual Activity   Alcohol Use Yes  . Alcohol/week: 2.4 oz  . Types: 4 Cans of beer per week   Comment: 04/08/2015 "drink only the weekends"     Social History   Substance and Sexual Activity  Drug Use No    Social History   Socioeconomic History  . Marital status: Married    Spouse name: None  . Number of children: None  . Years of education: None  . Highest education level: None  Social Needs  . Financial resource strain: None  . Food insecurity - worry: None  . Food insecurity - inability: None  . Transportation needs - medical: None  . Transportation needs - non-medical: None  Occupational History  . None  Tobacco Use  . Smoking status: Former Smoker    Packs/day: 0.12    Years: 10.00    Pack years: 1.20    Types: Cigarettes    Last attempt to quit: 09/06/2004    Years since quitting: 13.1  . Smokeless tobacco: Never Used  Substance and Sexual Activity  . Alcohol use: Yes    Alcohol/week: 2.4 oz    Types: 4 Cans of beer per week    Comment: 04/08/2015 "drink only the weekends"  . Drug use: No  . Sexual activity: Yes    Comment: vasectomy in partner  Other Topics Concern  . 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       Additional Social History:   Sleep: Good  Appetite:  Good  Current Medications: Current Facility-Administered Medications  Medication Dose Route Frequency Provider Last Rate Last Dose  . acetaminophen (TYLENOL) tablet 650 mg  650 mg Oral Q6H PRN Lindon Romp A, NP      . alum & mag hydroxide-simeth (MAALOX/MYLANTA) 200-200-20 MG/5ML suspension 30 mL  30 mL Oral Q4H PRN Lindon Romp A, NP   30 mL at 11/08/17 0843  . divalproex (DEPAKOTE ER) 24 hr tablet 500 mg  500 mg Oral QHS Brailee Riede A, MD      . gabapentin (NEURONTIN) capsule 300 mg  300 mg Oral TID Lindon Romp A, NP   300 mg at  11/10/17 1642  . haloperidol (HALDOL) tablet 5 mg  5 mg Oral Q6H PRN Money, Lowry Ram, FNP       Or  . haloperidol lactate (HALDOL) injection 5 mg  5 mg Intramuscular Q6H PRN Money, Lowry Ram, FNP      . levonorgestrel-ethinyl estradiol (SEASONALE,INTROVALE,JOLESSA) 0.15-0.03 MG per tablet 1 tablet  1 tablet Oral QHS Lindon Romp A, NP   1 tablet at 11/09/17 2208  . LORazepam (ATIVAN) injection 1 mg  1 mg Intramuscular Q6H PRN Renee Erb, Myer Peer, MD       Or  . LORazepam (ATIVAN) tablet 1 mg  1 mg Oral Q6H PRN Zykia Walla A, MD      . magnesium hydroxide (MILK OF MAGNESIA) suspension 30  mL  30 mL Oral Daily PRN Lindon Romp A, NP      . methocarbamol (ROBAXIN) tablet 750 mg  750 mg Oral BID PRN Lindon Romp A, NP   750 mg at 11/08/17 2105  . naproxen (NAPROSYN) tablet 500 mg  500 mg Oral BID PRN Lindon Romp A, NP   500 mg at 11/08/17 1112  . ondansetron (ZOFRAN-ODT) disintegrating tablet 4 mg  4 mg Oral Q8H PRN Money, Lowry Ram, FNP   4 mg at 11/09/17 2003  . topiramate (TOPAMAX) tablet 50 mg  50 mg Oral QHS Lindon Romp A, NP   50 mg at 11/09/17 2205  . traZODone (DESYREL) tablet 50 mg  50 mg Oral QHS Lindon Romp A, NP   50 mg at 11/09/17 2205  . [START ON 11/11/2017] venlafaxine XR (EFFEXOR-XR) 24 hr capsule 150 mg  150 mg Oral Q breakfast Jearldine Cassady, Myer Peer, MD        Lab Results: No results found for this or any previous visit (from the past 48 hour(s)).  Blood Alcohol level:  Lab Results  Component Value Date   ETH <10 11/04/2017   ETH <10 95/18/8416    Metabolic Disorder Labs: Lab Results  Component Value Date   HGBA1C 4.9 11/05/2017   MPG 93.93 11/05/2017   No results found for: PROLACTIN Lab Results  Component Value Date   CHOL 229 (H) 11/05/2017   TRIG 196 (H) 11/05/2017   HDL 45 11/05/2017   CHOLHDL 5.1 11/05/2017   VLDL 39 11/05/2017   LDLCALC 145 (H) 11/05/2017   LDLCALC 87 10/28/2014    Physical Findings: AIMS: Facial and Oral Movements Muscles of Facial  Expression: None, normal Lips and Perioral Area: None, normal Jaw: None, normal Tongue: None, normal,Extremity Movements Upper (arms, wrists, hands, fingers): None, normal Lower (legs, knees, ankles, toes): None, normal, Trunk Movements Neck, shoulders, hips: None, normal, Overall Severity Severity of abnormal movements (highest score from questions above): None, normal Incapacitation due to abnormal movements: None, normal Patient's awareness of abnormal movements (rate only patient's report): No Awareness, Dental Status Current problems with teeth and/or dentures?: No Does patient usually wear dentures?: No  CIWA:    COWS:     Musculoskeletal: Strength & Muscle Tone: within normal limits- no psychomotor agitation or restlessness . Gait & Station: normal Patient leans: N/A  Psychiatric Specialty Exam: Physical Exam  Nursing note and vitals reviewed. Constitutional: She is oriented to person, place, and time. She appears well-developed and well-nourished.  Cardiovascular: Normal rate.  Respiratory: Effort normal.  Musculoskeletal: Normal range of motion.  Neurological: She is alert and oriented to person, place, and time.  Skin: Skin is warm.    Review of Systems  Constitutional: Positive for malaise/fatigue.  HENT: Negative.   Eyes: Negative.   Respiratory: Negative.   Cardiovascular: Negative.   Gastrointestinal: Positive for nausea.  Genitourinary: Negative.   Musculoskeletal: Negative.   Skin: Negative.   Neurological: Negative.   Endo/Heme/Allergies: Negative.   Psychiatric/Behavioral: Positive for depression. Negative for hallucinations, substance abuse and suicidal ideas.  no chest pain, no dyspnea  No vomiting , no diarrhea   Blood pressure 117/71, pulse (!) 103, temperature 98.5 F (36.9 C), temperature source Oral, resp. rate 18, height 5' 4"  (1.626 m), weight 98.9 kg (218 lb).Body mass index is 37.42 kg/m.  General Appearance: improved grooming   Eye  Contact:  Good  Speech:  Normal Rate  Volume:  Normal  Mood:  improved, currently denies feeling depressed or  angry   Affect:  more reactive, calm, not irritable or angry at present   Thought Process:  Goal Directed and Descriptions of Associations: Intact  Orientation:  Full (Time, Place, and Person)  Thought Content:  no hallucinations,no delusions   Suicidal Thoughts:  No denies SI or self injurious ideations  Homicidal Thoughts:  No denies any HI or violent ideations  Memory:  recent and remote grossly intact   Judgement:  Fair- improving   Insight:  Fair- improving   Psychomotor Activity:  Normal-   Concentration:  Concentration: Good and Attention Span: Good  Recall:  Good  Fund of Knowledge:  Good  Language:  Good  Akathisia:  No  Handed:  Right  AIMS (if indicated):     Assets:  Communication Skills Desire for Improvement Financial Resources/Insurance Housing Physical Health Social Support Transportation  ADL's:  Intact  Cognition:  WNL  Sleep:  Number of Hours: 6.75   Assessment - At this time patient reports improving mood and decreased sense of irritability, anger. Presents calm, with a much improved range of affect . Denies SI or HI. States medications are effective and denies side effects. Currently becoming more focused on discharge planning and hoping for discharge soon.  Treatment Plan Summary: Daily contact with patient to assess and evaluate symptoms and progress in treatment, Medication management and Plan is to:  Treatment Plan reviewed as below today 3/7  -Continue Gabapentin 300 mg PO TID for pan, anxiety  -Increase  Depakote ER to 500 mgrs QHS for mood disorder  -Increase Effexor XR to 150  mgrs QDAY for depression, anxiety- this was dose she had been on for years up to admission -Continue  Ativan 0.5 mg PO Q6H PRN for anxiety -Continue Topamax 50 mg PO Daily  -Continue Trazodone 50 mg PO QHS PRN for insomnia -Encourage group therapy participation to  work on Radiographer, therapeutic and symptom reduction -Treatment Team working on disposition planning options  Jenne Campus, MD 11/10/2017, 4:53 PM   Patient ID: Lafe Garin, female   DOB: 1981-06-14, 37 y.o.   MRN: [id number]

## 2017-11-10 NOTE — Progress Notes (Signed)
Adult Psychoeducational Group Note  Date:  11/10/2017 Time:  3:34 PM  Group Topic/Focus:  Crisis Planning:   The purpose of this group is to help patients create a crisis plan for use upon discharge or in the future, as needed.  Participation Level:  Active  Participation Quality:  Appropriate  Affect:  Appropriate  Cognitive:  Alert  Insight: Appropriate  Engagement in Group:  Engaged  Modes of Intervention:  Discussion and Education  Additional Comments:  Pt shared that she needed to get her husband to understand the issues that she is having, but she does not know exactly where to look for help.  Hurshel Bouillon E 11/10/2017, 3:34 PM

## 2017-11-10 NOTE — Progress Notes (Signed)
[facility] 1:1 Observation Documentation  For the first (8) hours following discontinuation of 1:1 precautions, a progress note entry by nursing staff should be documented at least every 2 hours, reflecting the patient's behavior, condition, mood, and conversation.  Use the progress notes for additional entries.  Time 1:1 discontinued:  0858  Patient's Behavior:  anxious and cooperative   Patient's Condition:  Safety maintained by 15 minute checks   Patient's Conversation:  Soft/logical   Gina James L 11/10/2017, 11:02 AM

## 2017-11-10 NOTE — Progress Notes (Signed)
[facility] 1:1 Observation Documentation  For the first (8) hours following discontinuation of 1:1 precautions, a progress note entry by nursing staff should be documented at least every 2 hours, reflecting the patient's behavior, condition, mood, and conversation.  Use the progress notes for additional entries.  Time 1:1 discontinued:  7858  Patient's Behavior:  Attended group.  Patient's Condition:  15 minute checks performed for safety.  Patient's Conversation:  Soft/logical   Orell Hurtado L 11/10/2017, 4:02 PM

## 2017-11-10 NOTE — BHH Group Notes (Signed)
[facility] LCSW Group Therapy Note  Date/Time: 11/10/17, 1315  Type of Therapy/Topic:  Group Therapy:  Balance in Life  Participation Level:  moderate  Description of Group:    This group will address the concept of balance and how it feels and looks when one is unbalanced. Patients will be encouraged to process areas in their lives that are out of balance, and identify reasons for remaining unbalanced. Facilitators will guide patients utilizing problem- solving interventions to address and correct the stressor making their life unbalanced. Understanding and applying boundaries will be explored and addressed for obtaining  and maintaining a balanced life. Patients will be encouraged to explore ways to assertively make their unbalanced needs known to significant others in their lives, using other group members and facilitator for support and feedback.  Therapeutic Goals: 1. Patient will identify two or more emotions or situations they have that consume much of in their lives. 2. Patient will identify signs/triggers that life has become out of balance:  3. Patient will identify two ways to set boundaries in order to achieve balance in their lives:  4. Patient will demonstrate ability to communicate their needs through discussion and/or role plays  Summary of Patient Progress:Pt shared that physical, mental/emotional and friends are out of balance in her life currently.  Pt did participate in group discussion about ways to address areas of life that are out of balance.  Pt was attentive during the group discussion.          Therapeutic Modalities:   Cognitive Behavioral Therapy Solution-Focused Therapy Assertiveness Training  Lurline Idol, [place]

## 2017-11-10 NOTE — Progress Notes (Signed)
Adult Psychoeducational Group Note  Date:  11/10/2017 Time:  10:09 AM  Group Topic/Focus:  Goals Group:   The focus of this group is to help patients establish daily goals to achieve during treatment and discuss how the patient can incorporate goal setting into their daily lives to aide in recovery.  Participation Level:  Minimal  Participation Quality:  Appropriate  Affect:  Appropriate  Cognitive:  Alert  Insight: Appropriate  Engagement in Group:  Engaged  Modes of Intervention:  Discussion and Education  Additional Comments:  Pt attended goals group this morning and shared appropriately.  Averyana Pillars E 11/10/2017, 10:09 AM

## 2017-11-10 NOTE — Progress Notes (Signed)
Patient ID: Gina James, female   DOB: 02-11-1981, 37 y.o.   MRN: [id number]  1:1-   D: Pt currently lying in bed with eyes closed. Respirations even and unlabored. Sitter is currently within arms reach of patient.  A: Visual assessment of patient and environmental safety completed. Sitter encouraged to share any pertinent observations. None noted. R: Pt remains safe on a 1:1 per MD orders. Pt in no current distress. Will continue to monitor.

## 2017-11-11 MED ORDER — GABAPENTIN 300 MG PO CAPS: 300.0000 mg | ORAL_CAPSULE | Freq: Three times a day (TID) | ORAL | 0 refills | Status: DC

## 2017-11-11 MED ORDER — LOPERAMIDE HCL 2 MG PO CAPS
2.0000 mg | ORAL_CAPSULE | Freq: Once | ORAL | Status: DC
  Filled 2017-11-11: qty 1

## 2017-11-11 MED ORDER — DIVALPROEX SODIUM ER 500 MG PO TB24: 500.0000 mg | ORAL_TABLET | Freq: Every day | ORAL | 0 refills | Status: DC

## 2017-11-11 MED ORDER — TRAZODONE HCL 50 MG PO TABS: 50.0000 mg | ORAL_TABLET | Freq: Every day | ORAL | 0 refills | Status: DC

## 2017-11-11 MED ORDER — VENLAFAXINE HCL ER 150 MG PO CP24: 150.0000 mg | ORAL_CAPSULE | Freq: Every day | ORAL | 0 refills | Status: DC

## 2017-11-11 NOTE — Progress Notes (Signed)
Nursing Progress Note [id number]  D) Patient presents pleasant and cooperative. Patient did attend group. Patient is seen interactive in the milieu. Patient denies SI/HI/AVH. Patient contracts for safety on the unit. Patient compliant with medications. Patient inquiring about increased dose of Depakote but verbalized understanding of information provided. Patient complained of muscle aches to her neck and a headache relieved with PRN medications.  A) Patient educated about and provided medication as scheduled or requested per provider's orders. Patient safety maintained with q15 min safety checks. Low fall risk precautions in place. Emotional support given. 1:1 interaction and active listening provided. Snacks and fluids provided. Labs, vital signs and patient behavior monitored throughout shift. Patient encouraged to work on treatment plan.  R) Patient remains safe on the unit at this time. Patient agrees to make needs known to staff. Patient currently asleep in bed. Will continue to monitor and assess for changes.

## 2017-11-11 NOTE — Progress Notes (Signed)
Psychoeducational Group Note  Date:  11/11/2017 Time: 0830  Group Topic/Focus:  Goals Group:   The focus of this group is to help patients establish daily goals to achieve during treatment and discuss how the patient can incorporate goal setting into their daily lives to aide in recovery.  Participation Level: Did Not Attend  Participation Quality:  Not Applicable  Affect:  Not Applicable  Cognitive:  Not Applicable  Insight:  Not Applicable  Engagement in Group: Not Applicable  Additional Comments:    Larkin Ina Patience 11/11/2017, 10:01 AM

## 2017-11-11 NOTE — BHH Suicide Risk Assessment (Signed)
[facility] Discharge Suicide Risk Assessment   Principal Problem: Bipolar disorder, unspecified [facility]) Discharge Diagnoses:  Patient Active Problem List   Diagnosis Date Noted  . Bipolar disorder, unspecified ([place]) [F31.9] 11/04/2017  . Cervical disc disorder with radiculopathy of cervical region [M50.10] 10/21/2017  . S/P spinal surgery [Z98.890] 08/12/2017  . DDD (degenerative disc disease), lumbar [M51.36] 01/13/2017  . Depression with anxiety [F41.8] 08/04/2016  . 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 PMDD (premenstrual dysphoric disorder) [F32.81] 07/29/2015  . Preventative health care [Z00.00] 10/29/2014  . Obesity [E66.9] 06/10/2014  . 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 Lumbar pain with radiation down both legs [M54.5] 09/11/2013  . Generalized anxiety disorder [F41.1] 06/18/2013    Total Time spent with patient: 30 minutes  Musculoskeletal: Strength & Muscle Tone: within normal limits Gait & Station: normal Patient leans: N/A  Psychiatric Specialty Exam: ROS reports mild headache, no chest pain, no shortness of breath, reports diarrhea . Denies rash, no fever, no chills  Blood pressure 127/72, pulse 88, temperature 98.6 F (37 C), temperature source Oral, resp. rate 18, height 5\' 4"  (1.626 m), weight 98.9 kg (218 lb).Body mass index is 37.42 kg/m.  General Appearance: Well Groomed  Eye Contact::  Good  Speech:  Normal [id number]  Volume:  Normal  Mood:  reports improved mood compared to admission. States she feels this is the best she has felt " in a while"  Affect:  Appropriate and no irritability or anger noted at this time  Thought Process:  Linear and Descriptions of Associations: Intact  Orientation:  Full (Time, Place, and Person)  Thought Content:  no hallucinations, no delusions, not internally preoccupied   Suicidal Thoughts:  No denies any suicidal or self injurious ideations, denies any homicidal or violent ideations .    Homicidal Thoughts:  No  Memory:  recent and remote grossly intact  Judgement:  Other:  fair- improving   Insight:  improving   Psychomotor Activity:  Normal  Concentration:  Good  Recall:  Good  Fund of Knowledge:Good  Language: Good  Akathisia:  Negative  Handed:  Right  AIMS (if indicated):     Assets:  Communication Skills Desire for Improvement Resilience  Sleep:  Number of Hours: 6.25  Cognition: WNL  ADL's:  Intact   Mental Status Per Nursing Assessment::   On Admission:     Demographic Factors:  37 year old married female, has two children, home maker   Loss Factors: Recent medication adjustments which patient reports were poorly tolerated .  Historical Factors: History of depression, history of angry outbursts and brief mood swings/lability, but no clear history of mania.   Risk Reduction Factors:   Responsible for children under 41 years of age, Sense of responsibility to family, Living with another person, especially a relative and Positive coping skills or problem solving skills  Continued Clinical Symptoms:  At this time patient is alert,attentive, well related, currently not irritable or angry. Describes mood as improved and affect presents more reactive. No thought disorder, no suicidal or self injurious ideations, no homicidal or violent ideations, no hallucinations, no delusions, future oriented . States for example that she plans to join a gym with her family this weekend.  Denies medication side effects. We reviewed side effects associated with Depakote, Neurontin,Effexor, including teratogenic risk of Valproic Acid and potential for sedation from Depakote/Neurontin, as well as potential for Venlafaxine WDL if stopped abruptly. We also reviewed potential for Valtrex to rarely  cause psychiatric /behavioral side effects. Behavior on unit calm and in good control, pleasant on approach.   Cognitive Features That Contribute To Risk:  No gross cognitive  deficits noted upon discharge. Is alert , attentive, and oriented x 3     Suicide Risk:  Mild:  Suicidal ideation of limited frequency, intensity, duration, and specificity.  There are no identifiable plans, no associated intent, mild dysphoria and related symptoms, good self-control (both objective and subjective assessment), few other risk factors, and identifiable protective factors, including available and accessible social support.  Follow-up Information    Chucky May, MD. Go in 4 day(s).   Specialty:  Psychiatry Why:  Follow-up appointment is 11/14/17 at 3:15pm Contact [address] [place] [address] [phone]        [facility] Follow up.   Why:  This is a new address.  Next appointment with Aquilla Solian is  Contact information: [address]   [place] [address]   [phone]          Plan Of Care/Follow-up recommendations:  Activity:  as tolerated  Diet:  Regular Tests:  NA Other:  See below  Patient is expressing readiness for discharge and is leaving unit in good spirits. Plans to return home, and states her husband will pick her up later today Plans to follow up as above  She has a PCP for medical issues Debbrah Alar , PA) for medical issues as needed   Jenne Campus, MD 11/11/2017, 10:11 AM

## 2017-11-11 NOTE — Progress Notes (Signed)
Pt d/c from the hospital. All items returned. D/C instructions given and prescriptions given. Pt denies si and hi. 

## 2017-11-11 NOTE — Discharge Summary (Addendum)
Physician Discharge Summary Note  Patient:  Gina James is an 37 y.o., female MRN:  [id number] DOB:  Mar 20, 1981 Patient phone:  [phone] (home)  Patient address:   [place] [address],  Total Time spent with patient: Greater than 30 minutes  Date of Admission:  11/04/2017 Date of Discharge: 11/11/17  Reason for Admission: Thoughts of rage & homicidal ideations.  Principal Problem: Bipolar disorder, unspecified [facility]) Discharge Diagnoses: Patient Active Problem List   Diagnosis Date Noted  . Bipolar disorder, unspecified ([place]) [F31.9] 11/04/2017  . Cervical disc disorder with radiculopathy of cervical region [M50.10] 10/21/2017  . S/P spinal surgery [Z98.890] 08/12/2017  . DDD (degenerative disc disease), lumbar [M51.36] 01/13/2017  . Depression with anxiety [F41.8] 08/04/2016  . 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 PMDD (premenstrual dysphoric disorder) [F32.81] 07/29/2015  . Preventative health care [Z00.00] 10/29/2014  . Obesity [E66.9] 06/10/2014  . 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 Lumbar pain with radiation down both legs [M54.5] 09/11/2013  . Generalized anxiety disorder [F41.1] 06/18/2013   Past Psychiatric History: Bipolar disorder.  Past Medical History:  Past Medical History:  Diagnosis Date  . Ankle fracture, right   . Anxiety   . Arthritis    "in my back" (04/08/2015)  . Chronic lower back pain   . Complication of anesthesia    difficulty urinating after anesthesia  . Headache    "monthly" (04/08/2015)  . Post partum depression     Past Surgical History:  Procedure Laterality Date  . ANTERIOR FUSION LUMBAR SPINE  07/06/2017  . BREAST SURGERY    . CESAREAN SECTION  2007 and 2009  . COMBINED ABDOMINOPLASTY AND LIPOSUCTION  12/2014   "liposuction in thighs"  . FRACTURE SURGERY    . LAPAROSCOPIC ABDOMINAL EXPLORATION  1997   "to check for endometriosis"  . LAPAROSCOPIC  CHOLECYSTECTOMY  2007  . OPEN REDUCTION INTERNAL FIXATION (ORIF) TIBIA/FIBULA FRACTURE Right 04/08/2015   Procedure: OPEN REDUCTION INTERNAL FIXATION (ORIF)  RIGHT TIBIA PILON FRACTURE;  Surgeon: Wylene Simmer, MD;  Location: [place];  Service: Orthopedics;  Laterality: Right;  . TIBIA FRACTURE SURGERY Right 04/08/2015   ORIF tibial pilon   Family History:  Family History  Problem Relation Age of Onset  . Diabetes Father   . Cancer Sister 30       breast, had chemo/double mastectomy  . Heart disease Maternal Grandfather   . Heart disease Paternal Grandfather    Family Psychiatric  History: See H&P. Social History:  Social History   Substance and Sexual Activity  Alcohol Use Yes  . Alcohol/week: 2.4 oz  . Types: 4 Cans of beer per week   Comment: 04/08/2015 "drink only the weekends"     Social History   Substance and Sexual Activity  Drug Use No    Social History   Socioeconomic History  . Marital status: Married    Spouse name: None  . Number of children: None  . Years of education: None  . Highest education level: None  Social Needs  . Financial resource strain: None  . Food insecurity - worry: None  . Food insecurity - inability: None  . Transportation needs - medical: None  . Transportation needs - non-medical: None  Occupational History  . None  Tobacco Use  . Smoking status: Former Smoker    Packs/day: 0.12    Years: 10.00    Pack years: 1.20    Types: Cigarettes  Last attempt to quit: 09/06/2004    Years since quitting: 13.1  . Smokeless tobacco: Never Used  Substance and Sexual Activity  . Alcohol use: Yes    Alcohol/week: 2.4 oz    Types: 4 Cans of beer per week    Comment: 04/08/2015 "drink only the weekends"  . Drug use: No  . Sexual activity: Yes    Comment: vasectomy in partner  Other Topics Concern  . 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       Hospital Course: (per admission Md's assessment): 37 y.o Caucasian female, married, lives with her husband and two kids. Background history of MDD, anxiety disorder and chronic pain. Presented to the ER in company of her family. She reports that her antidepressant was switched two weeks ago from Venlafaxine to Vortioxetine. Says since then she has been having thoughts of rage. She feels like running people over with her car. She has been having thoughts of harm towards her husband. She has been having passive death wish but no actual suicidal thoughts. She has been hearing voices stating that people are out to get her. Routine labs shows increased lipid panels. Other parameters are essentially normal. Toxicology is negative. UDS is positive for benzodiazepines and THC. BAL <5 mg/dl.  After the above admission assessment, Gina James was started on the medication regimen for her presenting symptoms. She received & was discharged on; Depakote 500 mg for mood stabilization, Gabapentin 300 mg for agitation, Topamax 50 mg for mood stabilization, Trazodone 50 mg for insomnia & Effexor XR 150 mg for depression. She was also enrolled & participated in the group counseling sessions being offered & held on this unit. She presented other significant health issues that requires treatment & or monitoring. She was resumed on all her pertinent home medications for those health issues. Gina James tolerated his treatment regimen without any adverse effects or reactions reported.  At her discharge assessment/interview today with her attending psychiatrist, Gina James reports feeling much better since starting on the medications. She says she feels she is back to her old self. No thoughts of suicide. No thoughts of homicide. No thoughts of violence. No access to weapons.  She reports that she is in good spirits. Not feeling depressed. Reports normal energy and interest. Has  been maintaining normal biological functions. She is able to think clearly. She is able to focus on task. Her thoughts are not crowded or racing. No evidence of mania. No hallucinations. She is not making any delusional statement. She is fully in touch with reality. No overwhelming anxiety. No craving for substances. She is looking forward to going home to her family.   Gina James's case was discussed at the treatment team meeting today. The nursing staff notes that she has been bright today. She has not been observed to be internally disturbed. She has not voiced any suicidal thoughts today. Patient has been cooperative with staff and has tolerated her medications well.  Team members feels that patient is back to her baseline level of function. Team agrees with plan to discharge patient today to continue mental health care on an outpatient basis as noted below. She left [facility] with all personal belongings in no apparent distress. Transportation per husband.   Physical Findings: AIMS: Facial and Oral Movements Muscles of Facial  Expression: None, normal Lips and Perioral Area: None, normal Jaw: None, normal Tongue: None, normal,Extremity Movements Upper (arms, wrists, hands, fingers): None, normal Lower (legs, knees, ankles, toes): None, normal, Trunk Movements Neck, shoulders, hips: None, normal, Overall Severity Severity of abnormal movements (highest score from questions above): None, normal Incapacitation due to abnormal movements: None, normal Patient's awareness of abnormal movements (rate only patient's report): No Awareness, Dental Status Current problems with teeth and/or dentures?: No Does patient usually wear dentures?: No  CIWA:    COWS:     Musculoskeletal: Strength & Muscle Tone: within normal limits Gait & Station: normal Patient leans: N/A  Psychiatric Specialty Exam: Physical Exam  Constitutional: She appears well-developed.  HENT:  Head: Normocephalic.  Eyes: Pupils are  equal, round, and reactive to light.  Neck: Normal range of motion.  Cardiovascular: Normal rate.  Respiratory: Effort normal.  GI: Soft.  Genitourinary:  Genitourinary Comments: Deferred  Musculoskeletal: Normal range of motion.  Neurological: She is alert.  Skin: Skin is warm.    Review of Systems  Constitutional: Negative.   HENT: Negative.   Eyes: Negative.   Respiratory: Negative.   Cardiovascular: Negative.   Gastrointestinal: Negative.   Genitourinary: Negative.   Skin: Negative.   Neurological: Negative.   Endo/Heme/Allergies: Negative.   Psychiatric/Behavioral: Positive for depression (Stable) and substance abuse (Hx. Benzodiazepine & Cocaine use disorder). Negative for hallucinations, memory loss and suicidal ideas. The patient has insomnia (Stable). The patient is not nervous/anxious.     Blood pressure 127/72, pulse 88, temperature 98.6 F (37 C), temperature source Oral, resp. rate 18, height 5\' 4"  (1.626 m), weight 98.9 kg (218 lb).Body mass index is 37.42 kg/m.  See Md's SRA   Have you used any form of tobacco in the last 30 days? (Cigarettes, Smokeless Tobacco, Cigars, and/or Pipes): No  Has this patient used any form of tobacco in the last 30 days? (Cigarettes, Smokeless Tobacco, Cigars, and/or Pipes): N/A  Blood Alcohol level:  Lab Results  Component Value Date   ETH <10 11/04/2017   ETH <10 16/06/9603   Metabolic Disorder Labs:  Lab Results  Component Value Date   HGBA1C 4.9 11/05/2017   MPG 93.93 11/05/2017   No results found for: PROLACTIN Lab Results  Component Value Date   CHOL 229 (H) 11/05/2017   TRIG 196 (H) 11/05/2017   HDL 45 11/05/2017   CHOLHDL 5.1 11/05/2017   VLDL 39 11/05/2017   LDLCALC 145 (H) 11/05/2017   LDLCALC 87 10/28/2014   See Psychiatric Specialty Exam and Suicide Risk Assessment completed by Attending Physician prior to discharge.  Discharge destination:  Home  Is patient on multiple antipsychotic therapies at  discharge:  No   Has Patient had three or more failed trials of antipsychotic monotherapy by history:  No  Recommended Plan for Multiple Antipsychotic Therapies: NA  Allergies as of 11/11/2017      Reactions   Ciprofloxacin Hives   Lexapro [escitalopram] Nausea And Vomiting   Amoxicillin Hives, Other (See Comments)   Has patient had a PCN reaction causing immediate rash, facial/tongue/throat swelling, SOB or lightheadedness with hypotension: no Has patient had a PCN reaction causing severe rash involving mucus membranes or skin necrosis: no Has patient had a PCN reaction that required hospitalization: no Has patient had a PCN reaction occurring within the last 10 years: no - childhood reaction If all of the above answers are "NO", then may proceed with Cephalosporin use.   Ceclor [cefaclor] Hives   Erythromycin Base  Rash   Penicillins Hives, Other (See Comments)   Has patient had a PCN reaction causing immediate rash, facial/tongue/throat swelling, SOB or lightheadedness with hypotension: no Has patient had a PCN reaction causing severe rash involving mucus membranes or skin necrosis: no Has patient had a PCN reaction that required hospitalization: no Has patient had a PCN reaction occurring within the last 10 years: no - childhood reaction If all of the above answers are "NO", then may proceed with Cephalosporin use.   Phenergan [promethazine Hcl] Hives   Sulfa Antibiotics Hives   Zithromax [azithromycin] Hives      Medication List    STOP taking these medications   ALEVE 220 MG tablet Generic drug:  naproxen sodium   hydrOXYzine 50 MG capsule Commonly known as:  VISTARIL   LORazepam 1 MG tablet Commonly known as:  ATIVAN   prochlorperazine 10 MG tablet Commonly known as:  COMPAZINE     TAKE these medications     Indication  divalproex 500 MG 24 hr tablet Commonly known as:  DEPAKOTE ER Take 1 tablet (500 mg total) by mouth at bedtime. For mood control  Indication:   mood stability   gabapentin 300 MG capsule Commonly known as:  NEURONTIN Take 1 capsule (300 mg total) by mouth 3 (three) times daily. For pain What changed:  additional instructions  Indication:  Neuropathic Pain   JOLESSA 0.15-0.03 MG tablet Generic drug:  levonorgestrel-ethinyl estradiol TAKE ONE TABLET BY MOUTH ONCE DAILY What changed:    how much to take  how to take this  when to take this  Indication:  Birth Control Treatment   methocarbamol 750 MG tablet Commonly known as:  ROBAXIN Take 1 tablet (750 mg total) by mouth 2 (two) times daily as needed for muscle spasms.  Indication:  Musculoskeletal Pain   topiramate 50 MG tablet Commonly known as:  TOPAMAX Take 1 tablet (50 mg total) by mouth at bedtime.  Indication:  Migraine Headache   traZODone 50 MG tablet Commonly known as:  DESYREL Take 1 tablet (50 mg total) by mouth at bedtime. For sleep What changed:  additional instructions  Indication:  Trouble Sleeping   valACYclovir 500 MG tablet Commonly known as:  VALTREX TAKE ONE TABLET BY MOUTH ONCE DAILY What changed:    how much to take  how to take this  when to take this  Indication:  Herpes Simplex Infection   venlafaxine XR 150 MG 24 hr capsule Commonly known as:  EFFEXOR-XR Take 1 capsule (150 mg total) by mouth daily with breakfast. For mood control Start taking on:  11/12/2017 What changed:    medication strength  how much to take  when to take this  additional instructions  Another medication with the same name was removed. Continue taking this medication, and follow the directions you see here.  Indication:  mood stability      Follow-up Information    Chucky May, MD. Go in 4 day(s).   Specialty:  Psychiatry Why:  Follow-up appointment is 11/14/17 at 3:15pm Contact information: [address] [place] [address] [phone]        [facility] Follow up.   Why:  This is a  new address.  Next appointment with Aquilla Solian is  Contact information: [address]   [place] [address]   [phone]         Follow-up recommendations: Activity:  As tolerated Diet: As  recommended by your primary care doctor. Keep all scheduled follow-up appointments as recommended.   Comments: Patient is instructed prior to discharge to: Take all medications as prescribed by his/her mental healthcare provider. Report any adverse effects and or reactions from the medicines to his/her outpatient provider promptly. Patient has been instructed & cautioned: To not engage in alcohol and or illegal drug use while on prescription medicines. In the event of worsening symptoms, patient is instructed to call the crisis hotline, 911 and or go to the nearest ED for appropriate evaluation and treatment of symptoms. To follow-up with his/her primary care provider for your other medical issues, concerns and or health care needs.   Signed: Lindell Spar, NP, PMHNP, FNP-BC 11/11/2017, 3:37 PM   Patient seen, Suicide Assessment Completed.  Disposition Plan Reviewed

## 2017-11-11 NOTE — Progress Notes (Signed)
Pt attend wrap up group. Her day was 8. Her goal meet with the doctor work on her treatment and discuss medication and she trying to move forward.

## 2017-11-11 NOTE — Progress Notes (Signed)
Recreation Therapy Notes  Date: 11/11/17 Time: 0930 Location: [address] Dayroom  Group Topic: Stress Management  Goal Area(s) Addresses:  Patient will verbalize importance of using healthy stress management.  Patient will identify positive emotions associated with healthy stress management.   Behavioral Response: Engaged  Intervention: Stress Management  Activity :  [employer].  LRT played a meditation that explained the concept of taking on the characteristics of mountains (resilience, steady, immoveable) in daily life.  Patients were to follow along as the meditation was read to engage in the practice.  Education:  Stress Management, Discharge Planning.   Education Outcome: Acknowledges edcuation/In group clarification offered/Needs additional education  Clinical Observations/Feedback: Pt attended group.    Victorino Sparrow, LRT/CTRS         Ria Comment, Edu On A 11/11/2017 10:40 AM

## 2017-11-14 DIAGNOSIS — F332 Major depressive disorder, recurrent severe without psychotic features: Secondary | ICD-10-CM | POA: Diagnosis not present

## 2017-11-14 DIAGNOSIS — F410 Panic disorder [episodic paroxysmal anxiety] without agoraphobia: Secondary | ICD-10-CM | POA: Diagnosis not present

## 2017-11-21 ENCOUNTER — Ambulatory Visit: Payer: BLUE CROSS/BLUE SHIELD | Admitting: Neurology

## 2017-11-28 ENCOUNTER — Encounter: Payer: Self-pay | Admitting: Family

## 2017-11-28 ENCOUNTER — Ambulatory Visit: Payer: BLUE CROSS/BLUE SHIELD | Admitting: Family

## 2017-11-28 VITALS — BP 116/72 | HR 109 | Temp 99.7°F | Resp 16 | Ht 64.0 in | Wt 229.0 lb

## 2017-11-28 DIAGNOSIS — J40 Bronchitis, not specified as acute or chronic: Secondary | ICD-10-CM

## 2017-11-28 DIAGNOSIS — J329 Chronic sinusitis, unspecified: Secondary | ICD-10-CM | POA: Diagnosis not present

## 2017-11-28 MED ORDER — DOXYCYCLINE HYCLATE 100 MG PO TABS: 100.0000 mg | ORAL_TABLET | Freq: Two times a day (BID) | ORAL | 0 refills | Status: DC

## 2017-11-28 MED ORDER — BENZONATATE 100 MG PO CAPS: 100.0000 mg | ORAL_CAPSULE | Freq: Three times a day (TID) | ORAL | 0 refills | Status: DC | PRN

## 2017-11-28 NOTE — Patient Instructions (Signed)
Begin doxycycline for bronchitis/sinusitis. Continue zyrtec once daily. Restart flonase 2 sprays each nostril once daily. You may use tessalon as needed for cough. Call if new/worsening symptoms or if not improved in 3-4 days.

## 2017-11-28 NOTE — Progress Notes (Signed)
Subjective:    Patient ID: Gina James, female    DOB: 1981/04/02, 37 y.o.   MRN: [id number]  HPI  [name] is a 37 yr old female who presents today with c/o nasal congestion and productive cough. Symptoms began in January, coughing up green mucous.  She reports that she does not feel sick. She has tried sudafed and a cold and allergy comb pill. Started zyrtec on Friday. Had some itching swelling surrounding the right eye. Nasal drainage is green.    Review of Systems See HPI  Past Medical History:  Diagnosis Date  . Ankle fracture, right   . Anxiety   . Arthritis    "in my back" (04/08/2015)  . Chronic lower back pain   . Complication of anesthesia    difficulty urinating after anesthesia  . Headache    "monthly" (04/08/2015)  . Post partum depression      Social History   Socioeconomic History  . Marital status: Married    Spouse name: Not on file  . Number of children: Not on file  . Years of education: Not on file  . Highest education level: Not on file  Occupational History  . Not on file  Social Needs  . Financial resource strain: Not on file  . Food insecurity:    Worry: Not on file    Inability: Not on file  . Transportation needs:    Medical: Not on file    Non-medical: Not on file  Tobacco Use  . Smoking status: Former Smoker    Packs/day: 0.12    Years: 10.00    Pack years: 1.20    Types: Cigarettes    Last attempt to quit: 09/06/2004    Years since quitting: 13.2  . Smokeless tobacco: Never Used  Substance and Sexual Activity  . Alcohol use: Yes    Alcohol/week: 2.4 oz    Types: 4 Cans of beer per week    Comment: 04/08/2015 "drink only the weekends"  . Drug use: No  . Sexual activity: Yes    Comment: vasectomy in partner  Lifestyle  . Physical activity:    Days per week: Not on file    Minutes per session: Not on file  . Stress: Not on file  Relationships  . Social connections:    Talks on phone: Not on file    Gets together: Not on  file    Attends religious service: Not on file    Active member of club or organization: Not on file    Attends meetings of clubs or organizations: Not on file    Relationship status: Not on file  . Intimate partner violence:    Fear of current or ex partner: Not on file    Emotionally abused: Not on file    Physically abused: Not on file    Forced sexual activity: Not on file  Other Topics Concern  . 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        Past Surgical History:  Procedure Laterality Date  . ANTERIOR FUSION LUMBAR SPINE  07/06/2017  . BREAST SURGERY    . CESAREAN SECTION  2007 and 2009  . COMBINED ABDOMINOPLASTY AND LIPOSUCTION  12/2014   "liposuction in thighs"  . FRACTURE  SURGERY    . LAPAROSCOPIC ABDOMINAL EXPLORATION  1997   "to check for endometriosis"  . LAPAROSCOPIC CHOLECYSTECTOMY  2007  . OPEN REDUCTION INTERNAL FIXATION (ORIF) TIBIA/FIBULA FRACTURE Right 04/08/2015   Procedure: OPEN REDUCTION INTERNAL FIXATION (ORIF)  RIGHT TIBIA PILON FRACTURE;  Surgeon: Wylene Simmer, MD;  Location: [place];  Service: Orthopedics;  Laterality: Right;  . TIBIA FRACTURE SURGERY Right 04/08/2015   ORIF tibial pilon    Family History  Problem Relation Age of Onset  . Diabetes Father   . Cancer Sister 30       breast, had chemo/double mastectomy  . Heart disease Maternal Grandfather   . Heart disease Paternal Grandfather     Allergies  Allergen Reactions  . Ciprofloxacin Hives  . Lexapro [Escitalopram] Nausea And Vomiting  . Amoxicillin Hives and Other (See Comments)    Has patient had a PCN reaction causing immediate rash, facial/tongue/throat swelling, SOB or lightheadedness with hypotension: no Has patient had a PCN reaction causing severe rash involving mucus membranes or skin necrosis: no Has  patient had a PCN reaction that required hospitalization: no Has patient had a PCN reaction occurring within the last 10 years: no - childhood reaction If all of the above answers are "NO", then may proceed with Cephalosporin use.   . Ceclor [Cefaclor] Hives  . Erythromycin Base Rash  . Penicillins Hives and Other (See Comments)    Has patient had a PCN reaction causing immediate rash, facial/tongue/throat swelling, SOB or lightheadedness with hypotension: no Has patient had a PCN reaction causing severe rash involving mucus membranes or skin necrosis: no Has patient had a PCN reaction that required hospitalization: no Has patient had a PCN reaction occurring within the last 10 years: no - childhood reaction If all of the above answers are "NO", then may proceed with Cephalosporin use.  Marland Kitchen Phenergan [Promethazine Hcl] Hives  . Sulfa Antibiotics Hives  . Zithromax [Azithromycin] Hives    Current Outpatient Medications on File Prior to Visit  Medication Sig Dispense Refill  . gabapentin (NEURONTIN) 300 MG capsule Take 1 capsule (300 mg total) by mouth 3 (three) times daily. For pain 30 capsule 0  . JOLESSA 0.15-0.03 MG tablet TAKE ONE TABLET BY MOUTH ONCE DAILY (Patient taking differently: TAKE ONE TABLET BY MOUTH AT BEDTIME.) 91 tablet 4  . methocarbamol (ROBAXIN) 750 MG tablet Take 1 tablet (750 mg total) by mouth 2 (two) times daily as needed for muscle spasms. 60 tablet 1  . venlafaxine XR (EFFEXOR-XR) 150 MG 24 hr capsule Take 1 capsule (150 mg total) by mouth daily with breakfast. For mood control 30 capsule 0  . divalproex (DEPAKOTE ER) 500 MG 24 hr tablet Take 1 tablet (500 mg total) by mouth at bedtime. For mood control (Patient not taking: Reported on 11/28/2017) 30 tablet 0  . topiramate (TOPAMAX) 50 MG tablet Take 1 tablet (50 mg total) by mouth at bedtime. (Patient not taking: Reported on 11/28/2017) 30 tablet 2  . traZODone (DESYREL) 50 MG tablet Take 1 tablet (50 mg total) by mouth  at bedtime. For sleep (Patient not taking: Reported on 11/28/2017) 30 tablet 0  . valACYclovir (VALTREX) 500 MG tablet TAKE ONE TABLET BY MOUTH ONCE DAILY (Patient not taking: No sig reported) 30 tablet 5   No current facility-administered medications on file prior to visit.     BP 116/72 (BP Location: Right Arm, Patient Position: Sitting, Cuff Size: Large)   Pulse (!) 109   Temp 99.7 F (37.6  C) (Oral)   Resp 16   Ht 5\' 4"  (1.626 m)   Wt 229 lb (103.9 kg)   SpO2 100%   BMI 39.31 kg/m       Objective:   Physical Exam  Constitutional: She appears well-developed and well-nourished.  HENT:  Nose: Right sinus exhibits no maxillary sinus tenderness and no frontal sinus tenderness. Left sinus exhibits no maxillary sinus tenderness and no frontal sinus tenderness.  Serous effusion R TM, no erythema. L TM is dull  Cardiovascular: Normal rate, regular rhythm and normal heart sounds.  No murmur heard. Pulmonary/Chest: Effort normal. No respiratory distress.  Coarse upper airway rhonchi noted.   Psychiatric: She has a normal mood and affect. Her behavior is normal. Judgment and thought content normal.          Assessment & Plan:  Bronchitis/sinusitis- multiple drug allergies.  Advised pt as follows:  Begin doxycycline for bronchitis/sinusitis. Continue zyrtec once daily. Restart flonase 2 sprays each nostril once daily. You may use tessalon as needed for cough. Call if new/worsening symptoms or if not improved in 3-4 days.

## 2017-12-02 DIAGNOSIS — M542 Cervicalgia: Secondary | ICD-10-CM | POA: Insufficient documentation

## 2017-12-02 DIAGNOSIS — M5020 Other cervical disc displacement, unspecified cervical region: Secondary | ICD-10-CM | POA: Insufficient documentation

## 2017-12-02 DIAGNOSIS — M50221 Other cervical disc displacement at C4-C5 level: Secondary | ICD-10-CM | POA: Diagnosis not present

## 2017-12-02 DIAGNOSIS — M50322 Other cervical disc degeneration at C5-C6 level: Secondary | ICD-10-CM | POA: Diagnosis not present

## 2017-12-02 HISTORY — DX: Other cervical disc displacement, unspecified cervical region: M50.20

## 2017-12-02 HISTORY — DX: Cervicalgia: M54.2

## 2018-01-05 DIAGNOSIS — M5020 Other cervical disc displacement, unspecified cervical region: Secondary | ICD-10-CM | POA: Diagnosis not present

## 2018-02-13 ENCOUNTER — Encounter: Payer: Self-pay | Admitting: Family Medicine

## 2018-02-15 ENCOUNTER — Other Ambulatory Visit: Payer: Self-pay

## 2018-02-15 ENCOUNTER — Inpatient Hospital Stay (HOSPITAL_COMMUNITY)
Admission: RE | Admit: 2018-02-15 | Discharge: 2018-02-22 | DRG: 885 | Disposition: A | Discharge status: Home / Self Care | Payer: BLUE CROSS/BLUE SHIELD | Attending: Psychiatry | Admitting: Psychiatry

## 2018-02-15 ENCOUNTER — Encounter (HOSPITAL_COMMUNITY): Payer: Self-pay | Admitting: *Deleted

## 2018-02-15 DIAGNOSIS — E669 Obesity, unspecified: Secondary | ICD-10-CM | POA: Diagnosis present

## 2018-02-15 DIAGNOSIS — F1290 Cannabis use, unspecified, uncomplicated: Secondary | ICD-10-CM | POA: Diagnosis not present

## 2018-02-15 DIAGNOSIS — F332 Major depressive disorder, recurrent severe without psychotic features: Secondary | ICD-10-CM | POA: Diagnosis not present

## 2018-02-15 DIAGNOSIS — F3163 Bipolar disorder, current episode mixed, severe, without psychotic features: Secondary | ICD-10-CM | POA: Diagnosis not present

## 2018-02-15 DIAGNOSIS — Z91410 Personal history of adult physical and sexual abuse: Secondary | ICD-10-CM

## 2018-02-15 DIAGNOSIS — Z881 Allergy status to other antibiotic agents status: Secondary | ICD-10-CM | POA: Diagnosis not present

## 2018-02-15 DIAGNOSIS — F319 Bipolar disorder, unspecified: Secondary | ICD-10-CM | POA: Diagnosis present

## 2018-02-15 DIAGNOSIS — Z888 Allergy status to other drugs, medicaments and biological substances status: Secondary | ICD-10-CM

## 2018-02-15 DIAGNOSIS — Z880 Allergy status to penicillin: Secondary | ICD-10-CM

## 2018-02-15 DIAGNOSIS — Z630 Problems in relationship with spouse or partner: Secondary | ICD-10-CM | POA: Diagnosis not present

## 2018-02-15 DIAGNOSIS — R45850 Homicidal ideations: Secondary | ICD-10-CM | POA: Diagnosis present

## 2018-02-15 DIAGNOSIS — F410 Panic disorder [episodic paroxysmal anxiety] without agoraphobia: Secondary | ICD-10-CM | POA: Diagnosis not present

## 2018-02-15 DIAGNOSIS — R45851 Suicidal ideations: Secondary | ICD-10-CM

## 2018-02-15 DIAGNOSIS — Z882 Allergy status to sulfonamides status: Secondary | ICD-10-CM | POA: Diagnosis not present

## 2018-02-15 DIAGNOSIS — Z87891 Personal history of nicotine dependence: Secondary | ICD-10-CM

## 2018-02-15 DIAGNOSIS — Z981 Arthrodesis status: Secondary | ICD-10-CM | POA: Diagnosis not present

## 2018-02-15 DIAGNOSIS — Z6841 Body Mass Index (BMI) 40.0 and over, adult: Secondary | ICD-10-CM

## 2018-02-15 DIAGNOSIS — Z79899 Other long term (current) drug therapy: Secondary | ICD-10-CM | POA: Diagnosis not present

## 2018-02-15 DIAGNOSIS — Z818 Family history of other mental and behavioral disorders: Secondary | ICD-10-CM

## 2018-02-15 MED ORDER — LEVONORGEST-ETH ESTRAD 91-DAY 0.15-0.03 MG PO TABS
1.0000 | ORAL_TABLET | Freq: Every day | ORAL | Status: DC
  Administered 2018-02-15 – 2018-02-21 (×7): 1 via ORAL

## 2018-02-15 MED ORDER — METHOCARBAMOL 750 MG PO TABS
750.0000 mg | ORAL_TABLET | Freq: Two times a day (BID) | ORAL | Status: DC | PRN
  Administered 2018-02-15 – 2018-02-22 (×12): 750 mg via ORAL
  Filled 2018-02-15 (×12): qty 1

## 2018-02-15 MED ORDER — VENLAFAXINE HCL ER 75 MG PO CP24
225.0000 mg | ORAL_CAPSULE | Freq: Every day | ORAL | Status: DC
  Administered 2018-02-15 – 2018-02-21 (×7): 225 mg via ORAL
  Filled 2018-02-15 (×9): qty 3

## 2018-02-15 MED ORDER — GABAPENTIN 300 MG PO CAPS
300.0000 mg | ORAL_CAPSULE | Freq: Three times a day (TID) | ORAL | Status: DC
  Administered 2018-02-15 – 2018-02-22 (×21): 300 mg via ORAL
  Filled 2018-02-15 (×27): qty 1

## 2018-02-15 NOTE — Progress Notes (Signed)
Gina James is a 37 year old female pt admitted on voluntary basis after presenting as a walk-in. On admission she reports feeling depressed, suicidal and homicidal. She spoke about how she has been having issues with her husband and spoke about feeling homicidal towards him. She spoke about how she was hospitalized at [facility] in March and reports that she did not have a good stay. She reports that the medications that she was taking here did not agree with her and reports she started feeling more suicidal when she was on them. She reports that she goes to [facility] outpatient and she reports that she changed all her medications and reports that she feels they are working ok. She also reports that she has a neurologist through [facility] and a PCP who both prescribed her medications. She reports that she is living with her husband and 2 children but reports that she is unsure where she will go once she is discharged. Gina James was oriented to the unit and safety maintained.

## 2018-02-15 NOTE — Tx Team (Signed)
Initial Treatment Plan 02/15/2018 1:52 PM Gina James WIO:[id number]    PATIENT STRESSORS: Marital or family conflict Medication change or noncompliance   PATIENT STRENGTHS: Ability for insight Average or above average intelligence Capable of independent living General fund of knowledge Motivation for treatment/growth   PATIENT IDENTIFIED PROBLEMS: Depression Anxiety Suicidal/Homicidal thoughts "Just severely depressed"                     DISCHARGE CRITERIA:  Ability to meet basic life and health needs Improved stabilization in mood, thinking, and/or behavior Reduction of life-threatening or endangering symptoms to within safe limits Verbal commitment to aftercare and medication compliance  PRELIMINARY DISCHARGE PLAN: Attend aftercare/continuing care group  PATIENT/FAMILY INVOLVEMENT: This treatment plan has been presented to and reviewed with the patient, Gina James, and/or family member, .  The patient and family have been given the opportunity to ask questions and make suggestions.  Geronimo Diliberto, [place], [place] 02/15/2018, 1:52 PM
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
## 2018-02-15 NOTE — Progress Notes (Signed)
Pt is observed in the dayroom, attending wrap-up group. Pt appears flat/depressed in affect and mood. Pt endorses passive SI but verbal contracts for safety. Pt endorses HI towards husband. Pt denies AVH/Pain. Pt spoke of how her husband been cheating on her. Pt states she doesn't want to leave home because she won't be able to see her kids. Pt spoke of how she is unemployed and has no support. Pt states her family lives in [name] and that she has no friends. Schedule medications given. Pt was given a workbook and was encourage to complete it. Will continue with POC.

## 2018-02-16 ENCOUNTER — Encounter (HOSPITAL_COMMUNITY): Payer: Self-pay | Admitting: Behavioral Health

## 2018-02-16 DIAGNOSIS — R45851 Suicidal ideations: Secondary | ICD-10-CM

## 2018-02-16 DIAGNOSIS — F319 Bipolar disorder, unspecified: Secondary | ICD-10-CM

## 2018-02-16 MED ORDER — LOPERAMIDE HCL 2 MG PO CAPS
2.0000 mg | ORAL_CAPSULE | ORAL | Status: DC | PRN
  Administered 2018-02-16: 2 mg via ORAL
  Filled 2018-02-16: qty 1

## 2018-02-16 MED ORDER — ARIPIPRAZOLE 10 MG PO TABS
10.0000 mg | ORAL_TABLET | Freq: Every day | ORAL | Status: DC
  Administered 2018-02-16 – 2018-02-18 (×3): 10 mg via ORAL
  Filled 2018-02-16 (×7): qty 1
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
## 2018-02-16 NOTE — Progress Notes (Signed)
[facility] Notes:  (Nursing/MHT/Case Management/Adjunct)  Date:  02/16/2018  Time:  2030  Type of Therapy:  wrap up group  Participation Level:  Active  Participation Quality:  Appropriate, Attentive, Sharing and Supportive  Affect:  Appropriate  Cognitive:  Appropriate  Insight:  Improving  Engagement in Group:  Engaged  Modes of Intervention:  Clarification, Education and Support  Summary of Progress/Problems:  Shellia Cleverly 02/16/2018, 9:03 PM

## 2018-02-16 NOTE — Progress Notes (Signed)
Patient ID: Gina James, female   DOB: 1981-03-14, 37 y.o.   MRN: [id number]  D: Patient reported back and neck pain of 5/10 this morning, verbalizes +SI, denies having a plan, and is able to verbally contract for safety on the unit.  Pt currently denies HI/AVH, reports that her sleep quality last night was fair.  Affect is blunted, mood is depressed, pt reports a fair appetite, denies having any other concerns.  A: Pt educated all on of her medications, given all as scheduled.  Pt medicated with Robaxin 750mg  for back and shoulder pain.  Pt educated on the need to seek staff's assistance with any concerns, and to let staff know if her suicidal thoughts worsens.  Pt verbalizes understanding of all. Pt is being maintained on Q15 minute checks for safety.  R: Will continue to monitor on Q15 minute checks for safety.

## 2018-02-16 NOTE — [facility] Group Notes (Signed)
[facility] Group Notes:  (Nursing)  Date:  02/16/2018  Time:400 P Type of Therapy:  Nurse Education  Participation Level:  Active  Participation Quality:  Appropriate  Affect:  Appropriate  Cognitive:  Appropriate  Insight:  Appropriate and Good  Engagement in Group:  Engaged  Modes of Intervention:  Discussion and Exploration  Summary of Progress/Problems:  Gina James 02/16/2018, 7:29 PM

## 2018-02-16 NOTE — Progress Notes (Signed)
Pt attend wrap up group. Her day was a  1. She has no goal. Upon her admission she did not tell admission nurse she wants to work on anything while she's here.
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
## 2018-02-16 NOTE — Progress Notes (Signed)
D. Pt observed attending group this evening and interacting well with peers. Pt reports being more hopeful today. Pt has been calm and cooperative this evening Pt currently denies SI/HI and AV hallucinations. A. Labs and vitals monitored. Pt compliant with medications. Pt supported emotionally and encouraged to express concerns and ask questions.   R. Pt remains safe with 15 minute checks. Will continue POC.

## 2018-02-16 NOTE — [facility] Group Notes (Signed)
[place] [place] LCSW Group Therapy Note  Date/Time: 02/16/18, 1315  Type of Therapy/Topic:  Group Therapy:  Balance in Life  Participation Level:  active  Description of Group:    This group will address the concept of balance and how it feels and looks when one is unbalanced. Patients will be encouraged to process areas in their lives that are out of balance, and identify reasons for remaining unbalanced. Facilitators will guide patients utilizing problem- solving interventions to address and correct the stressor making their life unbalanced. Understanding and applying boundaries will be explored and addressed for obtaining  and maintaining a balanced life. Patients will be encouraged to explore ways to assertively make their unbalanced needs known to significant others in their lives, using other group members and facilitator for support and feedback.  Therapeutic Goals: 1. Patient will identify two or more emotions or situations they have that consume much of in their lives. 2. Patient will identify signs/triggers that life has become out of balance:  3. Patient will identify two ways to set boundaries in order to achieve balance in their lives:  4. Patient will demonstrate ability to communicate their needs through discussion and/or role plays  Summary of Patient Progress:Pt shared that family, mental/emotional, and physical are areas that are out of balance in her life.  Pt was active in group discussion regarding recognizing and addressing areas of life that are out of balance.          Therapeutic Modalities:   Cognitive Behavioral Therapy Solution-Focused Therapy Assertiveness Training  Lurline Idol, [place]
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
## 2018-02-17 LAB — COMPREHENSIVE METABOLIC PANEL
ALBUMIN: 4.2 g/dL (ref 3.5–5.0)
ALK PHOS: 64 U/L (ref 38–126)
ALT: 38 U/L (ref 14–54)
ANION GAP: 11 (ref 5–15)
AST: 29 U/L (ref 15–41)
BILIRUBIN TOTAL: 1 mg/dL (ref 0.3–1.2)
BUN: 12 mg/dL (ref 6–20)
CALCIUM: 9.4 mg/dL (ref 8.9–10.3)
CO2: 21 mmol/L — ABNORMAL LOW (ref 22–32)
Chloride: 106 mmol/L (ref 101–111)
Creatinine, Ser: 0.85 mg/dL (ref 0.44–1.00)
GFR calc Af Amer: >60 mL/min (ref >60)
GLUCOSE: 129 mg/dL — AB (ref 65–99)
Potassium: 3.7 mmol/L (ref 3.5–5.1)
Sodium: 138 mmol/L (ref 135–145)
Total Protein: 7.6 g/dL (ref 6.5–8.1)

## 2018-02-17 LAB — CBC WITH DIFFERENTIAL/PLATELET
BASOS PCT: 0 %
Basophils Absolute: 0 10*3/uL (ref 0.0–0.1)
Eosinophils Absolute: 0.5 10*3/uL (ref 0.0–0.7)
Eosinophils Relative: 6 %
HEMATOCRIT: 44.3 % (ref 36.0–46.0)
HEMOGLOBIN: 15 g/dL (ref 12.0–15.0)
LYMPHS PCT: 37 %
Lymphs Abs: 3.1 10*3/uL (ref 0.7–4.0)
MCH: 29.1 pg (ref 26.0–34.0)
MCHC: 33.9 g/dL (ref 30.0–36.0)
MCV: 86 fL (ref 78.0–100.0)
MONO ABS: 0.8 10*3/uL (ref 0.1–1.0)
MONOS PCT: 9 %
NEUTROS ABS: 3.9 10*3/uL (ref 1.7–7.7)
NEUTROS PCT: 48 %
Platelets: 270 10*3/uL (ref 150–400)
RBC: 5.15 MIL/uL — ABNORMAL HIGH (ref 3.87–5.11)
RDW: 12.9 % (ref 11.5–15.5)
WBC: 8.3 10*3/uL (ref 4.0–10.5)

## 2018-02-17 LAB — URINALYSIS, COMPLETE (UACMP) WITH MICROSCOPIC
Bilirubin Urine: NEGATIVE
GLUCOSE, UA: NEGATIVE mg/dL
Ketones, ur: NEGATIVE mg/dL
Leukocytes, UA: NEGATIVE
Nitrite: NEGATIVE
PH: 5 (ref 5.0–8.0)
Protein, ur: NEGATIVE mg/dL
SPECIFIC GRAVITY, URINE: 1.019 (ref 1.005–1.030)

## 2018-02-17 LAB — RAPID URINE DRUG SCREEN, HOSP PERFORMED
Amphetamines: NOT DETECTED
Benzodiazepines: POSITIVE — AB
COCAINE: NOT DETECTED
OPIATES: NOT DETECTED
Tetrahydrocannabinol: POSITIVE — AB

## 2018-02-17 LAB — PREGNANCY, URINE: Preg Test, Ur: NEGATIVE

## 2018-02-17 NOTE — Tx Team (Signed)
Interdisciplinary Treatment and Diagnostic Plan Update  02/17/2018 Time of Session: 9:30am Gina James MRN: [id number]  Principal Diagnosis: Bipolar disorder, unspecified ([place])  Secondary Diagnoses: Principal Problem:   Bipolar disorder, unspecified ([place]) Active Problems:   MDD (major depressive disorder), recurrent severe, without psychosis ([place])   Suicidal ideation   Current Medications:  Current Facility-Administered Medications  Medication Dose Route Frequency Provider Last Rate Last Dose  . ARIPiprazole (ABILIFY) tablet 10 mg  10 mg Oral Daily Cobos, Myer Peer, MD   10 mg at 02/16/18 1605  . gabapentin (NEURONTIN) capsule 300 mg  300 mg Oral TID Rankin, Shuvon B, NP   300 mg at 02/17/18 0802  . levonorgestrel-ethinyl estradiol (SEASONALE,INTROVALE,JOLESSA) 0.15-0.03 MG per tablet 1 tablet  1 tablet Oral QHS Rankin, Shuvon B, NP   1 tablet at 02/16/18 2130  . loperamide (IMODIUM) capsule 2 mg  2 mg Oral PRN Mordecai Maes, NP   2 mg at 02/16/18 1452  . methocarbamol (ROBAXIN) tablet 750 mg  750 mg Oral BID PRN Rankin, Shuvon B, NP   750 mg at 02/17/18 0802  . venlafaxine XR (EFFEXOR-XR) 24 hr capsule 225 mg  225 mg Oral Q breakfast Rankin, Shuvon B, NP   225 mg at 02/16/18 2129   PTA Medications: Medications Prior to Admission  Medication Sig Dispense Refill Last Dose  . gabapentin (NEURONTIN) 300 MG capsule Take 1 capsule (300 mg total) by mouth 3 (three) times daily. For pain 30 capsule 0 Taking  . JOLESSA 0.15-0.03 MG tablet TAKE ONE TABLET BY MOUTH ONCE DAILY (Patient taking differently: TAKE ONE TABLET BY MOUTH AT BEDTIME.) 91 tablet 4 Taking  . methocarbamol (ROBAXIN) 750 MG tablet Take 1 tablet (750 mg total) by mouth 2 (two) times daily as needed for muscle spasms. 60 tablet 1 Taking  . venlafaxine XR (EFFEXOR-XR) 150 MG 24 hr capsule Take 1 capsule (150 mg total) by mouth daily with breakfast. For mood control 30 capsule 0 Taking    Patient Stressors: Marital or  family conflict Medication change or noncompliance  Patient Strengths: Ability for insight Average or above average intelligence Capable of independent living General fund of knowledge Motivation for treatment/growth  Treatment Modalities: Medication Management, Group therapy, Case management,  1 to 1 session with clinician, Psychoeducation, Recreational therapy.   Physician Treatment Plan for Primary Diagnosis: Bipolar disorder, unspecified ([place]) Long Term Goal(s): Improvement in symptoms so as ready for discharge Improvement in symptoms so as ready for discharge   Short Term Goals: Ability to identify changes in lifestyle to reduce recurrence of condition will improve Ability to demonstrate self-control will improve Ability to identify and develop effective coping behaviors will improve Ability to identify triggers associated with substance abuse/mental health issues will improve Ability to disclose and discuss suicidal ideas Ability to demonstrate self-control will improve Ability to identify and develop effective coping behaviors will improve Ability to maintain clinical measurements within normal limits will improve Compliance with prescribed medications will improve  Medication Management: Evaluate patient's response, side effects, and tolerance of medication regimen.  Therapeutic Interventions: 1 to 1 sessions, Unit Group sessions and Medication administration.  Evaluation of Outcomes: Not Met  Physician Treatment Plan for Secondary Diagnosis: Principal Problem:   Bipolar disorder, unspecified ([place]) Active Problems:   MDD (major depressive disorder), recurrent severe, without psychosis ([place])   Suicidal ideation  Long Term Goal(s): Improvement in symptoms so as ready for discharge Improvement in symptoms so as ready for discharge   Short Term Goals: Ability  to identify changes in lifestyle to reduce recurrence of condition will improve Ability to demonstrate  self-control will improve Ability to identify and develop effective coping behaviors will improve Ability to identify triggers associated with substance abuse/mental health issues will improve Ability to disclose and discuss suicidal ideas Ability to demonstrate self-control will improve Ability to identify and develop effective coping behaviors will improve Ability to maintain clinical measurements within normal limits will improve Compliance with prescribed medications will improve     Medication Management: Evaluate patient's response, side effects, and tolerance of medication regimen.  Therapeutic Interventions: 1 to 1 sessions, Unit Group sessions and Medication administration.  Evaluation of Outcomes: Not Met   RN Treatment Plan for Primary Diagnosis: Bipolar disorder, unspecified ([place]) Long Term Goal(s): Knowledge of disease and therapeutic regimen to maintain health will improve  Short Term Goals: Ability to verbalize frustration and anger appropriately will improve, Ability to demonstrate self-control, Ability to disclose and discuss suicidal ideas, Ability to identify and develop effective coping behaviors will improve and Compliance with prescribed medications will improve  Medication Management: RN will administer medications as ordered by provider, will assess and evaluate patient's response and provide education to patient for prescribed medication. RN will report any adverse and/or side effects to prescribing provider.  Therapeutic Interventions: 1 on 1 counseling sessions, Psychoeducation, Medication administration, Evaluate responses to treatment, Monitor vital signs and CBGs as ordered, Perform/monitor CIWA, COWS, AIMS and Fall Risk screenings as ordered, Perform wound care treatments as ordered.  Evaluation of Outcomes: Not Met   LCSW Treatment Plan for Primary Diagnosis: Bipolar disorder, unspecified ([place]) Long Term Goal(s): Safe transition to appropriate next level  of care at discharge, Engage patient in therapeutic group addressing interpersonal concerns.  Short Term Goals: Engage patient in aftercare planning with referrals and resources, Increase social support, Increase ability to appropriately verbalize feelings, Increase emotional regulation, Facilitate acceptance of mental health diagnosis and concerns, Facilitate patient progression through stages of change regarding substance use diagnoses and concerns, Identify triggers associated with mental health/substance abuse issues and Increase skills for wellness and recovery  Therapeutic Interventions: Assess for all discharge needs, 1 to 1 time with Social worker, Explore available resources and support systems, Assess for adequacy in community support network, Educate family and significant other(s) on suicide prevention, Complete Psychosocial Assessment, Interpersonal group therapy.  Evaluation of Outcomes: Not Met   Progress in Treatment: Attending groups: Yes. Participating in groups: Yes. Taking medication as prescribed: Yes. Toleration medication: Yes. Family/Significant other contact made: No, will contact:  patient refused consent for a collateral contact Patient understands diagnosis: Yes. Discussing patient identified problems/goals with staff: Yes. Medical problems stabilized or resolved: Yes. Denies suicidal/homicidal ideation: Yes. Issues/concerns per patient self-inventory: No. Other:   New problem(s) identified: None  New Short Term/Long Term Goal(s):Detox, medication stabilization, elimination of SI thoughts, development of comprehensive mental wellness plan.   Patient Goals: "Work on my emotions, take my medications and figure out my next step"  Discharge Plan or Barriers: CSW will continue to assess for an appropriate discharge plan  Reason for Continuation of Hospitalization: Anxiety Depression Medication stabilization Suicidal ideation  Estimated Length of Stay:  Wednesday, 02/22/18  Attendees: Patient: Gina James 02/17/2018 8:32 AM  Physician: Dr. Neita Garnet, MD 02/17/2018 8:32 AM  Nursing: Yetta Flock RN 02/17/2018 8:32 AM  RN Care Manager: Rhunette Croft 02/17/2018 8:32 AM  Social Worker: [name], Latanya Presser 02/17/2018 8:32 AM  Recreational Therapist: Rhunette Croft 02/17/2018 8:32 AM  Other: Rhunette Croft 02/17/2018 8:32 AM  Other: Rhunette Croft  02/17/2018 8:32 AM  Other:X 02/17/2018 8:32 AM    Scribe for Treatment Team: Marylee Floras, [place] 02/17/2018 8:32 AM

## 2018-02-17 NOTE — Progress Notes (Signed)
[facility] MD Progress Note  02/17/2018 3:51 PM Gina James  MRN:  [id number] Subjective:  Patient reports she is feeling better today. States she is feeling less irritable. States " I wrote down how I felt, put down my anger in words, and I am going to burn when I am discharged as  a symbol of letting anger go". States she feels she is dealing with emotional pain related to marital discord " a lot better now". Denies medication side effects. Denies suicidal ideations. Objective: I have discussed case with treatment team and have met with patient. 37 year old married female, two children, presented due to worsening mood ( including depression but also anger and irritability) suicidal ideations in the context of significant marital stressors. History of a prior admission in March 2019. As above, today reports feeling better, less depressed, less irritable, and denies current suicidal ideations. Also denies any violent or homicidal ideations towards husband . Behavior on unit has been in good control, without agitation . Visible in day room, going to groups.  Thus far denies medication side effects. ( Abilify, Effexor XR)   Principal Problem: Bipolar disorder, unspecified ([place]) Diagnosis:   Patient Active Problem List   Diagnosis Date Noted  . Suicidal ideation [R45.851] 02/16/2018  . MDD (major depressive disorder), recurrent severe, without psychosis ([place]) [F33.2] 02/15/2018  . Bipolar disorder, unspecified ([place]) [F31.9] 11/04/2017  . Cervical disc disorder with radiculopathy of cervical region [M50.10] 10/21/2017  . S/P spinal surgery [Z98.890] 08/12/2017  . DDD (degenerative disc disease), lumbar [M51.36] 01/13/2017  . Depression with anxiety [F41.8] 08/04/2016  . 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 PMDD (premenstrual dysphoric disorder) [F32.81] 07/29/2015  . Preventative health care [Z00.00] 10/29/2014  . Obesity [E66.9] 06/10/2014  . Chronic meniscal tear of  knee [M23.209] 11/23/2013  . Lumbar pain with radiation down both legs [M54.5] 09/11/2013  . Generalized anxiety disorder [F41.1] 06/18/2013   Total Time spent with patient: 20 minutes  Past Psychiatric History:  Past Medical History:  Past Medical History:  Diagnosis Date  . Ankle fracture, right   . Anxiety   . Arthritis    "in my back" (04/08/2015)  . Chronic lower back pain   . Complication of anesthesia    difficulty urinating after anesthesia  . Headache    "monthly" (04/08/2015)  . Post partum depression     Past Surgical History:  Procedure Laterality Date  . ANTERIOR FUSION LUMBAR SPINE  07/06/2017  . BREAST SURGERY    . CESAREAN SECTION  2007 and 2009  . COMBINED ABDOMINOPLASTY AND LIPOSUCTION  12/2014   "liposuction in thighs"  . FRACTURE SURGERY    . LAPAROSCOPIC ABDOMINAL EXPLORATION  1997   "to check for endometriosis"  . LAPAROSCOPIC CHOLECYSTECTOMY  2007  . OPEN REDUCTION INTERNAL FIXATION (ORIF) TIBIA/FIBULA FRACTURE Right 04/08/2015   Procedure: OPEN REDUCTION INTERNAL FIXATION (ORIF)  RIGHT TIBIA PILON FRACTURE;  Surgeon: Wylene Simmer, MD;  Location: [facility];  Service: Orthopedics;  Laterality: Right;  . TIBIA FRACTURE SURGERY Right 04/08/2015   ORIF tibial pilon   Family History:  Family History  Problem Relation Age of Onset  . Diabetes Father   . Cancer Sister 30       breast, had chemo/double mastectomy  . Heart disease Maternal Grandfather   . Heart disease Paternal Grandfather    Family Psychiatric  History:  Social History:  Social History   Substance and Sexual Activity  Alcohol Use Not Currently  .  Alcohol/week: 2.4 oz  . Types: 4 Cans of beer per week     Social History   Substance and Sexual Activity  Drug Use Yes  . Types: Marijuana    Social History   Socioeconomic History  . Marital status: Married    Spouse name: Not on file  . Number of children: Not on file  . Years of education: Not on file  . Highest education level: Not  on file  Occupational History  . Not on file  Social Needs  . Financial resource strain: Not on file  . Food insecurity:    Worry: Not on file    Inability: Not on file  . Transportation needs:    Medical: Not on file    Non-medical: Not on file  Tobacco Use  . Smoking status: Former Smoker    Packs/day: 0.12    Years: 10.00    Pack years: 1.20    Types: Cigarettes    Last attempt to quit: 09/06/2004    Years since quitting: 13.4  . Smokeless tobacco: Never Used  Substance and Sexual Activity  . Alcohol use: Not Currently    Alcohol/week: 2.4 oz    Types: 4 Cans of beer per week  . Drug use: Yes    Types: Marijuana  . Sexual activity: Not Currently    Comment: vasectomy in partner  Lifestyle  . Physical activity:    Days per week: Not on file    Minutes per session: Not on file  . Stress: Not on file  Relationships  . Social connections:    Talks on phone: Not on file    Gets together: Not on file    Attends religious service: Not on file    Active member of club or organization: Not on file    Attends meetings of clubs or organizations: Not on file    Relationship status: Not on file  Other Topics Concern  . 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       Additional Social History:    Pain Medications: See MAR Prescriptions: See MAR Over the Counter: See MAR History of alcohol / drug use?: No history of alcohol / drug abuse  Sleep: Good  Appetite:  Good  Current Medications: Current Facility-Administered Medications  Medication Dose Route Frequency Provider Last Rate Last Dose  . ARIPiprazole (ABILIFY) tablet 10 mg  10 mg Oral Daily Cortez Steelman, Myer Peer, MD   10 mg at 02/16/18 1605  . gabapentin (NEURONTIN) capsule 300 mg  300 mg Oral TID Rankin, Shuvon B, NP   300 mg at 02/17/18  1201  . levonorgestrel-ethinyl estradiol (SEASONALE,INTROVALE,JOLESSA) 0.15-0.03 MG per tablet 1 tablet  1 tablet Oral QHS Rankin, Shuvon B, NP   1 tablet at 02/16/18 2130  . loperamide (IMODIUM) capsule 2 mg  2 mg Oral PRN Mordecai Maes, NP   2 mg at 02/16/18 1452  . methocarbamol (ROBAXIN) tablet 750 mg  750 mg Oral BID PRN Rankin, Shuvon B, NP   750 mg at 02/17/18 0802  . venlafaxine XR (EFFEXOR-XR) 24 hr capsule 225 mg  225 mg Oral Q breakfast Rankin, Shuvon B, NP   225 mg at 02/16/18 2129    Lab Results:  Results for orders placed or performed during  the hospital encounter of 02/15/18 (from the past 48 hour(s))  Pregnancy, urine     Status: None   Collection Time: 02/17/18  6:13 AM  Result Value Ref Range   Preg Test, Ur NEGATIVE NEGATIVE    Comment:        THE SENSITIVITY OF THIS METHODOLOGY IS >20 mIU/mL. Performed at [facility], [place] [address] [place], [place] [address]   Urine rapid drug screen (hosp performed)     Status: Abnormal   Collection Time: 02/17/18  6:16 AM  Result Value Ref Range   Opiates NONE DETECTED NONE DETECTED   Cocaine NONE DETECTED NONE DETECTED   Benzodiazepines POSITIVE (A) NONE DETECTED   Amphetamines NONE DETECTED NONE DETECTED   Tetrahydrocannabinol POSITIVE (A) NONE DETECTED   Barbiturates (A) NONE DETECTED    Result not available. Reagent lot number recalled by manufacturer.    Comment: Performed at [facility], [place] [address] [place], [place] [address]  Urinalysis, Complete w Microscopic     Status: Abnormal   Collection Time: 02/17/18  6:16 AM  Result Value Ref Range   Color, Urine YELLOW YELLOW   APPearance CLEAR CLEAR   Specific Gravity, Urine 1.019 1.005 - 1.030   pH 5.0 5.0 - 8.0   Glucose, UA NEGATIVE NEGATIVE mg/dL   Hgb urine dipstick MODERATE (A) NEGATIVE   Bilirubin Urine NEGATIVE NEGATIVE   Ketones, ur NEGATIVE NEGATIVE mg/dL   Protein, ur NEGATIVE NEGATIVE mg/dL   Nitrite NEGATIVE  NEGATIVE   Leukocytes, UA NEGATIVE NEGATIVE   RBC / HPF 0-5 0 - 5 RBC/hpf   WBC, UA 6-10 0 - 5 WBC/hpf   Bacteria, UA MANY (A) NONE SEEN   Squamous Epithelial / LPF 0-5 0 - 5   Mucus PRESENT     Comment: Performed at [facility], [place] [address] [place], [place] [address]  CBC with Differential/Platelet     Status: Abnormal   Collection Time: 02/17/18  6:22 AM  Result Value Ref Range   WBC 8.3 4.0 - 10.5 K/uL   RBC 5.15 (H) 3.87 - 5.11 MIL/uL   Hemoglobin 15.0 12.0 - 15.0 g/dL   HCT 44.3 36.0 - 46.0 %   MCV 86.0 78.0 - 100.0 fL   MCH 29.1 26.0 - 34.0 pg   MCHC 33.9 30.0 - 36.0 g/dL   RDW 12.9 11.5 - 15.5 %   Platelets 270 150 - 400 K/uL   Neutrophils Relative % 48 %   Neutro Abs 3.9 1.7 - 7.7 K/uL   Lymphocytes Relative 37 %   Lymphs Abs 3.1 0.7 - 4.0 K/uL   Monocytes Relative 9 %   Monocytes Absolute 0.8 0.1 - 1.0 K/uL   Eosinophils Relative 6 %   Eosinophils Absolute 0.5 0.0 - 0.7 K/uL   Basophils Relative 0 %   Basophils Absolute 0.0 0.0 - 0.1 K/uL    Comment: Performed at [facility], [place] [address] [place], [place] [address]  Comprehensive metabolic panel     Status: Abnormal   Collection Time: 02/17/18  6:22 AM  Result Value Ref Range   Sodium 138 135 - 145 mmol/L   Potassium 3.7 3.5 - 5.1 mmol/L   Chloride 106 101 - 111 mmol/L   CO2 21 (L) 22 - 32 mmol/L   Glucose, Bld 129 (H) 65 - 99 mg/dL   BUN 12 6 - 20 mg/dL   Creatinine, Ser 0.85 0.44 - 1.00 mg/dL   Calcium  9.4 8.9 - 10.3 mg/dL   Total Protein 7.6 6.5 - 8.1 g/dL   Albumin 4.2 3.5 - 5.0 g/dL   AST 29 15 - 41 U/L   ALT 38 14 - 54 U/L   Alkaline Phosphatase 64 38 - 126 U/L   Total Bilirubin 1.0 0.3 - 1.2 mg/dL   GFR calc non Af Amer >60 >60 mL/min   GFR calc Af Amer >60 >60 mL/min    Comment: (NOTE) The eGFR has been calculated using the CKD EPI equation. This calculation has not been validated in all clinical situations. eGFR's persistently <60 mL/min signify  possible Chronic Kidney Disease.    Anion gap 11 5 - 15    Comment: Performed at [facility], [place] [address], [place], [place] [address]    Blood Alcohol level:  Lab Results  Component Value Date   ETH <10 11/04/2017   ETH <10 03/50/0938    Metabolic Disorder Labs: Lab Results  Component Value Date   HGBA1C 4.9 11/05/2017   MPG 93.93 11/05/2017   No results found for: PROLACTIN Lab Results  Component Value Date   CHOL 229 (H) 11/05/2017   TRIG 196 (H) 11/05/2017   HDL 45 11/05/2017   CHOLHDL 5.1 11/05/2017   VLDL 39 11/05/2017   LDLCALC 145 (H) 11/05/2017   LDLCALC 87 10/28/2014    Physical Findings: AIMS: Facial and Oral Movements Muscles of Facial Expression: None, normal Lips and Perioral Area: None, normal Jaw: None, normal Tongue: None, normal,Extremity Movements Upper (arms, wrists, hands, fingers): None, normal Lower (legs, knees, ankles, toes): None, normal, Trunk Movements Neck, shoulders, hips: None, normal, Overall Severity Severity of abnormal movements (highest score from questions above): None, normal Incapacitation due to abnormal movements: None, normal Patient's awareness of abnormal movements (rate only patient's report): No Awareness, Dental Status Current problems with teeth and/or dentures?: No Does patient usually wear dentures?: No  CIWA:    COWS:     Musculoskeletal: Strength & Muscle Tone: within normal limits Gait & Station: normal Patient leans: N/A  Psychiatric Specialty Exam: Physical Exam  ROS denies chest pain, no shortness of breath, no vomiting , no fever  Blood pressure 114/78, pulse (!) 109, temperature 99.9 F (37.7 C), temperature source Oral, resp. rate 20, height '5\' 4"'$  (1.626 m), weight 106.1 kg (234 lb), SpO2 100 %.Body mass index is 40.17 kg/m.  General Appearance: Well Groomed  Eye Contact:  Good  Speech:  Normal Rate  Volume:  Normal  Mood:  reports she is feeling better today  Affect:   more reactive , improved , does not present irritable or angry today  Thought Process:  Linear and Descriptions of Associations: Intact  Orientation:  Other:  fully alert and attentive  Thought Content:  denies hallucinations,no delusions, not internally preoccupied  Suicidal Thoughts:  No today denies suicidal or self injurious ideations, denies any homicidal or violent ideations  Homicidal Thoughts:  No  Memory:  recent and remote grossly intact   Judgement:  Other:  improving   Insight:  Fair- improving   Psychomotor Activity:  Normal  Concentration:  Concentration: Good and Attention Span: Good  Recall:  Good  Fund of Knowledge:  Good  Language:  Good  Akathisia:  Negative  Handed:  Right  AIMS (if indicated):     Assets:  Communication Skills Resilience  ADL's:  Intact  Cognition:  WNL  Sleep:  Number of Hours: 6   Assessment - patient reports feeling better than  she did prior to admission, and endorses improved mood, decreased irritability. Denies suicidal or homicidal ideations today. At present tolerating medications ( Abilify, Effexor XR ) well . Behavior on unit in good control.   Treatment Plan Summary: Daily contact with patient to assess and evaluate symptoms and progress in treatment, Medication management, Plan inpatient treatment  and medications as below Encourage group and milieu participation to work on coping skills and symptom reduction Continue Abilify 10 mgrs QDAY for mood disorder  Continue Effexor XR 225 mgrs QDAY for depression, anxiety Continue Neurontin 300 mgrs TID for anxiety , pain Treatment Team working on disposition planning options  Jenne Campus, MD 02/17/2018, 3:51 PM

## 2018-02-17 NOTE — [facility] Group Notes (Signed)
Adult Psychoeducational Group Note  Date:  02/17/2018 Time:  11:11 PM  Group Topic/Focus:  Wrap-Up Group:   The focus of this group is to help patients review their daily goal of treatment and discuss progress on daily workbooks.  Participation Level:  Active  Participation Quality:  Appropriate  Affect:  Appropriate  Cognitive:  Alert  Insight: Good  Engagement in Group:  Engaged  Modes of Intervention:  Discussion and Support  Additional Comments:  Pt was active in group. Pt said a positive thing about today was that she learned planning was helpful for her. Pt goal for tomorrow is to prepare for discharge.  Inetta Fermo 02/17/2018, 11:11 PM

## 2018-02-17 NOTE — Progress Notes (Signed)
Recreation Therapy Notes  Date: 6.14.19 Time: 0930 Location: [address] Dayroom  Group Topic: Stress Management  Goal Area(s) Addresses:  Patient will verbalize importance of using healthy stress management.  Patient will identify positive emotions associated with healthy stress management.   Intervention: Stress Management  Activity :  Progressive Muscle Relaxation.  LRT introduced the stress management technique of progressive muscle relaxation.  LRT read a script to guide patients in doing the activity.  Patients were to follow along as script was read to engage in activity.  Education:  Stress Management, Discharge Planning.   Education Outcome: Acknowledges edcuation/In group clarification offered/Needs additional education  Clinical Observations/Feedback: Pt did not attend group.      Victorino Sparrow, LRT/CTRS         Ria Comment, Danyetta Gillham A 02/17/2018 11:22 AM

## 2018-02-17 NOTE — Progress Notes (Signed)
Writer spoke with patient 1:1 and she reports that her day has gotten better than earlier today.  She reports writing letters and then tearing them up to help get her feeling of frustration down. She reports feeling sad and downing herself for believing in her husband only to find out that he was still cheating. She reports that she is done and wants a divorce but is worried if she will be able to see or have her kids and husband threatened to cut her off financially. Writer encouraged her to focus on what she needs to work on to better herself. She reports that her husband feels that she is acting and does not believe in mental illness. Support given and safety maintained on unit with 15 min checks.

## 2018-02-17 NOTE — BHH Group Notes (Signed)
LCSW Group Therapy Note 02/17/2018 12:58 PM  Type of Therapy/Topic: Group Therapy: Emotion Regulation  Participation Level: Active   Description of Group:  The purpose of this group is to assist patients in learning to regulate negative emotions and experience positive emotions. Patients will be guided to discuss ways in which they have been vulnerable to their negative emotions. These vulnerabilities will be juxtaposed with experiences of positive emotions or situations, and patients will be challenged to use positive emotions to combat negative ones. Special emphasis will be placed on coping with negative emotions in conflict situations, and patients will process healthy conflict resolution skills.  Therapeutic Goals: 1. Patient will identify two positive emotions or experiences to reflect on in order to balance out negative emotions 2. Patient will label two or more emotions that they find the most difficult to experience 3. Patient will demonstrate positive conflict resolution skills through discussion and/or role plays  Summary of Patient Progress:  Gina James was engaged and participated throughout the group session. Gina James reports that she struggles with her anger in addition to sadness. Gina James states that that she plans to form a new plan for discharge so that she is not triggered by her husband.    Therapeutic Modalities:  Cognitive Behavioral Therapy Feelings Identification Dialectical Behavioral Therapy   Theresa Duty Clinical Social Worker

## 2018-02-17 NOTE — Plan of Care (Signed)
Patient self inventory- Patient slept well last night, did not request sleep medications. Appetite has been good, energy level normal, and concentration good. Depression is rated 1 out of 10, hopelessness 0 out of 10, and anxiety 2 out of 10, with 10 being the worst. Denies withdrawal symptoms. Endorses physical pain rated 5 out of 10 in the neck and shoulders. Patient requested pain medication (Robaxin) and it has been helpful. Patient's goal today is to "write a letter to let go of anger and write a letter to [place]. Also to call downstairs to get one on one peer support.  Patient compliant with medication administration. Safety maintained with 15 minute safety checks. Will continue to monitor.  Problem: Activity: Goal: Sleeping patterns will improve Outcome: Progressing   Problem: Health Behavior/Discharge Planning: Goal: Compliance with treatment plan for underlying cause of condition will improve Outcome: Progressing

## 2018-02-18 LAB — GC/CHLAMYDIA PROBE AMP (~~LOC~~) NOT AT ARMC
CHLAMYDIA, DNA PROBE: NEGATIVE
Neisseria Gonorrhea: NEGATIVE

## 2018-02-18 NOTE — BHH Group Notes (Signed)
Life SKills   Date:  02/18/2018  Time:  4:19 PM  Type of Therapy:  Nurse Education  Identifying Needs : The group focuses on teaching patients hwo to identify their needs and then how to develop the skills needed to get some of those needs met.  Participation Level:  Active  Participation Quality:  Attentive  Affect:  Appropriate  Cognitive:  Alert  Insight:  Improving  Engagement in Group:  Engaged  Modes of Intervention:  Education  Summary of Progress/Problems:  Lauralyn Primes 02/18/2018, 4:19 PM

## 2018-02-18 NOTE — Progress Notes (Signed)
D Writer spoke to pt at med pass this am and pt is bright, makes direct eye contact and explains about her condition DJD, saying she's getting ready to have " major" back surgery where " they will insert a disc where I have none". She requested and was given a prn dose of po robaxin this morning and says she takes this everyday " just to get by".     A She completed her daily assessment and on this she wrote she deneid SI today and she rated her depression, hopelessness and anxeity " 1/0/2", respectively and she states her goal to day is to continue to " work on learning more coping skills".     R Safety is in place and therpaeutic relationship is fostered.

## 2018-02-18 NOTE — BHH Group Notes (Signed)
LCSW Group Therapy Note  02/18/2018   2:30-3:15  Type of Therapy and Topic:  Group Therapy: Anger Cues and Responses  Participation Level: Active   Description of Group:   In this group, patients learned how to recognize the physical, cognitive, emotional, and behavioral responses they have to anger-provoking situations.  They identified a recent time they became angry and how they reacted.  They analyzed how their reaction was possibly beneficial and how it was possibly unhelpful.  The group discussed a variety of healthier coping skills that could help with such a situation in the future.  Deep breathing was practiced briefly.  Therapeutic Goals: 1. Patients will remember their last incident of anger and how they felt emotionally and physically, what their thoughts were at the time, and how they behaved. 2. Patients will identify how their behavior at that time worked for them, as well as how it worked against them. 3. Patients will explore possible new behaviors to use in future anger situations. 4. Patients will learn that anger itself is normal and cannot be eliminated, and that healthier reactions can assist with resolving conflict rather than worsening situations.  Summary of Patient Progress:  The patient shared that her most recent time of anger was when she became aware of infidelity in her marriage and said her reaction was explosive and filled with emotions. She was able to recall  The  physical and emotional cues she experienced and is open to honing anger management skills so that she can used them in future situations. She recognizes that anger is a natural human emotion and that she can express it in more positive manners.  Therapeutic Modalities:   Cognitive Behavioral Therapy  Elisabeth Pigeon, LCSW  Rolanda Jay

## 2018-02-18 NOTE — Progress Notes (Signed)
[facility] MD Progress Note  02/18/2018 2:34 PM Gina James  MRN:  [id number] Subjective: Patient states that she continues to feel significantly better than she did prior to admission.  She states this is partially due to medications, which appear to be working well for her, and also to being removed from stressful marital situation. Currently denies medication side effects. States "I feel a lot better". Objective: I have reviewed the chart notes and have met with patient. 37 year old married female, two children, presented due to worsening mood ( including depression but also anger and irritability) suicidal ideations in the context of significant marital stressors. History of a prior admission in March 2019. Patient presents with improved mood, fuller range of affect, and states she is not currently feeling irritable or angry.  She has had no angry or explosive outbursts on unit thus far. Patient has been visible on unit, noted to be interacting appropriately with peers. Currently denies medication side effects. Denies suicidal ideations.   Principal Problem: Bipolar disorder, unspecified ([place]) Diagnosis:   Patient Active Problem List   Diagnosis Date Noted  . Suicidal ideation [R45.851] 02/16/2018  . MDD (major depressive disorder), recurrent severe, without psychosis ([place]) [F33.2] 02/15/2018  . Bipolar disorder, unspecified ([place]) [F31.9] 11/04/2017  . Cervical disc disorder with radiculopathy of cervical region [M50.10] 10/21/2017  . S/P spinal surgery [Z98.890] 08/12/2017  . DDD (degenerative disc disease), lumbar [M51.36] 01/13/2017  . Depression with anxiety [F41.8] 08/04/2016  . 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 PMDD (premenstrual dysphoric disorder) [F32.81] 07/29/2015  . Preventative health care [Z00.00] 10/29/2014  . Obesity [E66.9] 06/10/2014  . 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 Lumbar pain with radiation down both legs [M54.5]  09/11/2013  . Generalized anxiety disorder [F41.1] 06/18/2013   Total Time spent with patient: 15 minutes  Past Psychiatric History:  Past Medical History:  Past Medical History:  Diagnosis Date  . Ankle fracture, right   . Anxiety   . Arthritis    "in my back" (04/08/2015)  . Chronic lower back pain   . Complication of anesthesia    difficulty urinating after anesthesia  . Headache    "monthly" (04/08/2015)  . Post partum depression     Past Surgical History:  Procedure Laterality Date  . ANTERIOR FUSION LUMBAR SPINE  07/06/2017  . BREAST SURGERY    . CESAREAN SECTION  2007 and 2009  . COMBINED ABDOMINOPLASTY AND LIPOSUCTION  12/2014   "liposuction in thighs"  . FRACTURE SURGERY    . LAPAROSCOPIC ABDOMINAL EXPLORATION  1997   "to check for endometriosis"  . LAPAROSCOPIC CHOLECYSTECTOMY  2007  . OPEN REDUCTION INTERNAL FIXATION (ORIF) TIBIA/FIBULA FRACTURE Right 04/08/2015   Procedure: OPEN REDUCTION INTERNAL FIXATION (ORIF)  RIGHT TIBIA PILON FRACTURE;  Surgeon: Wylene Simmer, MD;  Location: [facility];  Service: Orthopedics;  Laterality: Right;  . TIBIA FRACTURE SURGERY Right 04/08/2015   ORIF tibial pilon   Family History:  Family History  Problem Relation Age of Onset  . Diabetes Father   . Cancer Sister 30       breast, had chemo/double mastectomy  . Heart disease Maternal Grandfather   . Heart disease Paternal Grandfather    Family Psychiatric  History:  Social History:  Social History   Substance and Sexual Activity  Alcohol Use Not Currently  . Alcohol/week: 2.4 oz  . Types: 4 Cans of beer per week     Social History  Substance and Sexual Activity  Drug Use Yes  . Types: Marijuana    Social History   Socioeconomic History  . Marital status: Married    Spouse name: Not on file  . Number of children: Not on file  . Years of education: Not on file  . Highest education level: Not on file  Occupational History  . Not on file  Social Needs  . Financial  resource strain: Not on file  . Food insecurity:    Worry: Not on file    Inability: Not on file  . Transportation needs:    Medical: Not on file    Non-medical: Not on file  Tobacco Use  . Smoking status: Former Smoker    Packs/day: 0.12    Years: 10.00    Pack years: 1.20    Types: Cigarettes    Last attempt to quit: 09/06/2004    Years since quitting: 13.4  . Smokeless tobacco: Never Used  Substance and Sexual Activity  . Alcohol use: Not Currently    Alcohol/week: 2.4 oz    Types: 4 Cans of beer per week  . Drug use: Yes    Types: Marijuana  . Sexual activity: Not Currently    Comment: vasectomy in partner  Lifestyle  . Physical activity:    Days per week: Not on file    Minutes per session: Not on file  . Stress: Not on file  Relationships  . Social connections:    Talks on phone: Not on file    Gets together: Not on file    Attends religious service: Not on file    Active member of club or organization: Not on file    Attends meetings of clubs or organizations: Not on file    Relationship status: Not on file  Other Topics Concern  . 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       Additional Social History:    Pain Medications: See MAR Prescriptions: See MAR Over the Counter: See MAR History of alcohol / drug use?: No history of alcohol / drug abuse  Sleep: Good  Appetite:  Good  Current Medications: Current Facility-Administered Medications  Medication Dose Route Frequency Provider Last Rate Last Dose  . ARIPiprazole (ABILIFY) tablet 10 mg  10 mg Oral Daily Dhillon [place], Myer Peer, MD   10 mg at 02/17/18 1812  . gabapentin (NEURONTIN) capsule 300 mg  300 mg Oral TID Rankin, Shuvon B, NP   300 mg at 02/18/18 1310  . levonorgestrel-ethinyl estradiol (SEASONALE,INTROVALE,JOLESSA)  0.15-0.03 MG per tablet 1 tablet  1 tablet Oral QHS Rankin, Shuvon B, NP   1 tablet at 02/17/18 2208  . loperamide (IMODIUM) capsule 2 mg  2 mg Oral PRN Mordecai Maes, NP   2 mg at 02/16/18 1452  . methocarbamol (ROBAXIN) tablet 750 mg  750 mg Oral BID PRN Rankin, Shuvon B, NP   750 mg at 02/18/18 0810  . venlafaxine XR (EFFEXOR-XR) 24 hr capsule 225 mg  225 mg Oral Q breakfast Rankin, Shuvon B, NP   225 mg at 02/17/18 2207    Lab Results:  Results for orders placed or performed during the hospital encounter of 02/15/18 (from the past 48 hour(s))  Pregnancy, urine     Status: None  Collection Time: 02/17/18  6:13 AM  Result Value Ref Range   Preg Test, Ur NEGATIVE NEGATIVE    Comment:        THE SENSITIVITY OF THIS METHODOLOGY IS >20 mIU/mL. Performed at [facility], [place] [address] [place], [place] [address]   Urine rapid drug screen (hosp performed)     Status: Abnormal   Collection Time: 02/17/18  6:16 AM  Result Value Ref Range   Opiates NONE DETECTED NONE DETECTED   Cocaine NONE DETECTED NONE DETECTED   Benzodiazepines POSITIVE (A) NONE DETECTED   Amphetamines NONE DETECTED NONE DETECTED   Tetrahydrocannabinol POSITIVE (A) NONE DETECTED   Barbiturates (A) NONE DETECTED    Result not available. Reagent lot number recalled by manufacturer.    Comment: Performed at [facility], [place] [address], [place], [place] [address]  Urinalysis, Complete w Microscopic     Status: Abnormal   Collection Time: 02/17/18  6:16 AM  Result Value Ref Range   Color, Urine YELLOW YELLOW   APPearance CLEAR CLEAR   Specific Gravity, Urine 1.019 1.005 - 1.030   pH 5.0 5.0 - 8.0   Glucose, UA NEGATIVE NEGATIVE mg/dL   Hgb urine dipstick MODERATE (A) NEGATIVE   Bilirubin Urine NEGATIVE NEGATIVE   Ketones, ur NEGATIVE NEGATIVE mg/dL   Protein, ur NEGATIVE NEGATIVE mg/dL   Nitrite NEGATIVE NEGATIVE   Leukocytes, UA NEGATIVE NEGATIVE   RBC / HPF 0-5 0 - 5  RBC/hpf   WBC, UA 6-10 0 - 5 WBC/hpf   Bacteria, UA MANY (A) NONE SEEN   Squamous Epithelial / LPF 0-5 0 - 5   Mucus PRESENT     Comment: Performed at [facility], [place] [address], [place], [place] [address]  CBC with Differential/Platelet     Status: Abnormal   Collection Time: 02/17/18  6:22 AM  Result Value Ref Range   WBC 8.3 4.0 - 10.5 K/uL   RBC 5.15 (H) 3.87 - 5.11 MIL/uL   Hemoglobin 15.0 12.0 - 15.0 g/dL   HCT 44.3 36.0 - 46.0 %   MCV 86.0 78.0 - 100.0 fL   MCH 29.1 26.0 - 34.0 pg   MCHC 33.9 30.0 - 36.0 g/dL   RDW 12.9 11.5 - 15.5 %   Platelets 270 150 - 400 K/uL   Neutrophils Relative % 48 %   Neutro Abs 3.9 1.7 - 7.7 K/uL   Lymphocytes Relative 37 %   Lymphs Abs 3.1 0.7 - 4.0 K/uL   Monocytes Relative 9 %   Monocytes Absolute 0.8 0.1 - 1.0 K/uL   Eosinophils Relative 6 %   Eosinophils Absolute 0.5 0.0 - 0.7 K/uL   Basophils Relative 0 %   Basophils Absolute 0.0 0.0 - 0.1 K/uL    Comment: Performed at [facility], [place] [address] [place], [place] [address]  Comprehensive metabolic panel     Status: Abnormal   Collection Time: 02/17/18  6:22 AM  Result Value Ref Range   Sodium 138 135 - 145 mmol/L   Potassium 3.7 3.5 - 5.1 mmol/L   Chloride 106 101 - 111 mmol/L   CO2 21 (L) 22 - 32 mmol/L   Glucose, Bld 129 (H) 65 - 99 mg/dL   BUN 12 6 - 20 mg/dL   Creatinine, Ser 0.85 0.44 - 1.00 mg/dL   Calcium 9.4 8.9 - 10.3 mg/dL   Total Protein 7.6 6.5 - 8.1 g/dL   Albumin 4.2 3.5 - 5.0  g/dL   AST 29 15 - 41 U/L   ALT 38 14 - 54 U/L   Alkaline Phosphatase 64 38 - 126 U/L   Total Bilirubin 1.0 0.3 - 1.2 mg/dL   GFR calc non Af Amer >60 >60 mL/min   GFR calc Af Amer >60 >60 mL/min    Comment: (NOTE) The eGFR has been calculated using the CKD EPI equation. This calculation has not been validated in all clinical situations. eGFR's persistently <60 mL/min signify possible Chronic Kidney Disease.    Anion gap 11 5 - 15    Comment:  Performed at [facility], [place] [address], [place], [place] [address]    Blood Alcohol level:  Lab Results  Component Value Date   ETH <10 11/04/2017   ETH <10 45/11/8880    Metabolic Disorder Labs: Lab Results  Component Value Date   HGBA1C 4.9 11/05/2017   MPG 93.93 11/05/2017   No results found for: PROLACTIN Lab Results  Component Value Date   CHOL 229 (H) 11/05/2017   TRIG 196 (H) 11/05/2017   HDL 45 11/05/2017   CHOLHDL 5.1 11/05/2017   VLDL 39 11/05/2017   LDLCALC 145 (H) 11/05/2017   LDLCALC 87 10/28/2014    Physical Findings: AIMS: Facial and Oral Movements Muscles of Facial Expression: None, normal Lips and Perioral Area: None, normal Jaw: None, normal Tongue: None, normal,Extremity Movements Upper (arms, wrists, hands, fingers): None, normal Lower (legs, knees, ankles, toes): None, normal, Trunk Movements Neck, shoulders, hips: None, normal, Overall Severity Severity of abnormal movements (highest score from questions above): None, normal Incapacitation due to abnormal movements: None, normal Patient's awareness of abnormal movements (rate only patient's report): No Awareness, Dental Status Current problems with teeth and/or dentures?: No Does patient usually wear dentures?: No  CIWA:    COWS:     Musculoskeletal: Strength & Muscle Tone: within normal limits Gait & Station: normal Patient leans: N/A  Psychiatric Specialty Exam: Physical Exam  ROS denies chest pain, no shortness of breath, no vomiting , no fever  Blood pressure (!) 141/98, pulse (!) 101, temperature 98.9 F (37.2 C), temperature source Oral, resp. rate 20, height 5' 4"  (1.626 m), weight 106.1 kg (234 lb), SpO2 100 %.Body mass index is 40.17 kg/m.  General Appearance: Well Groomed  Eye Contact:  Good  Speech:  Normal Rate  Volume:  Normal  Mood:  Reports she is feeling well and presents euthymic today  Affect:  Appropriate, reactive , not agitated or angry at  this time  Thought Process:  Linear and Descriptions of Associations: Intact  Orientation:  Other:  fully alert and attentive  Thought Content:  denies hallucinations,no delusions, not internally preoccupied  Suicidal Thoughts:  No today denies suicidal or self injurious ideations, denies any homicidal or violent ideations.  Has also specifically denied any homicidal ideations towards her husband  Homicidal Thoughts:  No  Memory:  recent and remote grossly intact   Judgement:  Other:  improving   Insight: improving   Psychomotor Activity:  Normal  Concentration:  Concentration: Good and Attention Span: Good  Recall:  Good  Fund of Knowledge:  Good  Language:  Good  Akathisia:  Negative  Handed:  Right  AIMS (if indicated):     Assets:  Communication Skills Resilience  ADL's:  Intact  Cognition:  WNL  Sleep:  Number of Hours: 6   Assessment -patient is presenting with improvement compared to admission.  Today he reports feeling differently better and  presents euthymic with a full range of affect.  She has reported a history of angry, rageful outbursts but has not had any since admission and her affect is currently bright and pleasant.  Currently denies suicidal ideations.  She is tolerating medications well.  Treatment Plan Summary: Daily contact with patient to assess and evaluate symptoms and progress in treatment, Medication management, Plan inpatient treatment  and medications as below Encourage group and milieu participation to work on coping skills and symptom reduction Continue Abilify 10 mgrs QDAY for mood disorder  Continue Effexor XR 225 mgrs QDAY for depression, anxiety Continue Neurontin 300 mgrs TID for anxiety , pain Treatment Team working on disposition planning options  Jenne Campus, MD 02/18/2018, 2:34 PM   Patient ID: Gina James, female   DOB: [birth year], 37 y.o.   MRN: [id number]

## 2018-02-18 NOTE — Plan of Care (Signed)
  Problem: Education: Goal: Mental status will improve Outcome: Progressing   

## 2018-02-18 NOTE — Progress Notes (Signed)
The focus of this group is to help patients review their daily goal of treatment and discuss progress on daily workbooks. Pt attended the evening group session and responded to all discussion prompts from the [place]. Pt shared that today was a good day on the unit, the highlight of which was being more generally positive in her thinking.  Pt told the group how she's working to stay positive in all situations and how she believes this will help her stay well upon discharge. "I'm finding that most of my negativity has been false and not helping me at all. If I can keep a positive outlook, things will get better." Kasie also mentioned having been inspired by the daytime groups to work on improving herself.  Pt reported having no additional needs from Nursing Staff this evening and her affect was appropriate.

## 2018-02-18 NOTE — Progress Notes (Signed)
Writer spoke with patient 1:1 and she reports having had a good day. She reports that she has been writing out things she needs to take care of once discharged. She reports that she has even plan B if her first plan does not work. She reports feeling much better and is hoping to get her life together without her husband. She plans to reach out to her parents for financial and emotional support. Support given and safety maintained on unit with 15 min checks.

## 2018-02-19 MED ORDER — ARIPIPRAZOLE 10 MG PO TABS
10.0000 mg | ORAL_TABLET | Freq: Every day | ORAL | Status: DC
  Administered 2018-02-19 – 2018-02-21 (×3): 10 mg via ORAL
  Filled 2018-02-19 (×4): qty 1

## 2018-02-19 NOTE — BHH Group Notes (Signed)
[facility] LCSW Group Therapy Note  Date/Time:  02/19/2018 9:00-10:00 or 10:00-11:00AM  Type of Therapy and Topic:  Group Therapy:  Healthy and Unhealthy Supports  Participation Level:  Active   Description of Group:  Patients in this group were introduced to the idea of adding a variety of healthy supports to address the various needs in their lives.Patients discussed what additional healthy supports could be helpful in their recovery and wellness after discharge in order to prevent future hospitalizations.   An emphasis was placed on using counselor, doctor, therapy groups, 12-step groups, and problem-specific support groups to expand supports.  They also worked as a group on developing a specific plan for several patients to deal with unhealthy supports through [place], psychoeducation with loved ones, and even termination of relationships.   Therapeutic Goals:   1)  discuss importance of adding supports to stay well once out of the hospital  2)  compare healthy versus unhealthy supports and identify some examples of each  3)  generate ideas and descriptions of healthy supports that can be added  4)  offer mutual support about how to address unhealthy supports  5)  encourage active participation in and adherence to discharge plan    Summary of Patient Progress:  The patient stated that current healthy supports in her life are her parentswhile current unhealthy supports include husband.  The patient expressed a willingness to add more as supports to help in her recovery journey. Patient contrasted their healthy support versus one's that were conducive to continued recovery. Patient was able to discern between the two types. Patient expressed willingness to incorporate additional supports while severing ties with unhealthy one. Patient is able to articulate the importance of using discharge planning as an opportunity to identify additional supports.   Therapeutic Modalities:    Motivational Interviewing Brief Solution-Focused Therapy  Elisabeth Pigeon, LCSW  Rolanda Jay

## 2018-02-19 NOTE — Progress Notes (Signed)
[facility] MD Progress Note  02/19/2018 12:58 PM CYNCERE RUHE  MRN:  [id number] Subjective: Patient reports she continues to feel better, and describes significant improvement compared to how she felt prior to admission .  She states she is not feeling irritable or angry.  Denies medication side effects.  Today is reporting some anxiety, which she attributes to planning on calling her parents later on today to "update them on how I am doing.  I am also going to ask them for money to hire a lawyer's".  She attributes recent symptoms and irritability in part to marital discord and states she is wanting separation. Today is tolerating medications well and denies side effects. Denies suicidal ideations. Objective: I have reviewed the chart notes and have met with patient. 37 year old married female, two children, presented due to worsening mood ( including depression but also anger and irritability) suicidal ideations in the context of significant marital stressors. History of a prior admission in March 2019. Patient continues to present with much improvement compared to her admission presentation.  At this time presents with a reactive/bright/not irritable or expansive affect, and describes her mood is improved.  She appears euthymic.  She is future oriented, currently denies suicidal ideations.  She also denies any violent or homicidal ideations towards her spouse. As above today expresses some apprehension regarding a planned phone contact with her parents.  States they are very supportive but is anxious about updating them regarding recent decompensation. Tolerating medications well and denies side effects.   She is visible on the unit and is noted to interact appropriately with peers.    Principal Problem: Bipolar disorder, unspecified ([place]) Diagnosis:   Patient Active Problem List   Diagnosis Date Noted  . Suicidal ideation [R45.851] 02/16/2018  . MDD (major depressive disorder), recurrent severe,  without psychosis ([place]) [F33.2] 02/15/2018  . Bipolar disorder, unspecified ([place]) [F31.9] 11/04/2017  . Cervical disc disorder with radiculopathy of cervical region [M50.10] 10/21/2017  . S/P spinal surgery [Z98.890] 08/12/2017  . DDD (degenerative disc disease), lumbar [M51.36] 01/13/2017  . Depression with anxiety [F41.8] 08/04/2016  . 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 PMDD (premenstrual dysphoric disorder) [F32.81] 07/29/2015  . Preventative health care [Z00.00] 10/29/2014  . Obesity [E66.9] 06/10/2014  . 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 Lumbar pain with radiation down both legs [M54.5] 09/11/2013  . Generalized anxiety disorder [F41.1] 06/18/2013   Total Time spent with patient: 15 minutes  Past Psychiatric History:  Past Medical History:  Past Medical History:  Diagnosis Date  . Ankle fracture, right   . Anxiety   . Arthritis    "in my back" (04/08/2015)  . Chronic lower back pain   . Complication of anesthesia    difficulty urinating after anesthesia  . Headache    "monthly" (04/08/2015)  . Post partum depression     Past Surgical History:  Procedure Laterality Date  . ANTERIOR FUSION LUMBAR SPINE  07/06/2017  . BREAST SURGERY    . CESAREAN SECTION  2007 and 2009  . COMBINED ABDOMINOPLASTY AND LIPOSUCTION  12/2014   "liposuction in thighs"  . FRACTURE SURGERY    . LAPAROSCOPIC ABDOMINAL EXPLORATION  1997   "to check for endometriosis"  . LAPAROSCOPIC CHOLECYSTECTOMY  2007  . OPEN REDUCTION INTERNAL FIXATION (ORIF) TIBIA/FIBULA FRACTURE Right 04/08/2015   Procedure: OPEN REDUCTION INTERNAL FIXATION (ORIF)  RIGHT TIBIA PILON FRACTURE;  Surgeon: Wylene Simmer, MD;  Location: [facility];  Service:  Orthopedics;  Laterality: Right;  . TIBIA FRACTURE SURGERY Right 04/08/2015   ORIF tibial pilon   Family History:  Family History  Problem Relation Age of Onset  . Diabetes Father   . Cancer Sister 30       breast, had chemo/double  mastectomy  . Heart disease Maternal Grandfather   . Heart disease Paternal Grandfather    Family Psychiatric  History:  Social History:  Social History   Substance and Sexual Activity  Alcohol Use Not Currently  . Alcohol/week: 2.4 oz  . Types: 4 Cans of beer per week     Social History   Substance and Sexual Activity  Drug Use Yes  . Types: Marijuana    Social History   Socioeconomic History  . Marital status: Married    Spouse name: Not on file  . Number of children: Not on file  . Years of education: Not on file  . Highest education level: Not on file  Occupational History  . Not on file  Social Needs  . Financial resource strain: Not on file  . Food insecurity:    Worry: Not on file    Inability: Not on file  . Transportation needs:    Medical: Not on file    Non-medical: Not on file  Tobacco Use  . Smoking status: Former Smoker    Packs/day: 0.12    Years: 10.00    Pack years: 1.20    Types: Cigarettes    Last attempt to quit: 09/06/2004    Years since quitting: 13.4  . Smokeless tobacco: Never Used  Substance and Sexual Activity  . Alcohol use: Not Currently    Alcohol/week: 2.4 oz    Types: 4 Cans of beer per week  . Drug use: Yes    Types: Marijuana  . Sexual activity: Not Currently    Comment: vasectomy in partner  Lifestyle  . Physical activity:    Days per week: Not on file    Minutes per session: Not on file  . Stress: Not on file  Relationships  . Social connections:    Talks on phone: Not on file    Gets together: Not on file    Attends religious service: Not on file    Active member of club or organization: Not on file    Attends meetings of clubs or organizations: Not on file    Relationship status: Not on file  Other Topics Concern  . 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       Additional Social History:    Pain Medications: See MAR Prescriptions: See MAR Over the Counter: See MAR History of alcohol / drug use?: No history of alcohol / drug abuse  Sleep: Good  Appetite:  Good  Current Medications: Current Facility-Administered Medications  Medication Dose Route Frequency Provider Last Rate Last Dose  . ARIPiprazole (ABILIFY) tablet 10 mg  10 mg Oral QHS Cobos, Fernando A, MD      . gabapentin (NEURONTIN) capsule 300 mg  300 mg Oral TID Rankin, Shuvon B, NP   300 mg at 02/19/18 1148  . levonorgestrel-ethinyl estradiol (SEASONALE,INTROVALE,JOLESSA) 0.15-0.03 MG per tablet 1 tablet  1 tablet Oral QHS Rankin, Shuvon B, NP   1 tablet at 02/18/18 2111  .  loperamide (IMODIUM) capsule 2 mg  2 mg Oral PRN Mordecai Maes, NP   2 mg at 02/16/18 1452  . methocarbamol (ROBAXIN) tablet 750 mg  750 mg Oral BID PRN Rankin, Shuvon B, NP   750 mg at 02/18/18 2111  . venlafaxine XR (EFFEXOR-XR) 24 hr capsule 225 mg  225 mg Oral Q breakfast Rankin, Shuvon B, NP   225 mg at 02/18/18 2111    Lab Results:  No results found for this or any previous visit (from the past 48 hour(s)).  Blood Alcohol level:  Lab Results  Component Value Date   ETH <10 11/04/2017   ETH <10 38/75/6433    Metabolic Disorder Labs: Lab Results  Component Value Date   HGBA1C 4.9 11/05/2017   MPG 93.93 11/05/2017   No results found for: PROLACTIN Lab Results  Component Value Date   CHOL 229 (H) 11/05/2017   TRIG 196 (H) 11/05/2017   HDL 45 11/05/2017   CHOLHDL 5.1 11/05/2017   VLDL 39 11/05/2017   LDLCALC 145 (H) 11/05/2017   LDLCALC 87 10/28/2014    Physical Findings: AIMS: Facial and Oral Movements Muscles of Facial Expression: None, normal Lips and Perioral Area: None, normal Jaw: None, normal Tongue: None, normal,Extremity Movements Upper (arms, wrists, hands, fingers): None, normal Lower (legs, knees, ankles, toes): None, normal, Trunk  Movements Neck, shoulders, hips: None, normal, Overall Severity Severity of abnormal movements (highest score from questions above): None, normal Incapacitation due to abnormal movements: None, normal Patient's awareness of abnormal movements (rate only patient's report): No Awareness, Dental Status Current problems with teeth and/or dentures?: No Does patient usually wear dentures?: No  CIWA:    COWS:     Musculoskeletal: Strength & Muscle Tone: within normal limits Gait & Station: normal Patient leans: N/A  Psychiatric Specialty Exam: Physical Exam  ROS denies chest pain, no shortness of breath, no vomiting , no fever  Blood pressure (!) 154/94, pulse (!) 109, temperature 98 F (36.7 C), resp. rate 16, height 5' 4"  (1.626 m), weight 106.1 kg (234 lb), SpO2 100 %.Body mass index is 40.17 kg/m.  General Appearance: Well Groomed  Eye Contact:  Good  Speech:  Normal Rate  Volume:  Normal  Mood:  Euthymic  Affect:  Appropriate, full in range, no irritability  Thought Process:  Linear and Descriptions of Associations: Intact  Orientation:  Other:  fully alert and attentive  Thought Content:  denies hallucinations,no delusions, not internally preoccupied  Suicidal Thoughts:  No today denies suicidal or self injurious ideations, denies any homicidal or violent ideations.  Has also specifically denied any homicidal ideations towards her husband  Homicidal Thoughts:  No  Memory:  recent and remote grossly intact   Judgement:  Other:  improving   Insight: improving   Psychomotor Activity:  Normal  Concentration:  Concentration: Good and Attention Span: Good  Recall:  Good  Fund of Knowledge:  Good  Language:  Good  Akathisia:  Negative  Handed:  Right  AIMS (if indicated):     Assets:  Communication Skills Resilience  ADL's:  Intact  Cognition:  WNL  Sleep:  Number of Hours: 6   Assessment -patient is improving and today presents euthymic and with a full range of affect .  She  has not been irritable or angry on unit, presents pleasant on approach, and has been interacting appropriately with peers.  She attributes anger and irritability  at least partially to marital discord.  Today somewhat anxious and ruminative about  an upcoming call to her parents, whom she describes as supportive.  Thus far is tolerating Abilify, Neurontin,  and Effexor XR well without side effects  Treatment Plan Summary: Daily contact with patient to assess and evaluate symptoms and progress in treatment, Medication management, Plan inpatient treatment  and medications as below  Treatment plan reviewed as below today June 16 Encourage group and milieu participation to work on Radiographer, therapeutic and symptom reduction Continue Abilify 10 mgrs QDAY for mood disorder  Continue Effexor XR 225 mgrs QDAY for depression, anxiety Continue Neurontin 300 mgrs TID for anxiety , pain Treatment Team working on disposition planning options  Jenne Campus, MD 02/19/2018, 12:58 PM   Patient ID: Lafe Garin, female   DOB: [birth year], 37 y.o.   MRN: [id number]

## 2018-02-19 NOTE — Plan of Care (Signed)
  Problem: Education: Goal: Emotional status will improve Outcome: Progressing   Problem: Coping: Goal: Ability to demonstrate self-control will improve Outcome: Progressing

## 2018-02-19 NOTE — Progress Notes (Signed)
Pt presents with an anxious mood. Pt rates on her self inventory sheet: depression 0/10, anxiety 3/10, and hopelessness 0/10. Pt denies SI/HI. Pt reports good sleep, concentration and appetite today. Pt expressed that she's able to manage her anxiety level with medication management and coping skills. Pt stated goal for today, "go to groups, call parents and ask for lawyer money". Pt reports tolerating her meds well and denies any side effects.  Orders reviewed with pt. Medications administered as ordered per MD. Verbal support provided. Pt encouraged to attend groups. Suicide risk assessment completed. 15 minute checks performed for safety. Self inventory sheet reviewed.  Pt compliant with tx plan.

## 2018-02-19 NOTE — Progress Notes (Signed)
Writer spoke with patient 1:1 and she reports her day being good even though she received some bad new when she spoke with her parents. She reports that her husband called and spoke to her parents and told them some things that she reports as not true. She reported that her father believes him and is not supporting her financially. She reported that of course it hurt and she cried but she is okay with his decision. She reported not losing it and acting like her old self and smiled. Writer praised her for how she handled the situation. Support given and safety maintained on unit with 15 min checks.

## 2018-02-20 NOTE — Plan of Care (Signed)
Nurse discussed anxiety, depression, coping skills with patient. 

## 2018-02-20 NOTE — BHH Group Notes (Signed)
[facility] Group Notes:  (Nursing/MHT/Case Management/Adjunct)  Date:  02/20/2018  Time:  4:00 pm  Type of Therapy:  Psychoeducational Skills  Participation Level:  Active  Participation Quality:  Appropriate  Affect:  Appropriate  Cognitive:  Appropriate  Insight:  Appropriate  Engagement in Group:  Engaged  Modes of Intervention:  Discussion  Summary of Progress/Problems:  Patient alert and engaged appropriately in group.   Cammy Copa 02/20/2018, 5:49 PM

## 2018-02-20 NOTE — Progress Notes (Deleted)
  [facility] Adult Case Management Discharge Plan :  Will you be returning to the same living situation after discharge:  Yes,  patient reports that she is returning to her famiily home at discharge At discharge, do you have transportation home?: Yes,  patient reports that her friend will pick her up at discharge Do you have the ability to pay for your medications: Yes,  BCBS  Release of information consent forms completed and in the chart;  Patient's signature needed at discharge.  Patient to Follow up at: Follow-up Information    Chucky May, MD. Go on 02/21/2018.   Specialty:  Psychiatry Why:  Appointment with Dr. Toy Care is Tuesday, 02/21/18 at 4:30pm. Please be sure to bring any discharge paperwork from the hospital and any medications list.  Contact information: [address] [place] [address] [phone]           Next level of care provider has access to [place] and Suicide Prevention discussed: Yes,  with the patient  Have you used any form of tobacco in the last 30 days? (Cigarettes, Smokeless Tobacco, Cigars, and/or Pipes): No  Has patient been referred to the Quitline?: N/A patient is not a smoker  Patient has been referred for addiction treatment: N/A  Marylee Floras, [place] 02/20/2018, 9:50 AM

## 2018-02-20 NOTE — Progress Notes (Signed)
Recreation Therapy Notes  Date: 6.17.19 Time: 0930 Location: [address] Dayroom  Group Topic: Stress Management  Goal Area(s) Addresses:  Patient will verbalize importance of using healthy stress management.  Patient will identify positive emotions associated with healthy stress management.   Behavioral Response: Engaged  Intervention: Stress Management  Activity :  Choice Meditation.  LRT played a meditation on choice.  Patients were to follow along as meditation was played to engage in activity.  Education:  Stress Management, Discharge Planning.   Education Outcome: Acknowledges edcuation/In group clarification offered/Needs additional education  Clinical Observations/Feedback: Pt attended and participated in group.    Victorino Sparrow, LRT/CTRS         Victorino Sparrow A 02/20/2018 11:46 AM

## 2018-02-20 NOTE — BHH Group Notes (Signed)
[place] LCSW Group Therapy Note  Date/Time: 02/20/18, 1315  Type of Therapy and Topic:  Group Therapy:  Overcoming Obstacles  Participation Level:  6256  Description of Group:    In this group patients will be encouraged to explore what they see as obstacles to their own wellness and recovery. They will be guided to discuss their thoughts, feelings, and behaviors related to these obstacles. The group will process together ways to cope with barriers, with attention given to specific choices patients can make. Each patient will be challenged to identify changes they are motivated to make in order to overcome their obstacles. This group will be process-oriented, with patients participating in exploration of their own experiences as well as giving and receiving support and challenge from other group members.  Therapeutic Goals: 1. Patient will identify personal and current obstacles as they relate to admission. 2. Patient will identify barriers that currently interfere with their wellness or overcoming obstacles.  3. Patient will identify feelings, thought process and behaviors related to these barriers. 4. Patient will identify two changes they are willing to make to overcome these obstacles:    Summary of Patient Progress: Pt shared that divorce, health, lack of recent work experience are current obstacles in her life.  Pt was active during group discussions regarding positive ways to overcome or make progress in getting past obstacles.       Therapeutic Modalities:   Cognitive Behavioral Therapy Solution Focused Therapy Motivational Interviewing Relapse Prevention Therapy  Lurline Idol, LCSW

## 2018-02-20 NOTE — BHH Group Notes (Signed)
Adult Psychoeducational Group Note  Date:  02/20/2018 Time:  9:36 PM  Group Topic/Focus:  Wrap-Up Group:   The focus of this group is to help patients review their daily goal of treatment and discuss progress on daily workbooks.  Participation Level:  Active  Participation Quality:  Appropriate  Affect:  Appropriate  Cognitive:  Alert  Insight: Good  Engagement in Group:  Engaged  Modes of Intervention:  Discussion and Support  Additional Comments:  Pt was active in group. PT goal for today was to work on new discharge plan. Pt said she met her goal. Pt goal for tomorrow is to discuss her new discharge plan with her treatment team.  Inetta Fermo 02/20/2018, 9:36 PM

## 2018-02-20 NOTE — Progress Notes (Signed)
D:  Patient's self inventory sheet, patient sleeps good, no sleep medication given.  Good appetite, normal energy level, good concentration.  Denied depression and hopeless, rated anxiety #3.  Denied withdrawals.  Denied SI.  Physical problems, pain, neck shoulder.  Pain medicine is helpful.  Goal is discharge.  Plans to fill out paperwork.   A:  Medications administered per MD orders.  Emotional support and encouragement given patient. R:  Denied SI and HI, contracts for safety.  Denied A/V hallucinations.  Safety maintained with 15 minute checks.

## 2018-02-20 NOTE — Progress Notes (Signed)
D:  Gina James has been up and visible on the unit.  She interacts well with staff and peers.  She attended evening group.  She denied SI/HI or A/V hallucinations.  She stated that her day was good overall.  She was upset earlier because she thought she was going home.  She reported using her coping skills to help deal and was able to see that having a safe discharge plan is imperative.  "I was able to talk with the nurse today and she helped me see that I need to own up to my behaviors.  She also told me to use the new coping skills to help not blow up about everything."   A:  1:1 with RN for support and encouragement.  Medications as ordered.  Q 15 minute checks maintained for safety.  Encouraged participation in group and unit activities.   R:  Takasha remains safe on the unit.  We will continue to monitor the progress towards her goals.

## 2018-02-20 NOTE — Progress Notes (Signed)
[facility] MD Progress Note  02/20/2018 2:18 PM Gina James  MRN:  [id number] Subjective: Patient reports she continues to feel better than she did prior to admission.  She does acknowledge feeling upset following a phone conversation with her father yesterday.  States that her husband apparently contacted her father and "lied about me", telling him she was "spending all the family's money on marijuana".  States that she felt that her father "believed him more than me". She denies suicidal ideations. She denies medication side effects.  Objective: I have reviewed the chart notes and have met with patient. 37 year old married female, two children, presented due to worsening mood ( including depression but also anger and irritability) suicidal ideations in the context of significant marital stressors. History of a prior admission in March 2019. She is presenting with noticeable improvement compared to admission, but today does present somewhat more labile and intermittently tearful due to above.  Mainly, continues to focus on marital discord, describes relationship with spouse as toxic.  States that they have been separated for months to years but continue to live together at this time.  Expresses feeling upset about her husband "lying to my dad about me", and about father believing him rather than her.  States she feels more supported by her mother. We have reviewed above issues and have discussed potential/likelihood for worsening symptoms upon returning home, as husband and her continue to live together.  She states she is hoping he will leave the home, and also states that she has a friend she may be able to stay with for period of time. She has been visible on unit and interactive with peers. Denies suicidal ideations. No medication side effects    Principal Problem: Bipolar disorder, unspecified ([place]) Diagnosis:   Patient Active Problem List   Diagnosis Date Noted  . Suicidal ideation [R45.851]  02/16/2018  . MDD (major depressive disorder), recurrent severe, without psychosis ([place]) [F33.2] 02/15/2018  . Bipolar disorder, unspecified ([place]) [F31.9] 11/04/2017  . Cervical disc disorder with radiculopathy of cervical region [M50.10] 10/21/2017  . S/P spinal surgery [Z98.890] 08/12/2017  . DDD (degenerative disc disease), lumbar [M51.36] 01/13/2017  . Depression with anxiety [F41.8] 08/04/2016  . 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 PMDD (premenstrual dysphoric disorder) [F32.81] 07/29/2015  . Preventative health care [Z00.00] 10/29/2014  . Obesity [E66.9] 06/10/2014  . 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 Lumbar pain with radiation down both legs [M54.5] 09/11/2013  . Generalized anxiety disorder [F41.1] 06/18/2013   Total Time spent with patient: 20 minutes  Past Psychiatric History:  Past Medical History:  Past Medical History:  Diagnosis Date  . Ankle fracture, right   . Anxiety   . Arthritis    "in my back" (04/08/2015)  . Chronic lower back pain   . Complication of anesthesia    difficulty urinating after anesthesia  . Headache    "monthly" (04/08/2015)  . Post partum depression     Past Surgical History:  Procedure Laterality Date  . ANTERIOR FUSION LUMBAR SPINE  07/06/2017  . BREAST SURGERY    . CESAREAN SECTION  2007 and 2009  . COMBINED ABDOMINOPLASTY AND LIPOSUCTION  12/2014   "liposuction in thighs"  . FRACTURE SURGERY    . LAPAROSCOPIC ABDOMINAL EXPLORATION  1997   "to check for endometriosis"  . LAPAROSCOPIC CHOLECYSTECTOMY  2007  . OPEN REDUCTION INTERNAL FIXATION (ORIF) TIBIA/FIBULA FRACTURE Right 04/08/2015   Procedure: OPEN REDUCTION INTERNAL FIXATION (  ORIF)  RIGHT TIBIA PILON FRACTURE;  Surgeon: Wylene Simmer, MD;  Location: [facility];  Service: Orthopedics;  Laterality: Right;  . TIBIA FRACTURE SURGERY Right 04/08/2015   ORIF tibial pilon   Family History:  Family History  Problem Relation Age of Onset  .  Diabetes Father   . Cancer Sister 30       breast, had chemo/double mastectomy  . Heart disease Maternal Grandfather   . Heart disease Paternal Grandfather    Family Psychiatric  History:  Social History:  Social History   Substance and Sexual Activity  Alcohol Use Not Currently  . Alcohol/week: 2.4 oz  . Types: 4 Cans of beer per week     Social History   Substance and Sexual Activity  Drug Use Yes  . Types: Marijuana    Social History   Socioeconomic History  . Marital status: Married    Spouse name: Not on file  . Number of children: Not on file  . Years of education: Not on file  . Highest education level: Not on file  Occupational History  . Not on file  Social Needs  . Financial resource strain: Not on file  . Food insecurity:    Worry: Not on file    Inability: Not on file  . Transportation needs:    Medical: Not on file    Non-medical: Not on file  Tobacco Use  . Smoking status: Former Smoker    Packs/day: 0.12    Years: 10.00    Pack years: 1.20    Types: Cigarettes    Last attempt to quit: 09/06/2004    Years since quitting: 13.4  . Smokeless tobacco: Never Used  Substance and Sexual Activity  . Alcohol use: Not Currently    Alcohol/week: 2.4 oz    Types: 4 Cans of beer per week  . Drug use: Yes    Types: Marijuana  . Sexual activity: Not Currently    Comment: vasectomy in partner  Lifestyle  . Physical activity:    Days per week: Not on file    Minutes per session: Not on file  . Stress: Not on file  Relationships  . Social connections:    Talks on phone: Not on file    Gets together: Not on file    Attends religious service: Not on file    Active member of club or organization: Not on file    Attends meetings of clubs or organizations: Not on file    Relationship status: Not on file  Other Topics Concern  . 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       Additional Social History:    Pain Medications: See MAR Prescriptions: See MAR Over the Counter: See MAR History of alcohol / drug use?: No history of alcohol / drug abuse  Sleep: Good  Appetite:  Good  Current Medications: Current Facility-Administered Medications  Medication Dose Route Frequency Provider Last Rate Last Dose  . ARIPiprazole (ABILIFY) tablet 10 mg  10 mg Oral QHS Rodolphe Edmonston, Myer Peer, MD   10 mg at 02/19/18 2141  . gabapentin (NEURONTIN) capsule 300 mg  300 mg Oral TID Rankin, Shuvon B, NP   300 mg at 02/20/18 1201  . levonorgestrel-ethinyl estradiol (SEASONALE,INTROVALE,JOLESSA) 0.15-0.03 MG per  tablet 1 tablet  1 tablet Oral QHS Rankin, Shuvon B, NP   1 tablet at 02/19/18 2141  . loperamide (IMODIUM) capsule 2 mg  2 mg Oral PRN Mordecai Maes, NP   2 mg at 02/16/18 1452  . methocarbamol (ROBAXIN) tablet 750 mg  750 mg Oral BID PRN Rankin, Shuvon B, NP   750 mg at 02/20/18 0740  . venlafaxine XR (EFFEXOR-XR) 24 hr capsule 225 mg  225 mg Oral Q breakfast Rankin, Shuvon B, NP   225 mg at 02/19/18 2140    Lab Results:  No results found for this or any previous visit (from the past 48 hour(s)).  Blood Alcohol level:  Lab Results  Component Value Date   ETH <10 11/04/2017   ETH <10 16/09/930    Metabolic Disorder Labs: Lab Results  Component Value Date   HGBA1C 4.9 11/05/2017   MPG 93.93 11/05/2017   No results found for: PROLACTIN Lab Results  Component Value Date   CHOL 229 (H) 11/05/2017   TRIG 196 (H) 11/05/2017   HDL 45 11/05/2017   CHOLHDL 5.1 11/05/2017   VLDL 39 11/05/2017   LDLCALC 145 (H) 11/05/2017   LDLCALC 87 10/28/2014    Physical Findings: AIMS: Facial and Oral Movements Muscles of Facial Expression: None, normal Lips and Perioral Area: None, normal Jaw: None, normal Tongue: None, normal,Extremity Movements Upper (arms, wrists,  hands, fingers): None, normal Lower (legs, knees, ankles, toes): None, normal, Trunk Movements Neck, shoulders, hips: None, normal, Overall Severity Severity of abnormal movements (highest score from questions above): None, normal Incapacitation due to abnormal movements: None, normal Patient's awareness of abnormal movements (rate only patient's report): No Awareness, Dental Status Current problems with teeth and/or dentures?: No Does patient usually wear dentures?: No  CIWA:  CIWA-Ar Total: 1 COWS:  COWS Total Score: 2  Musculoskeletal: Strength & Muscle Tone: within normal limits Gait & Station: normal Patient leans: N/A  Psychiatric Specialty Exam: Physical Exam  ROS denies chest pain, no shortness of breath, no vomiting , no fever  Blood pressure 126/79, pulse 91, temperature 98.4 F (36.9 C), temperature source Oral, resp. rate 16, height 5' 4"  (1.626 m), weight 106.1 kg (234 lb), SpO2 100 %.Body mass index is 40.17 kg/m.  General Appearance: Well Groomed  Eye Contact:  Good  Speech:  Normal Rate  Volume:  Normal  Mood:  Improved compared to admission but remains vaguely depressed and labile today  Affect:  Appropriate and Congruent  Thought Process:  Linear and Descriptions of Associations: Intact  Orientation:  Other:  fully alert and attentive  Thought Content:  denies hallucinations,no delusions, not internally preoccupied  Suicidal Thoughts:  No today denies suicidal or self injurious ideations, denies any homicidal or violent ideations.  Has also specifically denied any homicidal ideations towards her husband  Homicidal Thoughts:  No  Memory:  recent and remote grossly intact   Judgement:  Other:  improving   Insight: improving   Psychomotor Activity:  Normal  Concentration:  Concentration: Good and Attention Span: Good  Recall:  Good  Fund of Knowledge:  Good  Language:  Good  Akathisia:  Negative  Handed:  Right  AIMS (if indicated):     Assets:   Communication Skills Resilience  ADL's:  Intact  Cognition:  WNL  Sleep:  Number of Hours: 6.75   Assessment -patient has improved significantly compared to admission and has presented with a much improved mood and no irritable or angry outbursts.  She has  tolerated medications well.  She has described marital tension as a major and ongoing stressor.  Today presents more ruminative and more labile, following a phone conversation with her father.  States that husband lied to father about her behaviors and that father seems to believe her husband rather than her. Tearful during today's interaction when discussing the above, but continues to deny any suicidal ideations or any homicidal or violent ideations towards him or anyone else. Tolerating medications well.  Treatment Plan Summary: Daily contact with patient to assess and evaluate symptoms and progress in treatment, Medication management, Plan inpatient treatment  and medications as below  Treatment plan reviewed as below today June 17 Encourage group and milieu participation to work on Radiographer, therapeutic and symptom reduction Continue Abilify 10 mgrs QDAY for mood disorder  Continue Effexor XR 225 mgrs QDAY for depression, anxiety Continue Neurontin 300 mgrs TID for anxiety , pain Treatment Team working on disposition planning options -patient encouraged to consider alternate disposition plans if she feels that returning home will likely result in increased tension and deteriorating mood. Jenne Campus, MD 02/20/2018, 2:18 PM   Patient ID: Lafe Garin, female   DOB: June 06, 1981, 37 y.o.   MRN: [id number]

## 2018-02-21 DIAGNOSIS — F1290 Cannabis use, unspecified, uncomplicated: Secondary | ICD-10-CM

## 2018-02-21 DIAGNOSIS — R45851 Suicidal ideations: Secondary | ICD-10-CM

## 2018-02-21 DIAGNOSIS — F332 Major depressive disorder, recurrent severe without psychotic features: Principal | ICD-10-CM

## 2018-02-21 DIAGNOSIS — Z79899 Other long term (current) drug therapy: Secondary | ICD-10-CM

## 2018-02-21 DIAGNOSIS — Z87891 Personal history of nicotine dependence: Secondary | ICD-10-CM

## 2018-02-21 DIAGNOSIS — F3163 Bipolar disorder, current episode mixed, severe, without psychotic features: Secondary | ICD-10-CM

## 2018-02-21 NOTE — Progress Notes (Signed)
CSW contacted Jan Fogarty, friend [phone]) to discuss the patient's discharge plans.   Per Jan,  the patient is not returning to the same living situation and that she and the patient have agreed for her to discharge home with Jan. Jan reports that she she does not have any concerns for safety at discharge. Jan states that she also will pick the patient up at discharge.  There was no further questions or concerns at this time.    [name], MSW, [place] Worker [facility]  Phone: [phone]

## 2018-02-21 NOTE — B[facility] Suicide Risk Assessment (Signed)
[place] INPATIENT:  Family/Significant Other Suicide Prevention Education  Suicide Prevention Education:  Education Completed; Emi Holes, friend [phone]) has been identified by the patient as the family member/significant other with whom the patient will be residing, and identified as the person(s) who will aid the patient in the event of a mental health crisis (suicidal ideations/suicide attempt).  With written consent from the patient, the family member/significant other has been provided the following suicide prevention education, prior to the and/or following the discharge of the patient.  The suicide prevention education provided includes the following:  Suicide risk factors  Suicide prevention and interventions  National Suicide Hotline telephone number  [facility] assessment telephone number  [facility] Emergency Assistance [place] and/or Residential Mobile Crisis Unit telephone number  Request made of family/significant other to:  Remove weapons (e.g., guns, rifles, knives), all items previously/currently identified as safety concern.    Remove drugs/medications (over-the-counter, prescriptions, illicit drugs), all items previously/currently identified as a safety concern.  The family member/significant other verbalizes understanding of the suicide prevention education information provided.  The family member/significant other agrees to remove the items of safety concern listed above.  Marylee Floras 02/21/2018, 4:06 PM

## 2018-02-21 NOTE — BHH Group Notes (Signed)
[facility] Group Notes:  (Nursing/MHT/Case Management/Adjunct)  Date:  02/21/2018  Time:  5:59 PM  Type of Therapy:  Psychoeducational Skills  Participation Level:  Did Not Attend  Participation Quality:    Affect:    Cognitive:    Insight:    Engagement in Group:    Modes of Intervention:    Summary of Progress/Problems:  Cammy Copa 02/21/2018, 5:59 PM

## 2018-02-21 NOTE — B[facility] Group Notes (Signed)
LCSW Group Therapy Note 02/21/2018 12:53 PM  Type of Therapy/Topic: Group Therapy: Balance in Life  Participation Level: Active  Description of Group:  This group will address the concept of balance and how it feels and looks when one is unbalanced. Patients will be encouraged to process areas in their lives that are out of balance and identify reasons for remaining unbalanced. Facilitators will guide patients in utilizing problem-solving interventions to address and correct the stressor making their life unbalanced. Understanding and applying boundaries will be explored and addressed for obtaining and maintaining a balanced life. Patients will be encouraged to explore ways to assertively make their unbalanced needs known to significant others in their lives, using other group members and facilitator for support and feedback.  Therapeutic Goals: 1. Patient will identify two or more emotions or situations they have that consume much of in their lives. 2. Patient will identify signs/triggers that life has become out of balance:  3. Patient will identify two ways to set boundaries in order to achieve balance in their lives:  4. Patient will demonstrate ability to communicate their needs through discussion and/or role plays  Summary of Patient Progress:  Jonte was engaged and participated throughout the group session. Raiya reports that balance is "a routine". Shunda states that her depressive symptoms causes her to become unbalanced and that she finds it challenging regaining her balance once she becomes depressed.     Therapeutic Modalities:  Cognitive Behavioral Therapy Solution-Focused Therapy Assertiveness Training   Theresa Duty Clinical Social Worker

## 2018-02-21 NOTE — Progress Notes (Signed)
Patient ID: Gina James, female   DOB: [birth year], 37 y.o.   MRN: [id number] D: Patient observed watching TV and interacting well with peers on approach. Pt reports feeling anxious about living situation with husband once discharged. Pt reports she is hoping for a family session before discharge. Pt attended evening wrap up group and engaged well with peers. Denies  SI/HI/AVH and pain.No behavioral issues noted.  A: Support and encouragement offered as needed to express needs. Medications administered as prescribed.  R: Patient is safe and cooperative on unit. Will continue to monitor  for safety and stability.
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
## 2018-02-21 NOTE — Progress Notes (Signed)
D:  Patient's self inventory sheet, patient sleeps good, no sleep medication given.  Good appetite, normal energy level, good concentration.  Denied depression and hopeless, rated anxiety #1.  Denied withdrawals.  Denied SI.  Physical problems, pain #6 in past 24 hours, shoulder, neck.  Pain medicine helpful.  Goal is work on discharge plan.  Plans to meet with SW.  Does have discharge plans.   A:  Medications administered per MD orders.  Emotional support and encouragement given patient. R:  Denied SI and HI, contracts for safety.  Denied A/V hallucinations.  Safety maintained with 15 minute checks.
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
## 2018-02-21 NOTE — [facility] Group Notes (Signed)
Adult Psychoeducational Group Note  Date:  02/21/2018 Time:  9:11 PM  Group Topic/Focus:  Wrap-Up Group:   The focus of this group is to help patients review their daily goal of treatment and discuss progress on daily workbooks.  Participation Level:  Active  Participation Quality:  Appropriate and Attentive  Affect:  Appropriate  Cognitive:  Alert and Appropriate  Insight: Appropriate and Good  Engagement in Group:  Engaged  Modes of Intervention:  Discussion and Education  Additional Comments:  Pt attended and participated in wrap up group this evening. Pt had a frustrating morning due to them not being on a schedule. Pt was frustrated about day groups being cancelled. Pt goal is to be discharged tomorrow.   Cristi Loron 02/21/2018, 9:11 PM

## 2018-02-21 NOTE — Plan of Care (Signed)
Eron reported that she was upset because she wasn't able to leave today.  "I normally would have blown up and got mad but today I just cried, talked to staff and was able to realize that a safe plan to go home is what needs to happen."  She was able to say that she is really proud of herself for trying to use new coping skills.  We will continue to monitor the progress towards her goals.

## 2018-02-21 NOTE — Plan of Care (Signed)
Nurse discussed anxiety, depression, coping skills with patient. 

## 2018-02-22 ENCOUNTER — Encounter (HOSPITAL_COMMUNITY): Payer: Self-pay | Admitting: Behavioral Health

## 2018-02-22 MED ORDER — VENLAFAXINE HCL ER 75 MG PO CP24: 225.0000 mg | ORAL_CAPSULE | Freq: Every day | ORAL | 0 refills | Status: DC

## 2018-02-22 MED ORDER — GABAPENTIN 300 MG PO CAPS: 300.0000 mg | ORAL_CAPSULE | Freq: Three times a day (TID) | ORAL | 0 refills | Status: DC

## 2018-02-22 MED ORDER — ARIPIPRAZOLE 10 MG PO TABS: 10.0000 mg | ORAL_TABLET | Freq: Every day | ORAL | 0 refills | Status: DC
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
## 2018-02-22 NOTE — Progress Notes (Signed)
  [facility] Adult Case Management Discharge Plan :  Will you be returning to the same living situation after discharge:  No. Patient reports she is returning home with a friend at discharge At discharge, do you have transportation home?: Yes,  patient's friend Jan will pick her up at discharge Do you have the ability to pay for your medications: Yes,  BCBS  Release of information consent forms completed and in the chart;  Patient's signature needed at discharge.  Patient to Follow up at: Follow-up Information    Chucky May, MD. Go on 02/25/2018.   Specialty:  Psychiatry Why:  Appointment with Dr. Toy Care is Saturday, 02/25/18 at 12:15pm. Please be sure to bring any discharge paperwork from the hospital and any medications list.  Contact information: [place] [place] [address] [phone]           Next level of care provider has access to [place] and Suicide Prevention discussed: Yes,  with the patient's friend  Have you used any form of tobacco in the last 30 days? (Cigarettes, Smokeless Tobacco, Cigars, and/or Pipes): No  Has patient been referred to the Quitline?: N/A patient is not a smoker  Patient has been referred for addiction treatment: N/A  Marylee Floras, [place] 02/22/2018, 10:54 AM

## 2018-02-22 NOTE — Progress Notes (Signed)
Recreation Therapy Notes  Date: 6.19.19 Time: 1000 Location: 300 Hall Dayroom  Group Topic: Stress Management  Goal Area(s) Addresses:  Patient will verbalize importance of using healthy stress management.  Patient will identify positive emotions associated with healthy stress management.   Intervention: Stress Management  Activity : Guided Imagery.  LRT introduced the stress management technique of guided imagery.  LRT read a script that lead patients through the process of taking a mental vacation.  Patients were to follow along as LRT read script to engage in activity.  Education: Stress Management, Discharge Planning.   Education Outcome: Acknowledges edcuation/In group clarification offered/Needs additional education  Clinical Observations/Feedback: Pt did not attend group.    Victorino Sparrow, LRT/CTRS          Victorino Sparrow A 02/22/2018 12:01 PM

## 2018-02-22 NOTE — Progress Notes (Signed)
Discharge note:  Patient discharged home per MD order.  Reviewed AVS/transition record with patient and she indicated understanding.  She denies any depressive symptoms or thoughts of self harm.  She will follow up with Dr. Toy Care.  Patient left ambulatory with a friend.  She received all personal belongings from her locker and room.

## 2018-02-22 NOTE — Tx Team (Signed)
Interdisciplinary Treatment and Diagnostic Plan Update  02/22/2018 Time of Session: 9:35am Gina James MRN: [id number]  Principal Diagnosis: Bipolar disorder, unspecified ([place])  Secondary Diagnoses: Principal Problem:   Bipolar disorder, unspecified ([place]) Active Problems:   MDD (major depressive disorder), recurrent severe, without psychosis ([place])   Suicidal ideation   Current Medications:  Current Facility-Administered Medications  Medication Dose Route Frequency Provider Last Rate Last Dose  . ARIPiprazole (ABILIFY) tablet 10 mg  10 mg Oral QHS Cobos, Myer Peer, MD   10 mg at 02/21/18 2133  . gabapentin (NEURONTIN) capsule 300 mg  300 mg Oral TID Rankin, Shuvon B, NP   300 mg at 02/22/18 0814  . levonorgestrel-ethinyl estradiol (SEASONALE,INTROVALE,JOLESSA) 0.15-0.03 MG per tablet 1 tablet  1 tablet Oral QHS Rankin, Shuvon B, NP   1 tablet at 02/21/18 2133  . loperamide (IMODIUM) capsule 2 mg  2 mg Oral PRN Mordecai Maes, NP   2 mg at 02/16/18 1452  . methocarbamol (ROBAXIN) tablet 750 mg  750 mg Oral BID PRN Rankin, Shuvon B, NP   750 mg at 02/22/18 0814  . venlafaxine XR (EFFEXOR-XR) 24 hr capsule 225 mg  225 mg Oral Q breakfast Rankin, Shuvon B, NP   225 mg at 02/21/18 2133   PTA Medications: Medications Prior to Admission  Medication Sig Dispense Refill Last Dose  . gabapentin (NEURONTIN) 300 MG capsule Take 1 capsule (300 mg total) by mouth 3 (three) times daily. For pain 30 capsule 0 Taking  . JOLESSA 0.15-0.03 MG tablet TAKE ONE TABLET BY MOUTH ONCE DAILY (Patient taking differently: TAKE ONE TABLET BY MOUTH AT BEDTIME.) 91 tablet 4 Taking  . methocarbamol (ROBAXIN) 750 MG tablet Take 1 tablet (750 mg total) by mouth 2 (two) times daily as needed for muscle spasms. 60 tablet 1 Taking  . venlafaxine XR (EFFEXOR-XR) 150 MG 24 hr capsule Take 1 capsule (150 mg total) by mouth daily with breakfast. For mood control 30 capsule 0 Taking    Patient Stressors: Marital or  family conflict Medication change or noncompliance  Patient Strengths: Ability for insight Average or above average intelligence Capable of independent living General fund of knowledge Motivation for treatment/growth  Treatment Modalities: Medication Management, Group therapy, Case management,  1 to 1 session with clinician, Psychoeducation, Recreational therapy.   Physician Treatment Plan for Primary Diagnosis: Bipolar disorder, unspecified ([name]) Long Term Goal(s): Improvement in symptoms so as ready for discharge Improvement in symptoms so as ready for discharge   Short Term Goals: Ability to identify changes in lifestyle to reduce recurrence of condition will improve Ability to demonstrate self-control will improve Ability to identify and develop effective coping behaviors will improve Ability to identify triggers associated with substance abuse/mental health issues will improve Ability to disclose and discuss suicidal ideas Ability to demonstrate self-control will improve Ability to identify and develop effective coping behaviors will improve Ability to maintain clinical measurements within normal limits will improve Compliance with prescribed medications will improve  Medication Management: Evaluate patient's response, side effects, and tolerance of medication regimen.  Therapeutic Interventions: 1 to 1 sessions, Unit Group sessions and Medication administration.  Evaluation of Outcomes: Adequate for Discharge  Physician Treatment Plan for Secondary Diagnosis: Principal Problem:   Bipolar disorder, unspecified ([name]) Active Problems:   MDD (major depressive disorder), recurrent severe, without psychosis ([place])   Suicidal ideation  Long Term Goal(s): Improvement in symptoms so as ready for discharge Improvement in symptoms so as ready for discharge   Short Term Goals:  Ability to identify changes in lifestyle to reduce recurrence of condition will improve Ability to  demonstrate self-control will improve Ability to identify and develop effective coping behaviors will improve Ability to identify triggers associated with substance abuse/mental health issues will improve Ability to disclose and discuss suicidal ideas Ability to demonstrate self-control will improve Ability to identify and develop effective coping behaviors will improve Ability to maintain clinical measurements within normal limits will improve Compliance with prescribed medications will improve     Medication Management: Evaluate patient's response, side effects, and tolerance of medication regimen.  Therapeutic Interventions: 1 to 1 sessions, Unit Group sessions and Medication administration.  Evaluation of Outcomes: Adequate for Discharge   RN Treatment Plan for Primary Diagnosis: Bipolar disorder, unspecified ([name]) Long Term Goal(s): Knowledge of disease and therapeutic regimen to maintain health will improve  Short Term Goals: Ability to verbalize frustration and anger appropriately will improve, Ability to demonstrate self-control, Ability to disclose and discuss suicidal ideas, Ability to identify and develop effective coping behaviors will improve and Compliance with prescribed medications will improve  Medication Management: RN will administer medications as ordered by provider, will assess and evaluate patient's response and provide education to patient for prescribed medication. RN will report any adverse and/or side effects to prescribing provider.  Therapeutic Interventions: 1 on 1 counseling sessions, Psychoeducation, Medication administration, Evaluate responses to treatment, Monitor vital signs and CBGs as ordered, Perform/monitor CIWA, COWS, AIMS and Fall Risk screenings as ordered, Perform wound care treatments as ordered.  Evaluation of Outcomes: Adequate for Discharge   LCSW Treatment Plan for Primary Diagnosis: Bipolar disorder, unspecified ([name]) Long Term Goal(s):  Safe transition to appropriate next level of care at discharge, Engage patient in therapeutic group addressing interpersonal concerns.  Short Term Goals: Engage patient in aftercare planning with referrals and resources, Increase social support, Increase ability to appropriately verbalize feelings, Increase emotional regulation, Facilitate acceptance of mental health diagnosis and concerns, Facilitate patient progression through stages of change regarding substance use diagnoses and concerns, Identify triggers associated with mental health/substance abuse issues and Increase skills for wellness and recovery  Therapeutic Interventions: Assess for all discharge needs, 1 to 1 time with Social worker, Explore available resources and support systems, Assess for adequacy in community support network, Educate family and significant other(s) on suicide prevention, Complete Psychosocial Assessment, Interpersonal group therapy.  Evaluation of Outcomes: Adequate for Discharge   Progress in Treatment: Attending groups: Yes. Participating in groups: Yes. Taking medication as prescribed: Yes. Toleration medication: Yes. Family/Significant other contact made: No, will contact:  patient refused consent for a collateral contact Patient understands diagnosis: Yes. Discussing patient identified problems/goals with staff: Yes. Medical problems stabilized or resolved: Yes. Denies suicidal/homicidal ideation: Yes. Issues/concerns per patient self-inventory: No. Other:   New problem(s) identified: None  New Short Term/Long Term Goal(s):Detox, medication stabilization, elimination of SI thoughts, development of comprehensive mental wellness plan.   Patient Goals: "Work on my emotions, take my medications and figure out my next step"  Discharge Plan or Barriers: CSW will continue to assess for an appropriate discharge plan  Reason for Continuation of Hospitalization: None.  Pt discharging today.  Estimated  Length of Stay: discharge today.  Attendees: Patient: 02/22/2018   Physician: Dr Myles Lipps, MD 02/22/2018   Nursing: Mayra Neer, RN 02/22/2018   RN Care Manager: 02/22/2018   Social Worker: Lurline Idol, LCSW 02/22/2018   Recreational Therapist:  02/22/2018   Other:  02/22/2018   Other:  02/22/2018   Other: 02/22/2018  Scribe for Treatment Team: Deirdre Evener 02/22/2018 11:04 AM

## 2018-02-22 NOTE — Plan of Care (Signed)
  Problem: Education: Goal: Knowledge of  General Education information/materials will improve Outcome: Completed/Met Goal: Emotional status will improve Outcome: Completed/Met Goal: Mental status will improve Outcome: Completed/Met Goal: Verbalization of understanding the information provided will improve Outcome: Completed/Met   Problem: Activity: Goal: Interest or engagement in activities will improve Outcome: Completed/Met Goal: Sleeping patterns will improve Outcome: Completed/Met   Problem: Coping: Goal: Ability to verbalize frustrations and anger appropriately will improve Outcome: Completed/Met Goal: Ability to demonstrate self-control will improve Outcome: Completed/Met   Problem: Health Behavior/Discharge Planning: Goal: Identification of resources available to assist in meeting health care needs will improve Outcome: Completed/Met Goal: Compliance with treatment plan for underlying cause of condition will improve Outcome: Completed/Met   Problem: Physical Regulation: Goal: Ability to maintain clinical measurements within normal limits will improve Outcome: Completed/Met   Problem: Safety: Goal: Periods of time without injury will increase Outcome: Completed/Met   Problem: Education: Goal: Utilization of techniques to improve thought processes will improve Outcome: Completed/Met Goal: Knowledge of the prescribed therapeutic regimen will improve Outcome: Completed/Met   Problem: Activity: Goal: Interest or engagement in leisure activities will improve Outcome: Completed/Met Goal: Imbalance in normal sleep/wake cycle will improve Outcome: Completed/Met   Problem: Coping: Goal: Coping ability will improve Outcome: Completed/Met Goal: Will verbalize feelings Outcome: Completed/Met   Problem: Health Behavior/Discharge Planning: Goal: Ability to make decisions will improve Outcome: Completed/Met Goal: Compliance with therapeutic regimen will  improve Outcome: Completed/Met   Problem: Role Relationship: Goal: Will demonstrate positive changes in social behaviors and relationships Outcome: Completed/Met   Problem: Safety: Goal: Ability to disclose and discuss suicidal ideas will improve Outcome: Completed/Met Goal: Ability to identify and utilize support systems that promote safety will improve Outcome: Completed/Met   Problem: Self-Concept: Goal: Will verbalize positive feelings about self Outcome: Completed/Met Goal: Level of anxiety will decrease Outcome: Completed/Met   Problem: Education: Goal: Ability to make informed decisions regarding treatment will improve Outcome: Completed/Met   Problem: Coping: Goal: Coping ability will improve Outcome: Completed/Met   Problem: Health Behavior/Discharge Planning: Goal: Identification of resources available to assist in meeting health care needs will improve Outcome: Completed/Met   Problem: Medication: Goal: Compliance with prescribed medication regimen will improve Outcome: Completed/Met   Problem: Self-Concept: Goal: Ability to disclose and discuss suicidal ideas will improve Outcome: Completed/Met Goal: Will verbalize positive feelings about self Outcome: Completed/Met   Problem: Activity: Goal: Will identify at least one activity in which they can participate Outcome: Completed/Met   Problem: Coping: Goal: Ability to identify and develop effective coping behavior will improve Outcome: Completed/Met Goal: Ability to interact with others will improve Outcome: Completed/Met Goal: Demonstration of participation in decision-making regarding own care will improve Outcome: Completed/Met Goal: Ability to use eye contact when communicating with others will improve Outcome: Completed/Met   Problem: Health Behavior/Discharge Planning: Goal: Identification of resources available to assist in meeting health care needs will improve Outcome: Completed/Met    Problem: Self-Concept: Goal: Will verbalize positive feelings about self Outcome: Completed/Met   Problem: Education: Goal: Knowledge of General Education information will improve Outcome: Completed/Met   Problem: Health Behavior/Discharge Planning: Goal: Ability to manage health-related needs will improve Outcome: Completed/Met   Problem: Coping: Goal: Level of anxiety will decrease Outcome: Completed/Met   Problem: Pain Managment: Goal: General experience of comfort will improve Outcome: Completed/Met   Problem: Safety: Goal: Ability to remain free from injury will improve Outcome: Completed/Met   Problem: Spiritual Needs Goal: Ability to function at adequate level Outcome: Completed/Met

## 2018-02-22 NOTE — [facility] Suicide Risk Assessment (Signed)
[facility] Discharge Suicide Risk Assessment   Principal Problem: Bipolar disorder, unspecified [facility]) Discharge Diagnoses:  Patient Active Problem List   Diagnosis Date Noted  . Suicidal ideation [R45.851] 02/16/2018  . MDD (major depressive disorder), recurrent severe, without psychosis ([place]) [F33.2] 02/15/2018  . Bipolar disorder, unspecified ([place]) [F31.9] 11/04/2017  . Cervical disc disorder with radiculopathy of cervical region [M50.10] 10/21/2017  . S/P spinal surgery [Z98.890] 08/12/2017  . DDD (degenerative disc disease), lumbar [M51.36] 01/13/2017  . Depression with anxiety [F41.8] 08/04/2016  . 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 PMDD (premenstrual dysphoric disorder) [F32.81] 07/29/2015  . Preventative health care [Z00.00] 10/29/2014  . Obesity [E66.9] 06/10/2014  . 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 Lumbar pain with radiation down both legs [M54.5] 09/11/2013  . Generalized anxiety disorder [F41.1] 06/18/2013    Total Time spent with patient: 30 minutes  Musculoskeletal: Strength & Muscle Tone: within normal limits Gait & Station: normal Patient leans: N/A  Psychiatric Specialty Exam: Review of Systems  All other systems reviewed and are negative.   Blood pressure 123/79, pulse 92, temperature 98.9 F (37.2 C), temperature source Oral, resp. rate 20, height 5\' 4"  (1.626 m), weight 106.1 kg (234 lb), SpO2 100 %.Body mass index is 40.17 kg/m.  General Appearance: Casual  Eye Contact::  Good  Speech:  Clear and Coherent409  Volume:  Normal  Mood:  Euthymic  Affect:  Congruent  Thought Process:  Coherent  Orientation:  Full (Time, Place, and Person)  Thought Content:  Logical  Suicidal Thoughts:  No  Homicidal Thoughts:  No  Memory:  Immediate;   Good Recent;   Good Remote;   Good  Judgement:  Intact  Insight:  Fair  Psychomotor Activity:  Normal  Concentration:  Good  Recall:  Good  Fund of Knowledge:Good   Language: Good  Akathisia:  Negative  Handed:  Right  AIMS (if indicated):     Assets:  Communication Skills Desire for Improvement Housing Physical Health Resilience Social Support  Sleep:  Number of Hours: 6  Cognition: WNL  ADL's:  Intact   Mental Status Per Nursing Assessment::   On Admission:  Suicidal ideation indicated by patient, Self-harm thoughts, Self-harm behaviors, Belief that plan would result in death  Demographic Factors:  Caucasian  Loss Factors: NA  Historical Factors: Impulsivity  Risk Reduction Factors:   Sense of responsibility to family, Living with another person, especially a relative and Positive social support  Continued Clinical Symptoms:  Depression:   Impulsivity  Cognitive Features That Contribute To Risk:  None    Suicide Risk:  Minimal: No identifiable suicidal ideation.  Patients presenting with no risk factors but with morbid ruminations; may be classified as minimal risk based on the severity of the depressive symptoms  Follow-up Information    Chucky May, MD. Go on 02/25/2018.   Specialty:  Psychiatry Why:  Appointment with Dr. Toy Care is Saturday, 02/25/18 at 12:15pm. Please be sure to bring any discharge paperwork from the hospital and any medications list.  Contact information: [place] [place] [address] [phone]           Plan Of Care/Follow-up recommendations:  Activity:  ad lib  Sharma Covert, MD 02/22/2018, 7:54 AM

## 2018-02-24 ENCOUNTER — Telehealth: Payer: Self-pay

## 2018-02-24 ENCOUNTER — Encounter: Payer: Self-pay | Admitting: Family

## 2018-02-24 NOTE — Telephone Encounter (Signed)
Called patient to schedule appointment with M. Inda Castle for follow up. Left message for return call.Marland Kitchen

## 2018-02-25 DIAGNOSIS — F411 Generalized anxiety disorder: Secondary | ICD-10-CM | POA: Diagnosis not present

## 2018-02-25 DIAGNOSIS — F331 Major depressive disorder, recurrent, moderate: Secondary | ICD-10-CM | POA: Diagnosis not present

## 2018-02-27 ENCOUNTER — Other Ambulatory Visit: Payer: Self-pay

## 2018-02-27 ENCOUNTER — Encounter (HOSPITAL_BASED_OUTPATIENT_CLINIC_OR_DEPARTMENT_OTHER): Payer: Self-pay | Admitting: Emergency Medicine

## 2018-02-27 ENCOUNTER — Emergency Department (HOSPITAL_BASED_OUTPATIENT_CLINIC_OR_DEPARTMENT_OTHER)
Admission: EM | Admit: 2018-02-27 | Discharge: 2018-02-27 | Disposition: A | Discharge status: Home / Self Care | Payer: BLUE CROSS/BLUE SHIELD | Attending: Emergency Medicine | Admitting: Emergency Medicine

## 2018-02-27 DIAGNOSIS — G43909 Migraine, unspecified, not intractable, without status migrainosus: Secondary | ICD-10-CM | POA: Insufficient documentation

## 2018-02-27 DIAGNOSIS — G43009 Migraine without aura, not intractable, without status migrainosus: Secondary | ICD-10-CM

## 2018-02-27 DIAGNOSIS — Z87891 Personal history of nicotine dependence: Secondary | ICD-10-CM | POA: Insufficient documentation

## 2018-02-27 DIAGNOSIS — Z79899 Other long term (current) drug therapy: Secondary | ICD-10-CM | POA: Diagnosis not present

## 2018-02-27 MED ORDER — KETOROLAC TROMETHAMINE 30 MG/ML IJ SOLN
30.0000 mg | Freq: Once | INTRAMUSCULAR | Status: AC
  Administered 2018-02-27: 30 mg via INTRAVENOUS
  Filled 2018-02-27: qty 1

## 2018-02-27 MED ORDER — PROCHLORPERAZINE EDISYLATE 10 MG/2ML IJ SOLN
10.0000 mg | Freq: Once | INTRAMUSCULAR | Status: AC
  Administered 2018-02-27: 10 mg via INTRAVENOUS
  Filled 2018-02-27: qty 2

## 2018-02-27 MED ORDER — DIPHENHYDRAMINE HCL 50 MG/ML IJ SOLN
25.0000 mg | Freq: Once | INTRAMUSCULAR | Status: AC
  Administered 2018-02-27: 25 mg via INTRAVENOUS
  Filled 2018-02-27: qty 1

## 2018-02-27 NOTE — Telephone Encounter (Signed)
Attempted to reach pt and left message to check mychart message. Message sent to pt.

## 2018-02-27 NOTE — ED Triage Notes (Signed)
C/o HA that started at 0200. Pt states she tried Robaxin without relief. Pt states hx of migraines, and that this feels typical of her migraines. Reports nausea, photophobia.

## 2018-02-27 NOTE — ED Notes (Signed)
EDP into room, prior to RN assessment, see MD notes, pending orders.   

## 2018-02-27 NOTE — ED Notes (Signed)
Alert, NAD, calm, interactive, resps e/u, speaking in clear complete sentences, no dyspnea noted, skin W&D, VSS, c/o HA, describes as usual migraine, also NV (Vx1), and dizziness, "feels triggered by hormones and cervical neck issues, (denies: other pain, fever, diarrhea, sob, or visual changes). Onset at 1900, no relief with benadryl, robaxin or sleep.

## 2018-02-27 NOTE — ED Provider Notes (Signed)
[facility] EMERGENCY DEPARTMENT Provider Note   CSN: [id number] Arrival date & time: 02/27/18  0327     History   Chief Complaint Chief Complaint  Patient presents with  . Migraine    HPI Gina James is a 37 y.o. female.  HPI  This is a 37 year old female with a history of migraines who presents with headache.  Patient reports worsening headache at 2 AM.  She took Robaxin with no significant relief.  Patient has a history of migraines and states that this is similar.  It starts behind her eyes and radiates backwards.  She rates her pain a 10 out of 10.  She reports photophobia and associated nausea.  She denies any vision changes, weakness, numbness, tingling.  She does not take any maintenance migraine medications.  She denies any fevers or neck stiffness.  She denies any recent infectious symptoms or upper respiratory symptoms.  Past Medical History:  Diagnosis Date  . Ankle fracture, right   . Anxiety   . Arthritis    "in my back" (04/08/2015)  . Chronic lower back pain   . Complication of anesthesia    difficulty urinating after anesthesia  . Headache    "monthly" (04/08/2015)  . Post partum depression     Patient Active Problem List   Diagnosis Date Noted  . Suicidal ideation 02/16/2018  . MDD (major depressive disorder), recurrent severe, without psychosis ([place]) 02/15/2018  . Bipolar disorder, unspecified ([place]) 11/04/2017  . Cervical disc disorder with radiculopathy of cervical region 10/21/2017  . S/P spinal surgery 08/12/2017  . DDD (degenerative disc disease), lumbar 01/13/2017  . Depression with anxiety 08/04/2016  . 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 PMDD (premenstrual dysphoric disorder) 07/29/2015  . Preventative health care 10/29/2014  . Obesity 06/10/2014  . Chronic meniscal tear of knee 11/23/2013  . Lumbar pain with radiation down both legs 09/11/2013  . Generalized anxiety disorder 06/18/2013    Past Surgical  History:  Procedure Laterality Date  . ANTERIOR FUSION LUMBAR SPINE  07/06/2017  . BREAST SURGERY    . CESAREAN SECTION  2007 and 2009  . COMBINED ABDOMINOPLASTY AND LIPOSUCTION  12/2014   "liposuction in thighs"  . FRACTURE SURGERY    . LAPAROSCOPIC ABDOMINAL EXPLORATION  1997   "to check for endometriosis"  . LAPAROSCOPIC CHOLECYSTECTOMY  2007  . OPEN REDUCTION INTERNAL FIXATION (ORIF) TIBIA/FIBULA FRACTURE Right 04/08/2015   Procedure: OPEN REDUCTION INTERNAL FIXATION (ORIF)  RIGHT TIBIA PILON FRACTURE;  Surgeon: Wylene Simmer, MD;  Location: [facility];  Service: Orthopedics;  Laterality: Right;  . TIBIA FRACTURE SURGERY Right 04/08/2015   ORIF tibial pilon     OB History   None      Home Medications    Prior to Admission medications   Medication Sig Start Date End Date Taking? Authorizing Provider  ARIPiprazole (ABILIFY) 10 MG tablet Take 1 tablet (10 mg total) by mouth at bedtime. 02/22/18   Mordecai Maes, NP  gabapentin (NEURONTIN) 300 MG capsule Take 1 capsule (300 mg total) by mouth 3 (three) times daily. 02/22/18   Mordecai Maes, NP  JOLESSA 0.15-0.03 MG tablet TAKE ONE TABLET BY MOUTH ONCE DAILY Patient taking differently: TAKE ONE TABLET BY MOUTH AT BEDTIME. 04/26/17   Debbrah Alar, NP  methocarbamol (ROBAXIN) 750 MG tablet Take 1 tablet (750 mg total) by mouth 2 (two) times daily as needed for muscle spasms. 10/21/17   Dene Gentry, MD  venlafaxine XR (EFFEXOR-XR) 75 MG  24 hr capsule Take 3 capsules (225 mg total) by mouth daily with breakfast. 02/22/18   Mordecai Maes, NP    Family History Family History  Problem Relation Age of Onset  . Diabetes Father   . Cancer Sister 30       breast, had chemo/double mastectomy  . Heart disease Maternal Grandfather   . Heart disease Paternal Grandfather     Social History Social History   Tobacco Use  . Smoking status: Former Smoker    Packs/day: 0.12    Years: 10.00    Pack years: 1.20    Types: Cigarettes     Last attempt to quit: 09/06/2004    Years since quitting: 13.4  . Smokeless tobacco: Never Used  Substance Use Topics  . Alcohol use: Not Currently    Alcohol/week: 2.4 oz    Types: 4 Cans of beer per week  . Drug use: Yes    Types: Marijuana     Allergies   Ciprofloxacin; Lexapro [escitalopram]; Trintellix [vortioxetine]; Amoxicillin; Ceclor [cefaclor]; Erythromycin base; Penicillins; Phenergan [promethazine hcl]; Sulfa antibiotics; and Zithromax [azithromycin]   Review of Systems Review of Systems  Constitutional: Negative for fever.  Eyes: Positive for photophobia. Negative for visual disturbance.  Respiratory: Negative for shortness of breath.   Cardiovascular: Negative for chest pain.  Gastrointestinal: Positive for nausea. Negative for abdominal pain and vomiting.  Neurological: Positive for headaches. Negative for weakness and numbness.  All other systems reviewed and are negative.    Physical Exam Updated Vital Signs BP 123/82 (BP Location: Right Arm)   Pulse 88   Temp 98 F (36.7 C)   Resp 20   LMP 02/27/2018   SpO2 100%   Physical Exam  Constitutional: She is oriented to person, place, and time. She appears well-developed and well-nourished.  Appears uncomfortable but nontoxic  HENT:  Head: Normocephalic and atraumatic.  Eyes: Pupils are equal, round, and reactive to light. EOM are normal.  Neck: Normal range of motion. Neck supple.  No meningismus  Cardiovascular: Normal rate, regular rhythm and normal heart sounds.  Pulmonary/Chest: Effort normal. No respiratory distress. She has no wheezes.  Abdominal: Soft. Bowel sounds are normal.  Neurological: She is alert and oriented to person, place, and time.  Fluent speech, cranial nerves II through XII intact, 5 out of 5 strength in all 4 extremities  Skin: Skin is warm and dry.  Psychiatric: She has a normal mood and affect.  Nursing note and vitals reviewed.    ED Treatments / Results  Labs (all labs  ordered are listed, but only abnormal results are displayed) Labs Reviewed - No data to display  EKG None  Radiology No results found.  Procedures Procedures (including critical care time)  Medications Ordered in ED Medications  prochlorperazine (COMPAZINE) injection 10 mg (10 mg Intravenous Given 02/27/18 0402)  diphenhydrAMINE (BENADRYL) injection 25 mg (25 mg Intravenous Given 02/27/18 0401)  ketorolac (TORADOL) 30 MG/ML injection 30 mg (30 mg Intravenous Given 02/27/18 0401)     Initial Impression / Assessment and Plan / ED Course  I have reviewed the triage vital signs and the nursing notes.  Pertinent labs & imaging results that were available during my care of the patient were reviewed by me and considered in my medical decision making (see chart for details).  Clinical Course as of Feb 28 436  Mon Feb 27, 2018  1610 Much improved on reassessment.  Continues to be neurologically intact.  Reports pain is now 0 out  of 10.  Will discharge home.   [CH]    Clinical Course User Index [CH] Horton, Barbette Hair, MD    Patient presents with headache classic for her migraines.  She is overall nontoxic on exam.  Nonfocal.  No signs or symptoms of meningitis.  Doubt subarachnoid hemorrhage.  Patient was given Toradol, Compazine, and Benadryl.  On recheck, she reports complete resolution of her symptoms.  She remains neurologically intact.  Suspect migraine.  Will discharge patient home with PCP follow-up.  After history, exam, and medical workup I feel the patient has been appropriately medically screened and is safe for discharge home. Pertinent diagnoses were discussed with the patient. Patient was given return precautions.   Final Clinical Impressions(s) / ED Diagnoses   Final diagnoses:  Migraine without aura and without status migrainosus, not intractable    ED Discharge Orders    None       Merryl Hacker, MD 02/27/18 [phone]

## 2018-02-27 NOTE — ED Notes (Signed)
EDP int room

## 2018-02-27 NOTE — Telephone Encounter (Signed)
See mychart message, If she has been off of birth control, needs nurse visit for urine hcg and then I can send.  When was LMP?

## 2018-03-29 NOTE — Telephone Encounter (Signed)
Attempted to reach pt and left message to check mychart message from 02/24/18.

## 2018-04-07 ENCOUNTER — Ambulatory Visit (INDEPENDENT_AMBULATORY_CARE_PROVIDER_SITE_OTHER): Payer: BLUE CROSS/BLUE SHIELD | Admitting: Family

## 2018-04-07 NOTE — Progress Notes (Signed)
Pt came out of exam room stating she had to be somewhere and could not wait to be seen. Appt was at 9am. Pt said she would call back to reschedule.

## 2018-04-11 DIAGNOSIS — F411 Generalized anxiety disorder: Secondary | ICD-10-CM | POA: Diagnosis not present

## 2018-04-11 DIAGNOSIS — F3342 Major depressive disorder, recurrent, in full remission: Secondary | ICD-10-CM | POA: Diagnosis not present

## 2018-04-18 DIAGNOSIS — M542 Cervicalgia: Secondary | ICD-10-CM | POA: Diagnosis not present

## 2018-04-18 DIAGNOSIS — M5020 Other cervical disc displacement, unspecified cervical region: Secondary | ICD-10-CM | POA: Diagnosis not present

## 2018-04-28 DIAGNOSIS — Z9889 Other specified postprocedural states: Secondary | ICD-10-CM | POA: Diagnosis not present

## 2018-04-28 DIAGNOSIS — M50323 Other cervical disc degeneration at C6-C7 level: Secondary | ICD-10-CM | POA: Diagnosis not present

## 2018-04-28 DIAGNOSIS — M5020 Other cervical disc displacement, unspecified cervical region: Secondary | ICD-10-CM | POA: Diagnosis not present

## 2018-04-28 DIAGNOSIS — M4712 Other spondylosis with myelopathy, cervical region: Secondary | ICD-10-CM | POA: Diagnosis not present

## 2018-04-28 DIAGNOSIS — M4722 Other spondylosis with radiculopathy, cervical region: Secondary | ICD-10-CM | POA: Diagnosis not present

## 2018-04-28 DIAGNOSIS — M50223 Other cervical disc displacement at C6-C7 level: Secondary | ICD-10-CM | POA: Diagnosis not present

## 2018-04-28 DIAGNOSIS — F419 Anxiety disorder, unspecified: Secondary | ICD-10-CM | POA: Diagnosis not present

## 2018-04-28 DIAGNOSIS — M50322 Other cervical disc degeneration at C5-C6 level: Secondary | ICD-10-CM | POA: Diagnosis not present

## 2018-04-28 DIAGNOSIS — F329 Major depressive disorder, single episode, unspecified: Secondary | ICD-10-CM | POA: Diagnosis not present

## 2018-04-28 DIAGNOSIS — M4802 Spinal stenosis, cervical region: Secondary | ICD-10-CM | POA: Diagnosis not present

## 2018-04-28 DIAGNOSIS — Z87891 Personal history of nicotine dependence: Secondary | ICD-10-CM | POA: Diagnosis not present

## 2018-04-29 MED ORDER — HYDROCODONE-ACETAMINOPHEN 5-325 MG PO TABS
2.00 | ORAL_TABLET | ORAL | Status: DC
Start: ? — End: 2018-04-29

## 2018-04-29 MED ORDER — HYDROCODONE-ACETAMINOPHEN 5-325 MG PO TABS
1.00 | ORAL_TABLET | ORAL | Status: DC
Start: ? — End: 2018-04-29

## 2018-04-29 MED ORDER — GENERIC EXTERNAL MEDICATION
225.00 | Status: DC
Start: 2018-04-30 — End: 2018-04-29

## 2018-04-29 MED ORDER — LORAZEPAM 1 MG PO TABS
1.00 | ORAL_TABLET | ORAL | Status: DC
Start: ? — End: 2018-04-29

## 2018-04-29 MED ORDER — METHOCARBAMOL 750 MG PO TABS
750.00 | ORAL_TABLET | ORAL | Status: DC
Start: ? — End: 2018-04-29

## 2018-04-29 MED ORDER — ARIPIPRAZOLE 10 MG PO TABS
10.00 | ORAL_TABLET | ORAL | Status: DC
Start: 2018-04-29 — End: 2018-04-29

## 2018-04-29 MED ORDER — ONDANSETRON HCL 4 MG/2ML IJ SOLN
4.00 | INTRAMUSCULAR | Status: DC
Start: ? — End: 2018-04-29

## 2018-04-29 MED ORDER — GABAPENTIN 600 MG PO TABS
1200.00 | ORAL_TABLET | ORAL | Status: DC
Start: 2018-04-29 — End: 2018-04-29

## 2018-04-29 MED ORDER — GENERIC EXTERNAL MEDICATION
17.00 g | Status: DC
Start: ? — End: 2018-04-29

## 2018-04-29 MED ORDER — CETIRIZINE HCL 10 MG PO TABS
10.00 | ORAL_TABLET | ORAL | Status: DC
Start: ? — End: 2018-04-29

## 2018-04-29 MED ORDER — NAPROXEN 250 MG PO TABS
250.00 | ORAL_TABLET | ORAL | Status: DC
Start: 2018-04-29 — End: 2018-04-29

## 2018-05-23 DIAGNOSIS — F3181 Bipolar II disorder: Secondary | ICD-10-CM | POA: Diagnosis not present

## 2018-05-29 ENCOUNTER — Ambulatory Visit: Payer: BLUE CROSS/BLUE SHIELD | Admitting: Family

## 2018-05-29 ENCOUNTER — Other Ambulatory Visit (HOSPITAL_COMMUNITY)
Admission: RE | Admit: 2018-05-29 | Discharge: 2018-05-29 | Disposition: A | Discharge status: Home / Self Care | Payer: BLUE CROSS/BLUE SHIELD | Source: Ambulatory Visit | Attending: Family | Admitting: Family

## 2018-05-29 ENCOUNTER — Encounter: Payer: Self-pay | Admitting: Family

## 2018-05-29 VITALS — BP 108/72 | HR 101 | Temp 99.6°F | Resp 18 | Ht 64.0 in | Wt 224.6 lb

## 2018-05-29 DIAGNOSIS — N760 Acute vaginitis: Secondary | ICD-10-CM | POA: Diagnosis not present

## 2018-05-29 DIAGNOSIS — F329 Major depressive disorder, single episode, unspecified: Secondary | ICD-10-CM

## 2018-05-29 DIAGNOSIS — Z01419 Encounter for gynecological examination (general) (routine) without abnormal findings: Secondary | ICD-10-CM

## 2018-05-29 DIAGNOSIS — Z309 Encounter for contraceptive management, unspecified: Secondary | ICD-10-CM | POA: Diagnosis not present

## 2018-05-29 DIAGNOSIS — F32A Depression, unspecified: Secondary | ICD-10-CM

## 2018-05-29 DIAGNOSIS — B9689 Other specified bacterial agents as the cause of diseases classified elsewhere: Secondary | ICD-10-CM | POA: Insufficient documentation

## 2018-05-29 MED ORDER — FLUCONAZOLE 150 MG PO TABS: ORAL_TABLET | ORAL | 0 refills | Status: DC

## 2018-05-29 MED ORDER — LEVONORGEST-ETH ESTRAD 91-DAY 0.15-0.03 MG PO TABS: 1.0000 | ORAL_TABLET | Freq: Every day | ORAL | 3 refills | Status: DC

## 2018-05-29 NOTE — Progress Notes (Signed)
Subjective:    Patient ID: Gina James, female    DOB: 1980/10/20, 37 y.o.   MRN: [id number]  HPI  Patient is a 37 yr old female who presents today to discuss contraception.  LMP 05/09/18. Due for pap smear.    Vaginal itching/discharge/discharge. Monistat helped a little bit. Denies vaginal odor.  Reports some right adnexal pain which is typical for her during ovulation.   Depression- reports mood is good. She is working with Dr. Toy Care.     Review of Systems   See HPI  Past Medical History:  Diagnosis Date  . Ankle fracture, right   . Anxiety   . Arthritis    "in my back" (04/08/2015)  . Chronic lower back pain   . Complication of anesthesia    difficulty urinating after anesthesia  . Headache    "monthly" (04/08/2015)  . Post partum depression      Social History   Socioeconomic History  . Marital status: Married    Spouse name: Not on file  . Number of children: Not on file  . Years of education: Not on file  . Highest education level: Not on file  Occupational History  . Not on file  Social Needs  . Financial resource strain: Not on file  . Food insecurity:    Worry: Not on file    Inability: Not on file  . Transportation needs:    Medical: Not on file    Non-medical: Not on file  Tobacco Use  . Smoking status: Former Smoker    Packs/day: 0.12    Years: 10.00    Pack years: 1.20    Types: Cigarettes    Last attempt to quit: 09/06/2004    Years since quitting: 13.7  . Smokeless tobacco: Never Used  Substance and Sexual Activity  . Alcohol use: Not Currently    Alcohol/week: 4.0 standard drinks    Types: 4 Cans of beer per week  . Drug use: Yes    Types: Marijuana  . Sexual activity: Not Currently    Comment: vasectomy in partner  Lifestyle  . Physical activity:    Days per week: Not on file    Minutes per session: Not on file  . Stress: Not on file  Relationships  . Social connections:    Talks on phone: Not on file    Gets together: Not on  file    Attends religious service: Not on file    Active member of club or organization: Not on file    Attends meetings of clubs or organizations: Not on file    Relationship status: Not on file  . Intimate partner violence:    Fear of current or ex partner: Not on file    Emotionally abused: Not on file    Physically abused: Not on file    Forced sexual activity: Not on file  Other Topics Concern  . 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        Past Surgical History:  Procedure Laterality Date  . ANTERIOR FUSION LUMBAR SPINE  07/06/2017  . BREAST SURGERY    . CESAREAN SECTION  2007 and 2009  . COMBINED ABDOMINOPLASTY AND LIPOSUCTION  12/2014   "liposuction in thighs"  . FRACTURE SURGERY    .  LAPAROSCOPIC ABDOMINAL EXPLORATION  1997   "to check for endometriosis"  . LAPAROSCOPIC CHOLECYSTECTOMY  2007  . OPEN REDUCTION INTERNAL FIXATION (ORIF) TIBIA/FIBULA FRACTURE Right 04/08/2015   Procedure: OPEN REDUCTION INTERNAL FIXATION (ORIF)  RIGHT TIBIA PILON FRACTURE;  Surgeon: Wylene Simmer, MD;  Location: [facility];  Service: Orthopedics;  Laterality: Right;  . TIBIA FRACTURE SURGERY Right 04/08/2015   ORIF tibial pilon    Family History  Problem Relation Age of Onset  . Diabetes Father   . Cancer Sister 30       breast, had chemo/double mastectomy  . Heart disease Maternal Grandfather   . Heart disease Paternal Grandfather     Allergies  Allergen Reactions  . Ciprofloxacin Hives  . Lexapro [Escitalopram] Nausea And Vomiting  . Trintellix [Vortioxetine]   . Amoxicillin Hives and Other (See Comments)    Has patient had a PCN reaction causing immediate rash, facial/tongue/throat swelling, SOB or lightheadedness with hypotension: no Has patient had a PCN reaction causing severe rash involving mucus  membranes or skin necrosis: no Has patient had a PCN reaction that required hospitalization: no Has patient had a PCN reaction occurring within the last 10 years: no - childhood reaction If all of the above answers are "NO", then may proceed with Cephalosporin use.   . Ceclor [Cefaclor] Hives  . Erythromycin Base Rash  . Penicillins Hives and Other (See Comments)    Has patient had a PCN reaction causing immediate rash, facial/tongue/throat swelling, SOB or lightheadedness with hypotension: no Has patient had a PCN reaction causing severe rash involving mucus membranes or skin necrosis: no Has patient had a PCN reaction that required hospitalization: no Has patient had a PCN reaction occurring within the last 10 years: no - childhood reaction If all of the above answers are "NO", then may proceed with Cephalosporin use.  Marland Kitchen Phenergan [Promethazine Hcl] Hives  . Sulfa Antibiotics Hives  . Zithromax [Azithromycin] Hives    Current Outpatient Medications on File Prior to Visit  Medication Sig Dispense Refill  . ARIPiprazole (ABILIFY) 10 MG tablet Take 1 tablet (10 mg total) by mouth at bedtime. 30 tablet 0  . gabapentin (NEURONTIN) 300 MG capsule Take 1 capsule (300 mg total) by mouth 3 (three) times daily. 90 capsule 0  . methocarbamol (ROBAXIN) 750 MG tablet Take 1 tablet (750 mg total) by mouth 2 (two) times daily as needed for muscle spasms. 60 tablet 1  . venlafaxine XR (EFFEXOR-XR) 75 MG 24 hr capsule Take 3 capsules (225 mg total) by mouth daily with breakfast. 90 capsule 0   No current facility-administered medications on file prior to visit.     BP 108/72 (BP Location: Left Arm, Cuff Size: Large)   Pulse (!) 101   Temp 99.6 F (37.6 C) (Oral)   Resp 18   Ht 5\' 4"  (1.626 m)   Wt 224 lb 9.6 oz (101.9 kg)   LMP 05/09/2018   SpO2 98%   BMI 38.55 kg/m       Objective:   Physical Exam  Constitutional: She is oriented to person, place, and time. She appears well-developed  and well-nourished.  Cardiovascular: Normal rate, regular rhythm and normal heart sounds.  No murmur heard. Pulmonary/Chest: Effort normal and breath sounds normal. No respiratory distress. She has no wheezes.  Genitourinary: Cervix exhibits no motion tenderness. Right adnexum displays no mass and no tenderness. Left adnexum displays no mass and no tenderness. Vaginal discharge found.  Neurological: She is alert and oriented to  person, place, and time. No cranial nerve deficit. Coordination normal.  Skin: Skin is warm and dry.  Psychiatric: She has a normal mood and affect. Her behavior is normal. Judgment and thought content normal.          Assessment & Plan:  Contraceptive management- wishes to restart Jolessa. Obtain UDS, if negative begin tonight and use condoms for next 2 weeks for back up birth control.   Depression- stable and following with psychiatry.  Vaginitis- looks clinically like yeast.  Send pap for BV, yeast, gardnerella, GC/Chlamydia. Empiric rx diflucan. Pap performed today.

## 2018-05-29 NOTE — Addendum Note (Signed)
Addended by: Kelle Darting A on: 05/29/2018 02:59 PM   Modules accepted: Orders

## 2018-05-29 NOTE — Patient Instructions (Signed)
Begin Jolessa tonight.  Use back up birth control for the next 2 weeks (condoms).

## 2018-05-30 ENCOUNTER — Other Ambulatory Visit (INDEPENDENT_AMBULATORY_CARE_PROVIDER_SITE_OTHER): Payer: BLUE CROSS/BLUE SHIELD

## 2018-05-30 DIAGNOSIS — Z309 Encounter for contraceptive management, unspecified: Secondary | ICD-10-CM | POA: Diagnosis not present

## 2018-05-30 LAB — POCT URINE PREGNANCY: Preg Test, Ur: NEGATIVE

## 2018-05-31 LAB — CYTOLOGY - PAP
Bacterial vaginitis: NEGATIVE
CHLAMYDIA, DNA PROBE: NEGATIVE
Candida vaginitis: NEGATIVE
DIAGNOSIS: NEGATIVE
HPV: NOT DETECTED
NEISSERIA GONORRHEA: NEGATIVE
TRICH (WINDOWPATH): NEGATIVE

## 2018-06-02 ENCOUNTER — Emergency Department (HOSPITAL_BASED_OUTPATIENT_CLINIC_OR_DEPARTMENT_OTHER)
Admission: EM | Admit: 2018-06-02 | Discharge: 2018-06-02 | Disposition: A | Discharge status: Home / Self Care | Payer: BLUE CROSS/BLUE SHIELD | Attending: Emergency Medicine | Admitting: Emergency Medicine

## 2018-06-02 ENCOUNTER — Other Ambulatory Visit: Payer: Self-pay

## 2018-06-02 ENCOUNTER — Encounter (HOSPITAL_BASED_OUTPATIENT_CLINIC_OR_DEPARTMENT_OTHER): Payer: Self-pay | Admitting: Emergency Medicine

## 2018-06-02 DIAGNOSIS — F1210 Cannabis abuse, uncomplicated: Secondary | ICD-10-CM | POA: Diagnosis not present

## 2018-06-02 DIAGNOSIS — Z87891 Personal history of nicotine dependence: Secondary | ICD-10-CM | POA: Insufficient documentation

## 2018-06-02 DIAGNOSIS — Z79899 Other long term (current) drug therapy: Secondary | ICD-10-CM | POA: Diagnosis not present

## 2018-06-02 DIAGNOSIS — R110 Nausea: Secondary | ICD-10-CM | POA: Diagnosis not present

## 2018-06-02 DIAGNOSIS — G4489 Other headache syndrome: Secondary | ICD-10-CM | POA: Diagnosis not present

## 2018-06-02 DIAGNOSIS — R51 Headache: Secondary | ICD-10-CM | POA: Diagnosis not present

## 2018-06-02 MED ORDER — DIPHENHYDRAMINE HCL 50 MG/ML IJ SOLN
25.0000 mg | Freq: Once | INTRAMUSCULAR | Status: AC
  Administered 2018-06-02: 25 mg via INTRAVENOUS
  Filled 2018-06-02: qty 1

## 2018-06-02 MED ORDER — PROCHLORPERAZINE EDISYLATE 10 MG/2ML IJ SOLN
10.0000 mg | Freq: Once | INTRAMUSCULAR | Status: AC
  Administered 2018-06-02: 10 mg via INTRAVENOUS
  Filled 2018-06-02: qty 2

## 2018-06-02 MED ORDER — KETOROLAC TROMETHAMINE 30 MG/ML IJ SOLN
30.0000 mg | Freq: Once | INTRAMUSCULAR | Status: AC
  Administered 2018-06-02: 30 mg via INTRAVENOUS
  Filled 2018-06-02: qty 1

## 2018-06-02 NOTE — ED Triage Notes (Signed)
Patient presents with co a migraine headache; onset last pm with nausea and photophobia; nad noted steady gait noted when ambulatory to room.

## 2018-06-02 NOTE — ED Provider Notes (Signed)
[facility] EMERGENCY DEPARTMENT Provider Note   CSN: [id number] Arrival date & time: 06/02/18  4098     History   Chief Complaint Chief Complaint  Patient presents with  . Migraine    HPI Gina James is a 37 y.o. female.  The history is provided by the patient.  Migraine  This is a new problem. The current episode started yesterday. The problem occurs constantly. The problem has been gradually worsening. Associated symptoms include headaches. Nothing aggravates the symptoms. Nothing relieves the symptoms.  Patient with history of migraines, anxiety presents with headache.  She reports it began gradually yesterday and continues.  No fevers or vomiting.  No visual disturbances.  She reports mild nausea.  No focal weakness.  This is very similar to prior migraines.  No recent travel.  No tick bites or rashes She had cervical spine surgery last month with [facility], no complications.  No neck pain.  No new weakness. Past Medical History:  Diagnosis Date  . Ankle fracture, right   . Anxiety   . Arthritis    "in my back" (04/08/2015)  . Chronic lower back pain   . Complication of anesthesia    difficulty urinating after anesthesia  . Headache    "monthly" (04/08/2015)  . Post partum depression     Patient Active Problem List   Diagnosis Date Noted  . Suicidal ideation 02/16/2018  . MDD (major depressive disorder), recurrent severe, without psychosis ([place]) 02/15/2018  . Bipolar disorder, unspecified ([place]) 11/04/2017  . Cervical disc disorder with radiculopathy of cervical region 10/21/2017  . S/P spinal surgery 08/12/2017  . DDD (degenerative disc disease), lumbar 01/13/2017  . Depression with anxiety 08/04/2016  . 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 PMDD (premenstrual dysphoric disorder) 07/29/2015  . Preventative health care 10/29/2014  . Obesity 06/10/2014  . Chronic meniscal tear of knee 11/23/2013  . Lumbar pain with radiation down  both legs 09/11/2013  . Generalized anxiety disorder 06/18/2013    Past Surgical History:  Procedure Laterality Date  . ANTERIOR FUSION LUMBAR SPINE  07/06/2017  . BREAST SURGERY    . CESAREAN SECTION  2007 and 2009  . COMBINED ABDOMINOPLASTY AND LIPOSUCTION  12/2014   "liposuction in thighs"  . FRACTURE SURGERY    . LAPAROSCOPIC ABDOMINAL EXPLORATION  1997   "to check for endometriosis"  . LAPAROSCOPIC CHOLECYSTECTOMY  2007  . OPEN REDUCTION INTERNAL FIXATION (ORIF) TIBIA/FIBULA FRACTURE Right 04/08/2015   Procedure: OPEN REDUCTION INTERNAL FIXATION (ORIF)  RIGHT TIBIA PILON FRACTURE;  Surgeon: Wylene Simmer, MD;  Location: [facility];  Service: Orthopedics;  Laterality: Right;  . TIBIA FRACTURE SURGERY Right 04/08/2015   ORIF tibial pilon     OB History   None      Home Medications    Prior to Admission medications   Medication Sig Start Date End Date Taking? Authorizing Provider  LORazepam (ATIVAN) 0.5 MG tablet Take 0.5 mg by mouth every 8 (eight) hours.   Yes [provider]  ARIPiprazole (ABILIFY) 10 MG tablet Take 1 tablet (10 mg total) by mouth at bedtime. 02/22/18   Mordecai Maes, NP  fluconazole (DIFLUCAN) 150 MG tablet 1 tab by mouth today then repeat in 3 days if symptoms persist. 05/29/18   Debbrah Alar, NP  gabapentin (NEURONTIN) 300 MG capsule Take 1 capsule (300 mg total) by mouth 3 (three) times daily. 02/22/18   Mordecai Maes, NP  levonorgestrel-ethinyl estradiol (JOLESSA) 0.15-0.03 MG tablet Take 1 tablet  by mouth at bedtime. 05/29/18   Debbrah Alar, NP  methocarbamol (ROBAXIN) 750 MG tablet Take 1 tablet (750 mg total) by mouth 2 (two) times daily as needed for muscle spasms. 10/21/17   Dene Gentry, MD  venlafaxine XR (EFFEXOR-XR) 75 MG 24 hr capsule Take 3 capsules (225 mg total) by mouth daily with breakfast. 02/22/18   Mordecai Maes, NP    Family History Family History  Problem Relation Age of Onset  . Diabetes Father   . Cancer  Sister 30       breast, had chemo/double mastectomy  . Heart disease Maternal Grandfather   . Heart disease Paternal Grandfather     Social History Social History   Tobacco Use  . Smoking status: Former Smoker    Packs/day: 0.12    Years: 10.00    Pack years: 1.20    Types: Cigarettes    Last attempt to quit: 09/06/2004    Years since quitting: 13.7  . Smokeless tobacco: Never Used  Substance Use Topics  . Alcohol use: Not Currently    Alcohol/week: 4.0 standard drinks    Types: 4 Cans of beer per week  . Drug use: Yes    Types: Marijuana     Allergies   Ciprofloxacin; Lexapro [escitalopram]; Trintellix [vortioxetine]; Amoxicillin; Ceclor [cefaclor]; Erythromycin base; Penicillins; Phenergan [promethazine hcl]; Sulfa antibiotics; and Zithromax [azithromycin]   Review of Systems Review of Systems  Constitutional: Negative for fever.  Eyes: Negative for visual disturbance.  Gastrointestinal: Positive for nausea. Negative for vomiting.  Musculoskeletal: Negative for neck pain.  Neurological: Positive for headaches. Negative for weakness.  All other systems reviewed and are negative.    Physical Exam Updated Vital Signs BP 117/80 (BP Location: Left Arm)   Pulse 97   Temp 98.4 F (36.9 C) (Oral)   Resp 18   Ht 1.626 m (5\' 4" )   Wt 104.3 kg   LMP 05/09/2018   SpO2 100%   BMI 39.48 kg/m   Physical Exam CONSTITUTIONAL: Well developed/well nourished HEAD: Normocephalic/atraumatic EYES: EOMI/PERRL, no nystagmus, no ptosis ENMT: Mucous membranes moist NECK: supple no meningeal signs, no bruits, well-healed anterior neck scar SPINE/BACK:entire spine nontender CV: S1/S2 noted, no murmurs/rubs/gallops noted LUNGS: Lungs are clear to auscultation bilaterally, no apparent distress ABDOMEN: soft, nontender, no rebound or guarding GU:no cva tenderness NEURO:Awake/alert, face symmetric, no arm or leg drift is noted Equal 5/5 strength with shoulder abduction, elbow  flex/extension, wrist flex/extension in upper extremities and equal hand grips bilaterally Equal 5/5 strength with hip flexion,knee flex/extension, foot dorsi/plantar flexion Cranial nerves 3/4/5/6/03/14/09/11/12 tested and intact No past pointing Sensation to light touch intact in all extremities EXTREMITIES: pulses normal, full ROM SKIN: warm, color normal PSYCH: no abnormalities of mood noted, alert and oriented to situation    ED Treatments / Results  Labs (all labs ordered are listed, but only abnormal results are displayed) Labs Reviewed - No data to display  EKG None  Radiology No results found.  Procedures Procedures  Medications Ordered in ED Medications  prochlorperazine (COMPAZINE) injection 10 mg (10 mg Intravenous Given 06/02/18 0547)  ketorolac (TORADOL) 30 MG/ML injection 30 mg (30 mg Intravenous Given 06/02/18 0552)  diphenhydrAMINE (BENADRYL) injection 25 mg (25 mg Intravenous Given 06/02/18 0542)     Initial Impression / Assessment and Plan / ED Course  I have reviewed the triage vital signs and the nursing notes.      5:48 AM Patient with a history of migraines presents with headache.  She has no neurologic deficits.  She is afebrile at this time.  She reports this is similar to prior episodes.  Will treat headache and reassess.  She has had a good response to Compazine/Benadryl/Toradol in the past 6:55 AM She feels improved but is now too sleepy to drive.  She will need to rest for another hour and can be discharged.  Signed out to Dr. Maryan Rued at shift change  Final Clinical Impressions(s) / ED Diagnoses   Final diagnoses:  Other headache syndrome    ED Discharge Orders    None       Ripley Fraise, MD 06/02/18 [phone]

## 2018-06-02 NOTE — ED Notes (Signed)
Pt ambulated to bathroom without assist.  Gait steady, no acute distress.  Pt states she is feeling much better.  No dizziness or somnolence noted at this time.

## 2018-06-02 NOTE — Discharge Instructions (Signed)

## 2018-06-09 DIAGNOSIS — M4722 Other spondylosis with radiculopathy, cervical region: Secondary | ICD-10-CM | POA: Insufficient documentation

## 2018-06-09 DIAGNOSIS — Z981 Arthrodesis status: Secondary | ICD-10-CM | POA: Insufficient documentation

## 2018-06-12 ENCOUNTER — Ambulatory Visit: Payer: BLUE CROSS/BLUE SHIELD | Admitting: Family

## 2018-06-12 VITALS — BP 120/78 | HR 86 | Temp 99.5°F | Resp 16 | Ht 64.0 in | Wt 229.0 lb

## 2018-06-12 DIAGNOSIS — M25562 Pain in left knee: Secondary | ICD-10-CM | POA: Diagnosis not present

## 2018-06-12 MED ORDER — MELOXICAM 7.5 MG PO TABS: 7.5000 mg | ORAL_TABLET | Freq: Every day | ORAL | 0 refills | Status: DC

## 2018-06-12 NOTE — Progress Notes (Signed)
Subjective:    Patient ID: Gina James, female    DOB: 1980-10-13, 37 y.o.   MRN: [id number]  HPI  Patient is a 37 yr old female who presents today with chief complaint of left sided knee pain.  Pain has been present x 1 week.  No known injury. She has been taking aleve or ibuprofen, icing and doing PT exercises she found online. Notes some improvement with these measures.   Review of Systems See HPI  Past Medical History:  Diagnosis Date  . Ankle fracture, right   . Anxiety   . Arthritis    "in my back" (04/08/2015)  . Chronic lower back pain   . Complication of anesthesia    difficulty urinating after anesthesia  . Headache    "monthly" (04/08/2015)  . Post partum depression      Social History   Socioeconomic History  . Marital status: Married    Spouse name: Not on file  . Number of children: Not on file  . Years of education: Not on file  . Highest education level: Not on file  Occupational History  . Not on file  Social Needs  . Financial resource strain: Not on file  . Food insecurity:    Worry: Not on file    Inability: Not on file  . Transportation needs:    Medical: Not on file    Non-medical: Not on file  Tobacco Use  . Smoking status: Former Smoker    Packs/day: 0.12    Years: 10.00    Pack years: 1.20    Types: Cigarettes    Last attempt to quit: 09/06/2004    Years since quitting: 13.7  . Smokeless tobacco: Never Used  Substance and Sexual Activity  . Alcohol use: Not Currently    Alcohol/week: 4.0 standard drinks    Types: 4 Cans of beer per week  . Drug use: Yes    Types: Marijuana  . Sexual activity: Not Currently    Comment: vasectomy in partner  Lifestyle  . Physical activity:    Days per week: Not on file    Minutes per session: Not on file  . Stress: Not on file  Relationships  . Social connections:    Talks on phone: Not on file    Gets together: Not on file    Attends religious service: Not on file    Active member of club  or organization: Not on file    Attends meetings of clubs or organizations: Not on file    Relationship status: Not on file  . Intimate partner violence:    Fear of current or ex partner: Not on file    Emotionally abused: Not on file    Physically abused: Not on file    Forced sexual activity: Not on file  Other Topics Concern  . 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        Past Surgical History:  Procedure Laterality Date  . ANTERIOR FUSION LUMBAR SPINE  07/06/2017  . BREAST SURGERY    . CESAREAN SECTION  2007 and 2009  . COMBINED ABDOMINOPLASTY AND LIPOSUCTION  12/2014   "liposuction in thighs"  . FRACTURE SURGERY    . LAPAROSCOPIC ABDOMINAL EXPLORATION  1997   "to  check for endometriosis"  . LAPAROSCOPIC CHOLECYSTECTOMY  2007  . OPEN REDUCTION INTERNAL FIXATION (ORIF) TIBIA/FIBULA FRACTURE Right 04/08/2015   Procedure: OPEN REDUCTION INTERNAL FIXATION (ORIF)  RIGHT TIBIA PILON FRACTURE;  Surgeon: Wylene Simmer, MD;  Location: [facility];  Service: Orthopedics;  Laterality: Right;  . TIBIA FRACTURE SURGERY Right 04/08/2015   ORIF tibial pilon    Family History  Problem Relation Age of Onset  . Diabetes Father   . Cancer Sister 30       breast, had chemo/double mastectomy  . Heart disease Maternal Grandfather   . Heart disease Paternal Grandfather     Allergies  Allergen Reactions  . Ciprofloxacin Hives  . Lexapro [Escitalopram] Nausea And Vomiting  . Trintellix [Vortioxetine]   . Amoxicillin Hives and Other (See Comments)    Has patient had a PCN reaction causing immediate rash, facial/tongue/throat swelling, SOB or lightheadedness with hypotension: no Has patient had a PCN reaction causing severe rash involving mucus membranes or skin necrosis: no Has patient had a PCN reaction that required  hospitalization: no Has patient had a PCN reaction occurring within the last 10 years: no - childhood reaction If all of the above answers are "NO", then may proceed with Cephalosporin use.   . Ceclor [Cefaclor] Hives  . Erythromycin Base Rash  . Penicillins Hives and Other (See Comments)    Has patient had a PCN reaction causing immediate rash, facial/tongue/throat swelling, SOB or lightheadedness with hypotension: no Has patient had a PCN reaction causing severe rash involving mucus membranes or skin necrosis: no Has patient had a PCN reaction that required hospitalization: no Has patient had a PCN reaction occurring within the last 10 years: no - childhood reaction If all of the above answers are "NO", then may proceed with Cephalosporin use.  Marland Kitchen Phenergan [Promethazine Hcl] Hives  . Sulfa Antibiotics Hives  . Zithromax [Azithromycin] Hives    Current Outpatient Medications on File Prior to Visit  Medication Sig Dispense Refill  . ARIPiprazole (ABILIFY) 10 MG tablet Take 1 tablet (10 mg total) by mouth at bedtime. 30 tablet 0  . fluconazole (DIFLUCAN) 150 MG tablet 1 tab by mouth today then repeat in 3 days if symptoms persist. 2 tablet 0  . gabapentin (NEURONTIN) 300 MG capsule Take 1 capsule (300 mg total) by mouth 3 (three) times daily. 90 capsule 0  . levonorgestrel-ethinyl estradiol (JOLESSA) 0.15-0.03 MG tablet Take 1 tablet by mouth at bedtime. 91 tablet 3  . LORazepam (ATIVAN) 0.5 MG tablet Take 0.5 mg by mouth every 8 (eight) hours.    . methocarbamol (ROBAXIN) 750 MG tablet Take 1 tablet (750 mg total) by mouth 2 (two) times daily as needed for muscle spasms. 60 tablet 1  . venlafaxine XR (EFFEXOR-XR) 75 MG 24 hr capsule Take 3 capsules (225 mg total) by mouth daily with breakfast. 90 capsule 0   No current facility-administered medications on file prior to visit.     BP 120/78 (BP Location: Left Arm, Patient Position: Sitting, Cuff Size: Large)   Pulse 86   Temp 99.5 F  (37.5 C) (Oral)   Resp 16   Ht 5\' 4"  (1.626 m)   Wt 229 lb (103.9 kg)   SpO2 98%   BMI 39.31 kg/m       Objective:   Physical Exam  Constitutional: She is oriented to person, place, and time. She appears well-developed and well-nourished.  Cardiovascular: Normal rate, regular rhythm and normal heart sounds.  No murmur heard. Pulmonary/Chest:  Effort normal and breath sounds normal. No respiratory distress. She has no wheezes.  Musculoskeletal:  Left knee is without tenderness or swelling Neg drawer sign.   Neurological: She is alert and oriented to person, place, and time.  Skin: Skin is warm and dry.  Psychiatric: She has a normal mood and affect. Her behavior is normal. Judgment and thought content normal.          Assessment & Plan:  Left knee pain- advised pt as follows:  Please begin meloxicam once daily.  (she is advised not to take with aleve or motrin) Continue to ice your knee twice daily. Call if pain worsens or if not improved in 1-2 weeks and we will plan referral to sports medicine. Pt verbalizes understanding.

## 2018-06-12 NOTE — Patient Instructions (Signed)
Please begin meloxicam once daily.   Continue to ice your knee twice daily. Call if pain worsens or if not improved in 1-2 weeks and we will plan referral to sports medicine.

## 2018-06-20 ENCOUNTER — Encounter: Payer: Self-pay | Admitting: Family

## 2018-06-20 ENCOUNTER — Ambulatory Visit (INDEPENDENT_AMBULATORY_CARE_PROVIDER_SITE_OTHER): Payer: BLUE CROSS/BLUE SHIELD | Admitting: Family

## 2018-06-20 ENCOUNTER — Other Ambulatory Visit: Payer: Self-pay | Admitting: Family

## 2018-06-20 VITALS — BP 118/88 | HR 81 | Temp 99.5°F | Resp 16 | Ht 65.0 in | Wt 226.0 lb

## 2018-06-20 DIAGNOSIS — Z0000 Encounter for general adult medical examination without abnormal findings: Secondary | ICD-10-CM | POA: Diagnosis not present

## 2018-06-20 LAB — CBC WITH DIFFERENTIAL/PLATELET
BASOS PCT: 1.2 % (ref 0.0–3.0)
Basophils Absolute: 0.1 10*3/uL (ref 0.0–0.1)
EOS PCT: 4.1 % (ref 0.0–5.0)
Eosinophils Absolute: 0.3 10*3/uL (ref 0.0–0.7)
HCT: 43.3 % (ref 36.0–46.0)
Hemoglobin: 14.3 g/dL (ref 12.0–15.0)
Lymphocytes Relative: 38.2 % (ref 12.0–46.0)
Lymphs Abs: 2.5 10*3/uL (ref 0.7–4.0)
MCHC: 33 g/dL (ref 30.0–36.0)
MCV: 86.4 fl (ref 78.0–100.0)
MONO ABS: 0.4 10*3/uL (ref 0.1–1.0)
Monocytes Relative: 5.9 % (ref 3.0–12.0)
NEUTROS PCT: 50.6 % (ref 43.0–77.0)
Neutro Abs: 3.3 10*3/uL (ref 1.4–7.7)
PLATELETS: 301 10*3/uL (ref 150.0–400.0)
RBC: 5.02 Mil/uL (ref 3.87–5.11)
RDW: 13.2 % (ref 11.5–15.5)
WBC: 6.5 10*3/uL (ref 4.0–10.5)

## 2018-06-20 LAB — URINALYSIS, ROUTINE W REFLEX MICROSCOPIC
BILIRUBIN URINE: NEGATIVE
KETONES UR: NEGATIVE
LEUKOCYTES UA: NEGATIVE
Nitrite: NEGATIVE
PH: 6 (ref 5.0–8.0)
Specific Gravity, Urine: 1.015 (ref 1.000–1.030)
Total Protein, Urine: NEGATIVE
Urine Glucose: NEGATIVE
Urobilinogen, UA: 0.2 (ref 0.0–1.0)
WBC UA: NONE SEEN (ref 0–?)

## 2018-06-20 LAB — LIPID PANEL
CHOL/HDL RATIO: 5
Cholesterol: 223 mg/dL — ABNORMAL HIGH (ref 0–200)
HDL: 43.5 mg/dL (ref >39.00)
LDL Cholesterol: 150 mg/dL — ABNORMAL HIGH (ref 0–99)
NONHDL: 179.9
TRIGLYCERIDES: 151 mg/dL — AB (ref 0.0–149.0)
VLDL: 30.2 mg/dL (ref 0.0–40.0)

## 2018-06-20 LAB — BASIC METABOLIC PANEL
BUN: 12 mg/dL (ref 6–23)
CO2: 28 mEq/L (ref 19–32)
Calcium: 10 mg/dL (ref 8.4–10.5)
Chloride: 102 mEq/L (ref 96–112)
Creatinine, Ser: 0.83 mg/dL (ref 0.40–1.20)
GFR: 81.44 mL/min (ref >60.00)
GLUCOSE: 83 mg/dL (ref 70–99)
POTASSIUM: 4.7 meq/L (ref 3.5–5.1)
Sodium: 138 mEq/L (ref 135–145)

## 2018-06-20 LAB — HEPATIC FUNCTION PANEL
ALK PHOS: 72 U/L (ref 39–117)
ALT: 22 U/L (ref 0–35)
AST: 17 U/L (ref 0–37)
Albumin: 4.4 g/dL (ref 3.5–5.2)
BILIRUBIN DIRECT: 0.1 mg/dL (ref 0.0–0.3)
Total Bilirubin: 0.4 mg/dL (ref 0.2–1.2)
Total Protein: 7.3 g/dL (ref 6.0–8.3)

## 2018-06-20 LAB — TSH: TSH: 1.31 u[IU]/mL (ref 0.35–4.50)

## 2018-06-20 NOTE — Progress Notes (Signed)
Subjective:    Patient ID: Gina James, female    DOB: 11-06-1980, 37 y.o.   MRN: [id number]  HPI  Patient presents today for complete physical.  Immunizations: td 2016,  Declines flu shot Diet:  Wt Readings from Last 3 Encounters:  06/20/18 226 lb (102.5 kg)  06/12/18 229 lb (103.9 kg)  06/02/18 230 lb (104.3 kg)  Exercise: yoga Pap Smear: 05/29/18 Vision: due Dental: up to date   Review of Systems  Constitutional: Negative for unexpected weight change.  HENT: Negative for hearing loss and rhinorrhea.   Eyes: Negative for visual disturbance.  Respiratory: Negative for cough.   Cardiovascular: Negative for leg swelling.  Gastrointestinal: Negative for constipation and diarrhea.  Genitourinary: Negative for dysuria, frequency, hematuria and menstrual problem.  Musculoskeletal: Negative for myalgias.       Left knee pain is intermittent  Skin: Negative for rash.  Neurological: Positive for headaches.       Reports hormonal migraines  Hematological: Negative for adenopathy.  Psychiatric/Behavioral:       + depression but denies SI/HI   Past Medical History:  Diagnosis Date  . Ankle fracture, right   . Anxiety   . Arthritis    "in my back" (04/08/2015)  . Chronic lower back pain   . Complication of anesthesia    difficulty urinating after anesthesia  . Headache    "monthly" (04/08/2015)  . Post partum depression      Social History   Socioeconomic History  . Marital status: Married    Spouse name: Not on file  . Number of children: Not on file  . Years of education: Not on file  . Highest education level: Not on file  Occupational History  . Not on file  Social Needs  . Financial resource strain: Not on file  . Food insecurity:    Worry: Not on file    Inability: Not on file  . Transportation needs:    Medical: Not on file    Non-medical: Not on file  Tobacco Use  . Smoking status: Former Smoker    Packs/day: 0.12    Years: 10.00    Pack years:  1.20    Types: Cigarettes    Last attempt to quit: 09/06/2004    Years since quitting: 13.7  . Smokeless tobacco: Never Used  Substance and Sexual Activity  . Alcohol use: Not Currently    Alcohol/week: 4.0 standard drinks    Types: 4 Cans of beer per week  . Drug use: Yes    Types: Marijuana  . Sexual activity: Not Currently    Comment: vasectomy in partner  Lifestyle  . Physical activity:    Days per week: Not on file    Minutes per session: Not on file  . Stress: Not on file  Relationships  . Social connections:    Talks on phone: Not on file    Gets together: Not on file    Attends religious service: Not on file    Active member of club or organization: Not on file    Attends meetings of clubs or organizations: Not on file    Relationship status: Not on file  . Intimate partner violence:    Fear of current or ex partner: Not on file    Emotionally abused: Not on file    Physically abused: Not on file    Forced sexual activity: Not on file  Other Topics Concern  . 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        Past Surgical History:  Procedure Laterality Date  . ANTERIOR FUSION LUMBAR SPINE  07/06/2017  . BREAST SURGERY    . CESAREAN SECTION  2007 and 2009  . COMBINED ABDOMINOPLASTY AND LIPOSUCTION  12/2014   "liposuction in thighs"  . FRACTURE SURGERY    . LAPAROSCOPIC ABDOMINAL EXPLORATION  1997   "to check for endometriosis"  . LAPAROSCOPIC CHOLECYSTECTOMY  2007  . OPEN REDUCTION INTERNAL FIXATION (ORIF) TIBIA/FIBULA FRACTURE Right 04/08/2015   Procedure: OPEN REDUCTION INTERNAL FIXATION (ORIF)  RIGHT TIBIA PILON FRACTURE;  Surgeon: Wylene Simmer, MD;  Location: [facility];  Service: Orthopedics;  Laterality: Right;  . TIBIA FRACTURE SURGERY Right 04/08/2015   ORIF tibial pilon    Family  History  Problem Relation Age of Onset  . Diabetes Father   . Cancer Sister 30       breast, had chemo/double mastectomy  . Heart disease Maternal Grandfather   . Heart disease Paternal Grandfather     Allergies  Allergen Reactions  . Ciprofloxacin Hives  . Lexapro [Escitalopram] Nausea And Vomiting  . Trintellix [Vortioxetine]   . Amoxicillin Hives and Other (See Comments)    Has patient had a PCN reaction causing immediate rash, facial/tongue/throat swelling, SOB or lightheadedness with hypotension: no Has patient had a PCN reaction causing severe rash involving mucus membranes or skin necrosis: no Has patient had a PCN reaction that required hospitalization: no Has patient had a PCN reaction occurring within the last 10 years: no - childhood reaction If all of the above answers are "NO", then may proceed with Cephalosporin use.   . Ceclor [Cefaclor] Hives  . Erythromycin Base Rash  . Penicillins Hives and Other (See Comments)    Has patient had a PCN reaction causing immediate rash, facial/tongue/throat swelling, SOB or lightheadedness with hypotension: no Has patient had a PCN reaction causing severe rash involving mucus membranes or skin necrosis: no Has patient had a PCN reaction that required hospitalization: no Has patient had a PCN reaction occurring within the last 10 years: no - childhood reaction If all of the above answers are "NO", then may proceed with Cephalosporin use.  Marland Kitchen Phenergan [Promethazine Hcl] Hives  . Sulfa Antibiotics Hives  . Zithromax [Azithromycin] Hives    Current Outpatient Medications on File Prior to Visit  Medication Sig Dispense Refill  . ARIPiprazole (ABILIFY) 10 MG tablet Take 1 tablet (10 mg total) by mouth at bedtime. 30 tablet 0  . gabapentin (NEURONTIN) 300 MG capsule Take 1 capsule (300 mg total) by mouth 3 (three) times daily. 90 capsule 0  . levonorgestrel-ethinyl estradiol (JOLESSA) 0.15-0.03 MG tablet Take 1 tablet by mouth at  bedtime. 91 tablet 3  . LORazepam (ATIVAN) 0.5 MG tablet Take 0.5 mg by mouth every 8 (eight) hours.    . meloxicam (MOBIC) 7.5 MG tablet Take 1 tablet (7.5 mg total) by mouth daily. 14 tablet 0  . methocarbamol (ROBAXIN) 750 MG tablet Take 1 tablet (750 mg total) by mouth 2 (two) times daily as needed for muscle spasms. 60 tablet 1  . venlafaxine XR (EFFEXOR-XR) 75 MG 24 hr capsule Take 3 capsules (225 mg total) by mouth daily with breakfast. 90 capsule 0   No current facility-administered medications on  file prior to visit.     BP 118/88 (BP Location: Right Arm, Patient Position: Sitting, Cuff Size: Large)   Pulse 81   Temp 99.5 F (37.5 C) (Oral)   Resp 16   Ht 5\' 5"  (1.651 m)   Wt 226 lb (102.5 kg)   SpO2 98%   BMI 37.61 kg/m       Objective:   Physical Exam  Physical Exam  Constitutional: She is oriented to person, place, and time. She appears well-developed and well-nourished. No distress.  HENT:  Head: Normocephalic and atraumatic.  Right Ear: Tympanic membrane and ear canal normal.  Left Ear: Tympanic membrane and ear canal normal.  Mouth/Throat: Oropharynx is clear and moist.  Eyes: Pupils are equal, round, and reactive to light. No scleral icterus.  Neck: Normal range of motion. No thyromegaly present.  Cardiovascular: Normal rate and regular rhythm.   No murmur heard. Pulmonary/Chest: Effort normal and breath sounds normal. No respiratory distress. He has no wheezes. She has no rales. She exhibits no tenderness.  Abdominal: Soft. Bowel sounds are normal. She exhibits no distension and no mass. There is no tenderness. There is no rebound and no guarding.  Musculoskeletal: She exhibits no edema.  Lymphadenopathy:    She has no cervical adenopathy.  Neurological: She is alert and oriented to person, place, and time. She has normal patellar reflexes. She exhibits normal muscle tone. Coordination normal.  Skin: Skin is warm and dry.  Psychiatric: She has slightly  flattened mood and affect. Her behavior is normal. Judgment and thought content normal.  Breasts: Examined lying Right: Without masses, retractions, discharge or axillary adenopathy.  Left: Without masses, retractions, discharge or axillary adenopathy.  Bilateral breast implants- calcified which limits exam.            Assessment & Plan:   Preventative care- discussed diet/exercise/weight loss. Declines flu shot . Pap is up to date. Obtain routine lab work. She is moving to [place] temporarily.  She has lost custody of her children and her husband has a restraining order against her. She reports that she does feel depressed but is doing better currently and denies SI/HI. She requested a refill on her xanax but I advised her to follow up with her psychiatrist for this and to make sure to get set up with a psychiatrist in [place] as soon as possible. Pt verbalizes understanding.       Assessment & Plan:

## 2018-06-20 NOTE — Telephone Encounter (Signed)
Pt is requesting refill on lorazepam. I do not see where you have ever filled this.

## 2018-06-20 NOTE — Telephone Encounter (Signed)
Requested medication (s) are due for refill today:   Not prescribed by Debbrah Alar, NP.  Requested medication (s) are on the active medication list:   Yes  Future visit scheduled:   Had CPE today at 10:20am (06/20/18)  With Melissa O'Sullivan,NP.   She instructed pt to f/u with psych. For refills of this medication.     Last ordered: Doesn't say on med list only that it's a historical provider.   Requested Prescriptions  Pending Prescriptions Disp Refills   LORazepam (ATIVAN) 0.5 MG tablet 30 tablet     Sig: Take 1 tablet (0.5 mg total) by mouth every 8 (eight) hours.     Not Delegated - Psychiatry:  Anxiolytics/Hypnotics Failed - 06/20/2018 12:22 PM      Failed - This refill cannot be delegated      Passed - Urine Drug Screen completed in last 360 days.      Passed - Valid encounter within last 6 months    Recent Outpatient Visits          Today Preventative health care   [facility] [place], NP   1 week ago Acute pain of left knee   [facility], NP   3 weeks ago Acute vaginitis   [facility], NP   2 months ago Erroneous encounter - Counselling psychologist at [facility] [place], NP   6 months ago [facility] [place], [name]

## 2018-06-20 NOTE — Patient Instructions (Addendum)
Please complete lab work prior to leaving. Continue to work on [employer], exercise, weight loss.  Good luck with everything!

## 2018-06-20 NOTE — Telephone Encounter (Signed)
Copied from [place] [id number]. Topic: Quick Communication - Rx Refill/Question >> Jun 20, 2018 12:20 PM Yvette Rack wrote: Medication: LORazepam (ATIVAN) 0.5 MG tablet  Has the patient contacted their pharmacy? no  Preferred Pharmacy (with phone number or street name): [facility], [place] - [address] [phone] (Phone) [phone] (Fax)  Agent: Please be advised that RX refills may take up to 3 business days. We ask that you follow-up with your pharmacy.

## 2018-06-21 ENCOUNTER — Encounter: Payer: Self-pay | Admitting: Family

## 2018-06-22 ENCOUNTER — Encounter: Payer: Self-pay | Admitting: Family

## 2018-07-08 IMAGING — XA Imaging study
1 series · 1 of 1 positions shown · non-contrast
Comparison: none

CLINICAL DATA: Degenerative disc disease L5-S1. Significant relief
after the previous injection, without side effect or complication.
Partial recurrence of symptoms. The patient wishes to repeat.

[Series 1: ortho standard · 1 of 1 slices shown]
[im 1/1]
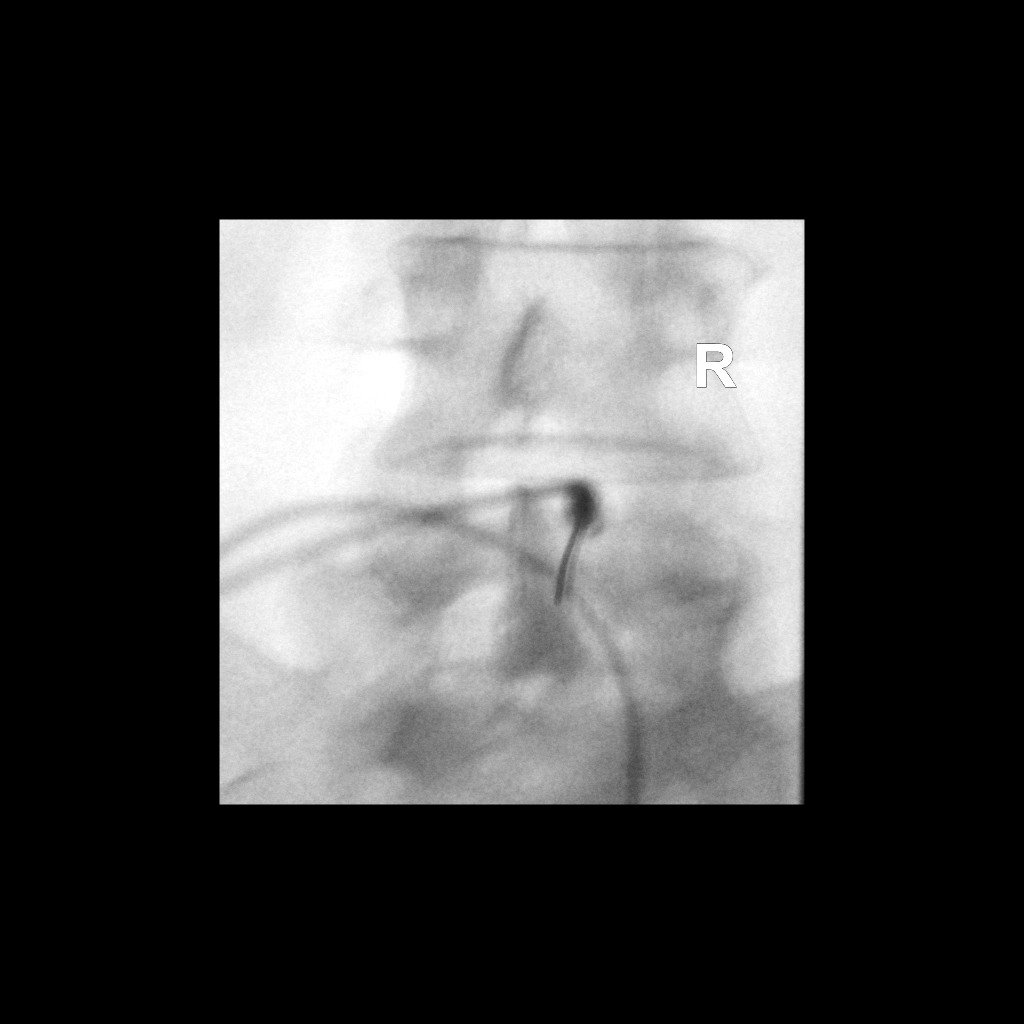

[1 of 1 positions shown; findings below may reference images not displayed]

EXAM:
LUMBAR EPIDURAL INJECTION UNDER FLUOROSCOPY

PROCEDURE:
The procedure, risks, benefits, and alternatives were explained to
the patient. Questions regarding the procedure were encouraged and
answered. The patient understands and consents to the procedure.

Operator donned sterile gloves and mask. The overlying skin was
cleansed and anesthetized. An interlaminar approach was performed on
the right at L5-S1. A 20 gauge Crawford epidural needle was advanced
using loss-of-resistance technique.

Injection of Omnipaque 180 shows a good epidural pattern with spread
above and below the level of needle placement. No intrathecal or
vascular opacification is seen.

120mg of Depo-Medrol mixed with 5ml lidocaine 1% were instilled. The
procedure was well-tolerated, and the patient was discharged thirty
minutes following the injection in good condition.

FLUOROSCOPY TIME:  20 seconds (38 u4ymV DAP)

COMPLICATIONS:
None
IMPRESSION: Technically successful epidural injection on the right at L5-S1.

## 2019-09-15 IMAGING — DX DG CERVICAL SPINE COMPLETE 4+V
6 series · 6 of 6 positions shown · non-contrast
Comparison: None.

CLINICAL DATA: Neck pain radiating to left upper extremity

EXAM:
CERVICAL SPINE - COMPLETE 4+ VIEW

[c-spine lat]
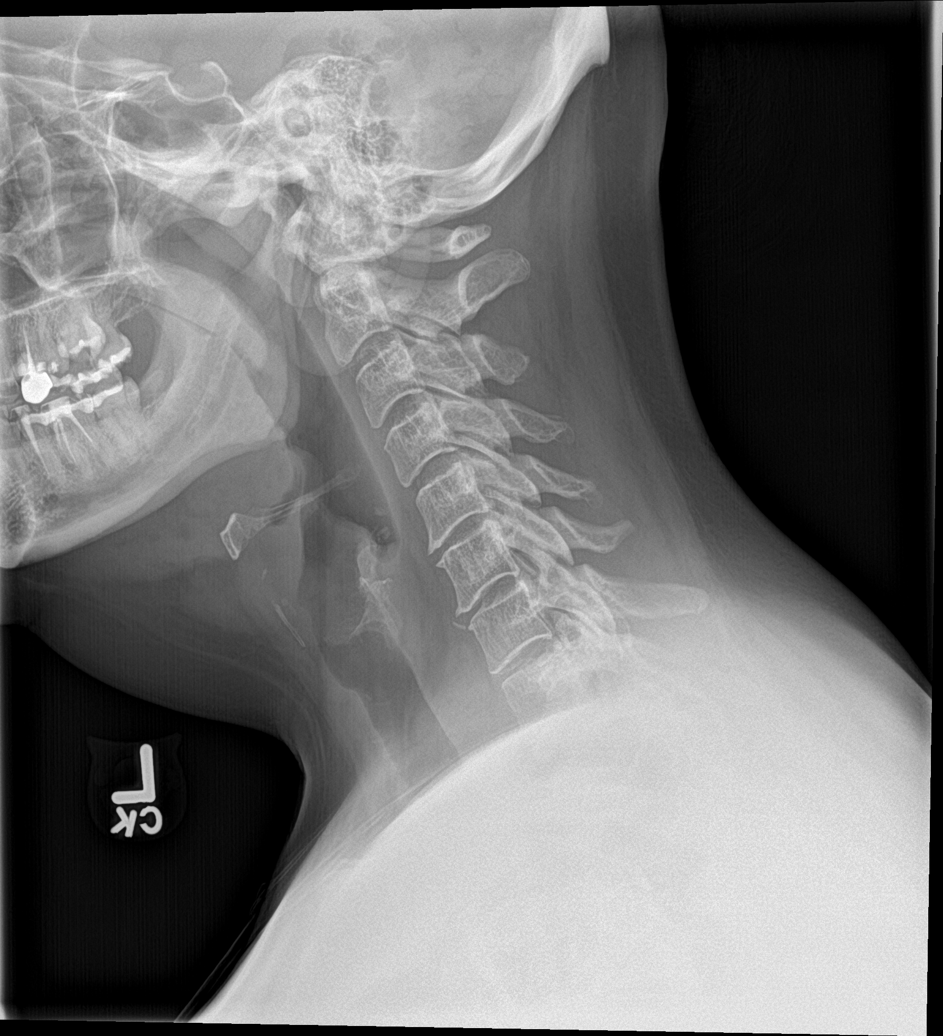

[c-spine obl (1 of 2)]
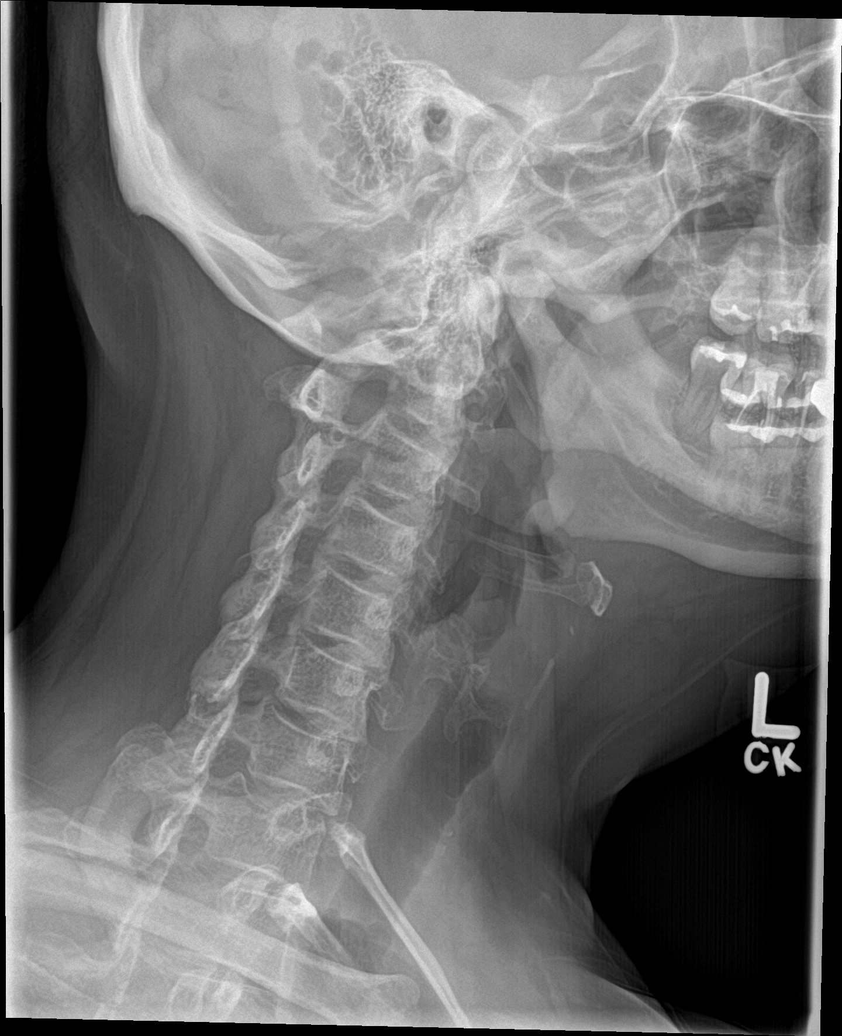

[c-spine obl (2 of 2)]
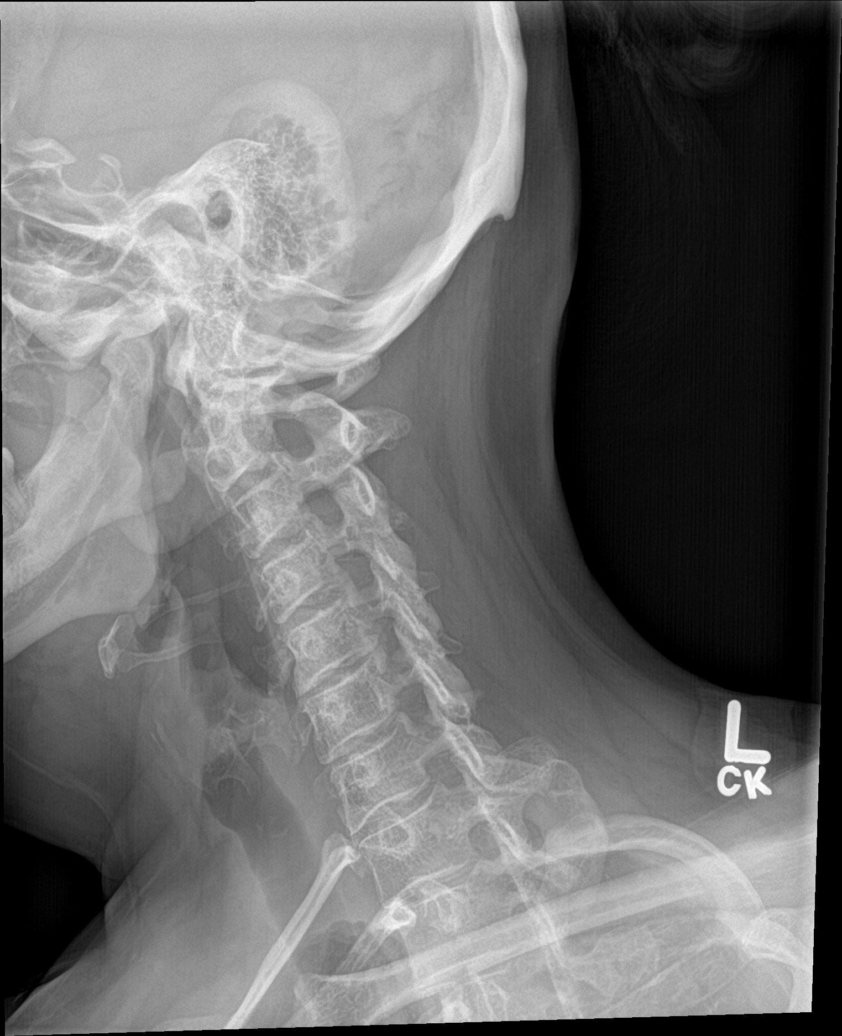

[c-spine ap]
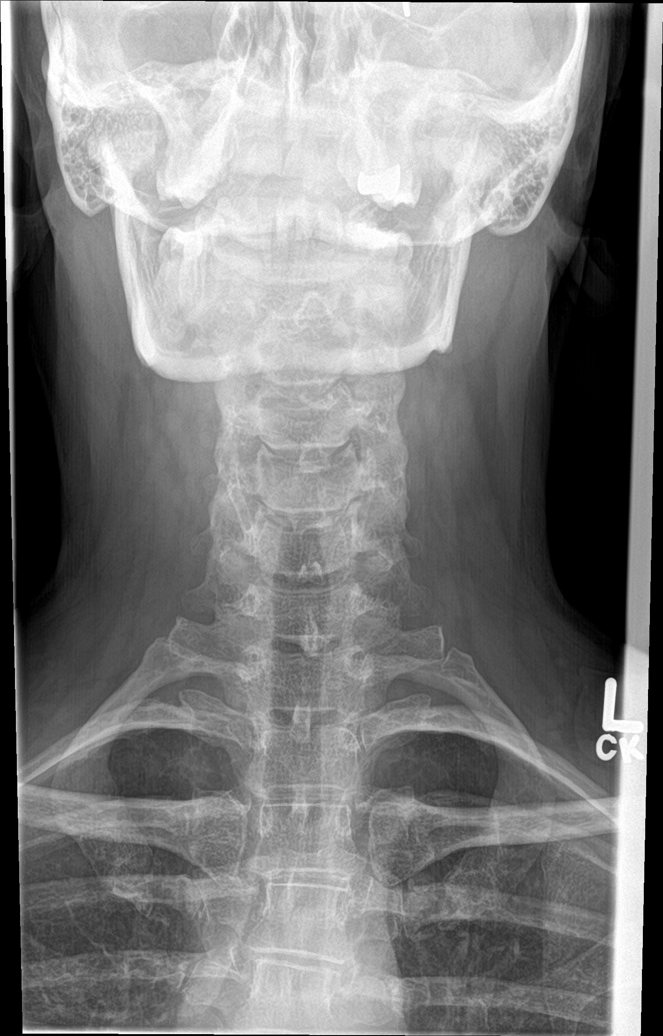

[c-spine open mouth]
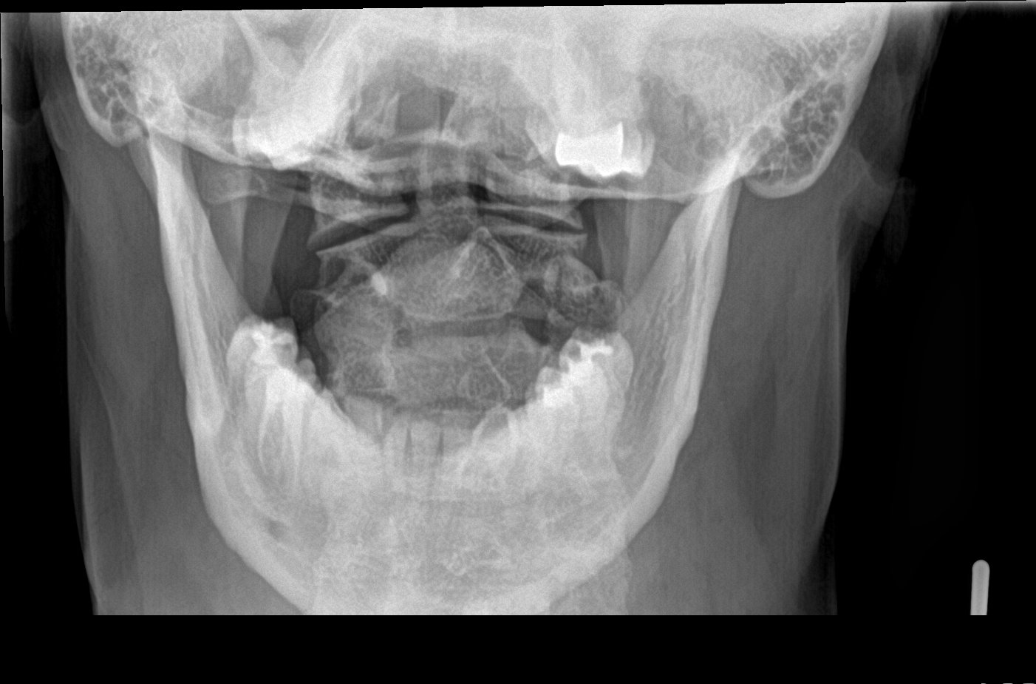

[[person_name]]
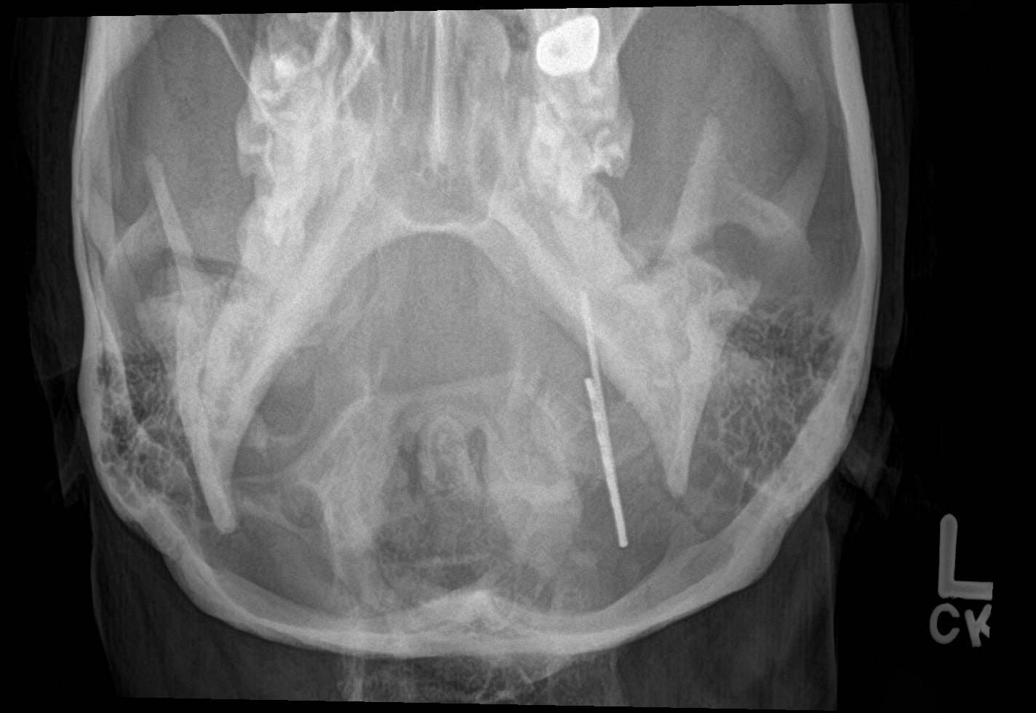

[6 of 6 positions shown; findings below may reference images not displayed]

FINDINGS: Early degenerative disc changes at C5-6 and C6-7 with disc space
narrowing and anterior spurring. Normal alignment. No fracture. No
neural foraminal narrowing. Prevertebral soft tissues are normal.
IMPRESSION: Early degenerative disc disease in the lower cervical spine. No
significant neural foraminal narrowing. No acute findings.

## 2020-04-30 ENCOUNTER — Other Ambulatory Visit: Payer: Self-pay

## 2020-04-30 ENCOUNTER — Encounter (HOSPITAL_COMMUNITY): Payer: Self-pay | Admitting: Psychiatric/Mental Health

## 2020-04-30 ENCOUNTER — Inpatient Hospital Stay (HOSPITAL_COMMUNITY)
Admission: RE | Admit: 2020-04-30 | Discharge: 2020-05-03 | DRG: 885 | Disposition: A | Discharge status: Home / Self Care | Payer: Federal, State, Local not specified - Other | Attending: Psychiatry | Admitting: Psychiatry

## 2020-04-30 DIAGNOSIS — R945 Abnormal results of liver function studies: Secondary | ICD-10-CM

## 2020-04-30 DIAGNOSIS — Z79899 Other long term (current) drug therapy: Secondary | ICD-10-CM | POA: Diagnosis not present

## 2020-04-30 DIAGNOSIS — Z87891 Personal history of nicotine dependence: Secondary | ICD-10-CM | POA: Diagnosis not present

## 2020-04-30 DIAGNOSIS — Z818 Family history of other mental and behavioral disorders: Secondary | ICD-10-CM | POA: Diagnosis not present

## 2020-04-30 DIAGNOSIS — F332 Major depressive disorder, recurrent severe without psychotic features: Secondary | ICD-10-CM | POA: Diagnosis present

## 2020-04-30 DIAGNOSIS — R45850 Homicidal ideations: Secondary | ICD-10-CM | POA: Diagnosis present

## 2020-04-30 DIAGNOSIS — Z20822 Contact with and (suspected) exposure to covid-19: Secondary | ICD-10-CM | POA: Diagnosis present

## 2020-04-30 DIAGNOSIS — R45851 Suicidal ideations: Secondary | ICD-10-CM | POA: Diagnosis present

## 2020-04-30 DIAGNOSIS — R7989 Other specified abnormal findings of blood chemistry: Secondary | ICD-10-CM

## 2020-04-30 DIAGNOSIS — F419 Anxiety disorder, unspecified: Secondary | ICD-10-CM | POA: Diagnosis present

## 2020-04-30 LAB — ETHANOL: Alcohol, Ethyl (B): <10 mg/dL (ref <10)

## 2020-04-30 LAB — SARS CORONAVIRUS 2 BY RT PCR (HOSPITAL ORDER, PERFORMED IN ~~LOC~~ HOSPITAL LAB): SARS Coronavirus 2: NEGATIVE

## 2020-04-30 LAB — COMPREHENSIVE METABOLIC PANEL
ALT: 62 U/L — ABNORMAL HIGH (ref 0–44)
AST: 61 U/L — ABNORMAL HIGH (ref 15–41)
Albumin: 3 g/dL — ABNORMAL LOW (ref 3.5–5.0)
Alkaline Phosphatase: 124 U/L (ref 38–126)
Anion gap: 8 (ref 5–15)
BUN: 12 mg/dL (ref 6–20)
CO2: 25 mmol/L (ref 22–32)
Calcium: 8.1 mg/dL — ABNORMAL LOW (ref 8.9–10.3)
Chloride: 106 mmol/L (ref 98–111)
Creatinine, Ser: 0.8 mg/dL (ref 0.44–1.00)
GFR calc Af Amer: >60 mL/min (ref >60)
GFR calc non Af Amer: >60 mL/min (ref >60)
Glucose, Bld: 167 mg/dL — ABNORMAL HIGH (ref 70–99)
Potassium: 4.2 mmol/L (ref 3.5–5.1)
Sodium: 139 mmol/L (ref 135–145)
Total Bilirubin: 0.3 mg/dL (ref 0.3–1.2)
Total Protein: 5.9 g/dL — ABNORMAL LOW (ref 6.5–8.1)

## 2020-04-30 LAB — HEMOGLOBIN A1C
Hgb A1c MFr Bld: 4.8 % (ref 4.8–5.6)
Mean Plasma Glucose: 91.06 mg/dL

## 2020-04-30 LAB — LIPID PANEL
Cholesterol: 117 mg/dL (ref 0–200)
HDL: 20 mg/dL — ABNORMAL LOW (ref >40)
LDL Cholesterol: 64 mg/dL (ref 0–99)
Total CHOL/HDL Ratio: 5.9 RATIO
Triglycerides: 163 mg/dL — ABNORMAL HIGH (ref <150)
VLDL: 33 mg/dL (ref 0–40)

## 2020-04-30 LAB — TSH: TSH: 3.914 u[IU]/mL (ref 0.350–4.500)

## 2020-04-30 MED ORDER — METHOCARBAMOL 750 MG PO TABS
750.0000 mg | ORAL_TABLET | Freq: Every day | ORAL | Status: DC | PRN
  Filled 2020-04-30: qty 4

## 2020-04-30 MED ORDER — LORAZEPAM 1 MG PO TABS: 1.0000 mg | ORAL_TABLET | Freq: Four times a day (QID) | ORAL | Status: DC | PRN

## 2020-04-30 MED ORDER — LAMOTRIGINE 200 MG PO TABS
200.0000 mg | ORAL_TABLET | Freq: Every day | ORAL | Status: DC
  Administered 2020-04-30 – 2020-05-01 (×2): 200 mg via ORAL
  Filled 2020-04-30: qty 1
  Filled 2020-04-30: qty 2
  Filled 2020-04-30: qty 1
  Filled 2020-04-30: qty 2
  Filled 2020-04-30 (×3): qty 1

## 2020-04-30 MED ORDER — PANTOPRAZOLE SODIUM 40 MG PO TBEC
40.0000 mg | DELAYED_RELEASE_TABLET | Freq: Every day | ORAL | Status: DC
  Filled 2020-04-30 (×6): qty 1

## 2020-04-30 MED ORDER — ALUM & MAG HYDROXIDE-SIMETH 200-200-20 MG/5ML PO SUSP
30.0000 mL | ORAL | Status: DC | PRN
Start: 2020-04-30 — End: 2020-05-03

## 2020-04-30 MED ORDER — HYDROXYZINE HCL 25 MG PO TABS
25.0000 mg | ORAL_TABLET | Freq: Three times a day (TID) | ORAL | Status: DC | PRN
  Filled 2020-04-30: qty 10
  Filled 2020-04-30: qty 1

## 2020-04-30 MED ORDER — ONDANSETRON 4 MG PO TBDP: 4.0000 mg | ORAL_TABLET | Freq: Four times a day (QID) | ORAL | Status: DC | PRN

## 2020-04-30 MED ORDER — NAPROXEN 250 MG PO TABS: 250.0000 mg | ORAL_TABLET | Freq: Two times a day (BID) | ORAL | Status: DC | PRN

## 2020-04-30 MED ORDER — LORAZEPAM 1 MG PO TABS: 1.0000 mg | ORAL_TABLET | ORAL | Status: DC | PRN

## 2020-04-30 MED ORDER — TRAZODONE HCL 50 MG PO TABS
50.0000 mg | ORAL_TABLET | Freq: Every evening | ORAL | Status: DC | PRN
  Filled 2020-04-30: qty 7
  Filled 2020-04-30: qty 1

## 2020-04-30 MED ORDER — MAGNESIUM HYDROXIDE 400 MG/5ML PO SUSP: 30.0000 mL | Freq: Every day | ORAL | Status: DC | PRN

## 2020-04-30 MED ORDER — VENLAFAXINE HCL ER 75 MG PO CP24
225.0000 mg | ORAL_CAPSULE | Freq: Every day | ORAL | Status: DC
  Administered 2020-04-30 – 2020-05-01 (×2): 225 mg via ORAL
  Filled 2020-04-30 (×6): qty 1

## 2020-04-30 MED ORDER — ACYCLOVIR 200 MG PO CAPS
400.0000 mg | ORAL_CAPSULE | Freq: Every day | ORAL | Status: DC
  Filled 2020-04-30 (×6): qty 2

## 2020-04-30 MED ORDER — ZIPRASIDONE HCL 20 MG PO CAPS
20.0000 mg | ORAL_CAPSULE | Freq: Two times a day (BID) | ORAL | Status: DC
  Administered 2020-05-01 – 2020-05-02 (×2): 20 mg via ORAL
  Filled 2020-04-30 (×10): qty 1

## 2020-04-30 MED ORDER — ZIPRASIDONE MESYLATE 20 MG IM SOLR: 20.0000 mg | INTRAMUSCULAR | Status: DC | PRN

## 2020-04-30 MED ORDER — LOPERAMIDE HCL 2 MG PO CAPS: 2.0000 mg | ORAL_CAPSULE | ORAL | Status: DC | PRN

## 2020-04-30 MED ORDER — ADULT MULTIVITAMIN W/MINERALS CH
1.0000 | ORAL_TABLET | Freq: Every day | ORAL | Status: DC
  Administered 2020-04-30 – 2020-05-01 (×2): 1 via ORAL
  Filled 2020-04-30 (×7): qty 1

## 2020-04-30 MED ORDER — HYDROXYZINE HCL 25 MG PO TABS: 25.0000 mg | ORAL_TABLET | Freq: Four times a day (QID) | ORAL | Status: DC | PRN

## 2020-04-30 MED ORDER — ACETAMINOPHEN 325 MG PO TABS: 650.0000 mg | ORAL_TABLET | Freq: Four times a day (QID) | ORAL | Status: DC | PRN

## 2020-04-30 MED ORDER — OLANZAPINE 5 MG PO TBDP: 5.0000 mg | ORAL_TABLET | Freq: Three times a day (TID) | ORAL | Status: DC | PRN
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
## 2020-04-30 NOTE — Progress Notes (Signed)
The patient's positive event for the day is that she "woke up and came here" for help. Her goal for tomorrow is to work on her coping skills.
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
## 2020-04-30 NOTE — Tx Team (Signed)
Initial Treatment Plan 04/30/2020 5:35 PM MACKINZIE VUNCANNON PPG:[id number]    PATIENT STRESSORS: Financial difficulties Legal issue   PATIENT STRENGTHS: Ability for insight Capable of independent living Communication skills Supportive family/friends   PATIENT IDENTIFIED PROBLEMS: SI  anxiety  depression  Custody                DISCHARGE CRITERIA:  Ability to meet basic life and health needs Adequate post-discharge living arrangements Improved stabilization in mood, thinking, and/or behavior  PRELIMINARY DISCHARGE PLAN: Outpatient therapy Return to previous living arrangement  PATIENT/FAMILY INVOLVEMENT: This treatment plan has been presented to and reviewed with the patient, CHAZ RONNING . The patient and family have been given the opportunity to ask questions and make suggestions.  Roxanna Mew, RN 04/30/2020, 5:35 PM

## 2020-05-01 ENCOUNTER — Inpatient Hospital Stay (HOSPITAL_COMMUNITY): Payer: BLUE CROSS/BLUE SHIELD

## 2020-05-01 DIAGNOSIS — F332 Major depressive disorder, recurrent severe without psychotic features: Principal | ICD-10-CM

## 2020-05-01 LAB — CBC
HCT: 42.7 % (ref 36.0–46.0)
Hemoglobin: 13.6 g/dL (ref 12.0–15.0)
MCH: 29.3 pg (ref 26.0–34.0)
MCHC: 31.9 g/dL (ref 30.0–36.0)
MCV: 92 fL (ref 80.0–100.0)
Platelets: 158 10*3/uL (ref 150–400)
RBC: 4.64 MIL/uL (ref 3.87–5.11)
RDW: 15 % (ref 11.5–15.5)
WBC: 15.2 10*3/uL — ABNORMAL HIGH (ref 4.0–10.5)
nRBC: 0.2 % (ref 0.0–0.2)

## 2020-05-01 LAB — URINALYSIS, ROUTINE W REFLEX MICROSCOPIC
Bilirubin Urine: NEGATIVE
Glucose, UA: NEGATIVE mg/dL
Hgb urine dipstick: NEGATIVE
Ketones, ur: NEGATIVE mg/dL
Leukocytes,Ua: NEGATIVE
Nitrite: NEGATIVE
Protein, ur: NEGATIVE mg/dL
Specific Gravity, Urine: 1.016 (ref 1.005–1.030)
pH: 7 (ref 5.0–8.0)

## 2020-05-01 LAB — RAPID URINE DRUG SCREEN, HOSP PERFORMED
Amphetamines: NOT DETECTED
Barbiturates: NOT DETECTED
Benzodiazepines: NOT DETECTED
Cocaine: NOT DETECTED
Opiates: NOT DETECTED
Tetrahydrocannabinol: POSITIVE — AB

## 2020-05-01 LAB — PREGNANCY, URINE: Preg Test, Ur: NEGATIVE

## 2020-05-01 NOTE — Progress Notes (Signed)
Patient shared in group that she had good conversations with her peers today and enjoyed the groups. Her goal for tomorrow is to take a shower.
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
## 2020-05-01 NOTE — [facility] Group Notes (Signed)
Occupational Therapy Group Note Date: 05/01/2020 Group Topic/Focus: Stress Management  Group Description: Group encouraged increased participation and engagement through discussion focused on topic of stress management. Patients engaged interactively to discuss components of stress including physical signs, emotional signs, negative management strategies, and positive management strategies. Each individual identified one new stress management strategy they would like to try moving forward.   Participation Level: Active   Participation Quality: Independent   Behavior: Cooperative and Interactive   Speech/Thought Process: Focused   Affect/Mood: Full range   Insight: Fair   Judgement: Fair   Individualization: Gina James was active and independent in her participation of discussion. She shared that when she is stressed she yells and has "extreme outbursts." She explained her anger with use of a volcano analogy and noted that she can never get the lava to just simmer and it is "always exploding." Pt offered and talked out several strategies to manage anger and stress before the outburst, which pt appeared somewhat receptive to. Pt identified strong interest in "punching things" and noted benefit of kickboxing in the past.   Modes of Intervention: Discussion and Education  Patient Response to Interventions:  Attentive, Engaged, Receptive and Interested   Plan: Continue to engage patient in OT groups 2 - 3x/week.  05/01/2020  Ponciano Ort, MOT, OTR/L

## 2020-05-01 NOTE — Progress Notes (Signed)
DENISE AT [facility] AT [facility] MADE APPOINTMENT AT 11:30 A.M. FOR THIS PATIENT TO HAVE ABD [place].    PATIENT IS TO GO TO [facility] BEFORE 11:30 Friday 8/27 FOR ULTRA SOUND.  CHARGE NURSE AND AKEYSHA ARE AWARE OF THIS APPOINTMENT.  DENISE, [place], PHONE [phone]  IF NEEDED.  PATIENT IS TO BE NPO 8 HRS PREVIOUS.  PATIENT IS AWARE THAT SHE IS NOT TO EAT/DRINK ANYTHING AFTER 3:00 A.M. Friday A.M. 05/02/2020.
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
## 2020-05-01 NOTE — Progress Notes (Addendum)
D:  Patient's self inventory sheet, patient sleeps good, no sleep medication.  Good appetite, low energy level, poor concentration.  Rated depression and anxiety 9, hopeless 10.  Denied withdrawals.  Denied SI.  Denied physical problems.  Denied physical pain.  Goal is new coping skills.  Plans to attend class.  Needs to have routine. A:  Patient stated she takes her medications at night that are prescribed by her MD.  Emotional support and encouragement given patient. R:  Denied SI and HI, contracts for safety.  Denied A/V hallucinations.  Safety maintained with 15 minute checks.

## 2020-05-01 NOTE — Progress Notes (Signed)
   04/30/20 2045  COVID-19 Daily Checkoff  Have you had a fever (temp > 37.80C/100F)  in the past 24 hours?  No  COVID-19 EXPOSURE  Have you traveled outside the state in the past 14 days? No  Have you been in contact with someone with a confirmed diagnosis of COVID-19 or PUI in the past 14 days without wearing appropriate PPE? No  Have you been living in the same home as a person with confirmed diagnosis of COVID-19 or a PUI (household contact)? No  Have you been diagnosed with COVID-19? No

## 2020-05-01 NOTE — Progress Notes (Signed)
Gina James from [place] called.  This patient is to be NPO until she comes at approximately 6:00 to 6:30 p.m. to do the [place].  Patient informed not to eat/drink until after [place].

## 2020-05-02 ENCOUNTER — Ambulatory Visit (HOSPITAL_COMMUNITY): Payer: Medicaid Other | Attending: Psychiatry

## 2020-05-02 DIAGNOSIS — R945 Abnormal results of liver function studies: Secondary | ICD-10-CM | POA: Insufficient documentation

## 2020-05-02 MED ORDER — ADULT MULTIVITAMIN W/MINERALS CH
1.0000 | ORAL_TABLET | Freq: Every day | ORAL | Status: DC
  Administered 2020-05-02: 1 via ORAL
  Filled 2020-05-02 (×3): qty 1

## 2020-05-02 MED ORDER — VENLAFAXINE HCL ER 75 MG PO CP24
225.0000 mg | ORAL_CAPSULE | Freq: Every day | ORAL | Status: DC
  Administered 2020-05-02: 225 mg via ORAL
  Filled 2020-05-02: qty 21
  Filled 2020-05-02 (×2): qty 3

## 2020-05-02 MED ORDER — ACYCLOVIR 200 MG PO CAPS
400.0000 mg | ORAL_CAPSULE | Freq: Every day | ORAL | Status: DC
  Administered 2020-05-02: 400 mg via ORAL
  Filled 2020-05-02: qty 14
  Filled 2020-05-02 (×2): qty 2

## 2020-05-02 MED ORDER — LAMOTRIGINE 100 MG PO TABS
200.0000 mg | ORAL_TABLET | Freq: Every day | ORAL | Status: DC
  Administered 2020-05-02: 200 mg via ORAL
  Filled 2020-05-02: qty 14
  Filled 2020-05-02 (×2): qty 2

## 2020-05-02 MED ORDER — PANTOPRAZOLE SODIUM 40 MG PO TBEC
40.0000 mg | DELAYED_RELEASE_TABLET | Freq: Every day | ORAL | Status: DC
  Filled 2020-05-02: qty 7
  Filled 2020-05-02 (×2): qty 1

## 2020-05-02 MED ORDER — ZIPRASIDONE HCL 40 MG PO CAPS
40.0000 mg | ORAL_CAPSULE | Freq: Two times a day (BID) | ORAL | Status: DC
  Administered 2020-05-02 – 2020-05-03 (×2): 40 mg via ORAL
  Filled 2020-05-02 (×6): qty 1

## 2020-05-02 NOTE — Progress Notes (Signed)
Adult Psychoeducational Group Note  Date:  05/02/2020 Time:  11:28 AM  Group Topic/Focus:  Goals Group:   The focus of this group is to help patients establish daily goals to achieve during treatment and discuss how the patient can incorporate goal setting into their daily lives to aide in recovery.  Participation Level:  Active  Participation Quality:  Appropriate  Affect:  Appropriate  Cognitive:  Appropriate  Insight: Appropriate  Engagement in Group:  Engaged  Modes of Intervention:  Discussion  Additional Comments:  Patient attended goal setting and Emotion Regulation group and participated.   Gina James 0/71/2524, 11:28 AM

## 2020-05-02 NOTE — Progress Notes (Signed)
   05/02/20 0855  Psych Admission Type (Psych Patients Only)  Admission Status Voluntary  Psychosocial Assessment  Patient Complaints Anxiety;Depression  Eye Contact Fair  Facial Expression Worried;Sad  Affect Depressed;Sad;Sullen  Speech Logical/coherent  Interaction Assertive  Motor Activity Other (Comment) (WNL)  Appearance/Hygiene Excess makeup;Unremarkable  Behavior Characteristics Cooperative  Mood Anxious;Depressed  Thought Process  Coherency WDL  Content WDL  Delusions None reported or observed  Perception WDL  Hallucination None reported or observed  Judgment Poor  Confusion None  Danger to Self  Current suicidal ideation? Denies  Self-Injurious Behavior No self-injurious ideation or behavior indicators observed or expressed   Agreement Not to Harm Self Yes  Description of Agreement verbally contracts for safety  Danger to Others  Danger to Others None reported or observed  Danger to Others Abnormal  Harmful Behavior to others No threats or harm toward other people  Destructive Behavior No threats or harm toward property

## 2020-05-02 NOTE — Progress Notes (Signed)
Cooperative with treatment , medication compliant, no issues to report on shift at this time.

## 2020-05-02 NOTE — Progress Notes (Signed)
Recreation Therapy Notes  Date:  8.27.21 Time: 0930 Location: [address] Dayroom  Group Topic: Stress Management  Goal Area(s) Addresses:  Patient will identify positive stress management techniques. Patient will identify benefits of using stress management post d/c.  Behavioral Response: Engaged  Intervention: Stress Management  Activity : Meditation.  LRT played a meditation that focused on letting go of the past and not letting it affect the present.  Patients were to listen and follow along as meditation played to engage in activity.    Education:  Stress Management, Discharge Planning.   Education Outcome: Acknowledges Education  Clinical Observations/Feedback: Pt attended and participated in group activity.    Victorino Sparrow, LRT/CTRS         Ria Comment, Liani Caris A 05/02/2020 10:06 AM

## 2020-05-02 NOTE — Progress Notes (Addendum)
[facility] MD Progress Note  05/02/2020 10:22 AM Gina James  MRN:  [id number] Subjective:  Pt is seen and examined today. Pt states her mood is better than yesterday. Pt rates her mood as 7/10 (10 is the best mood). She states her anxiety is much better today but she still feels anxious and rates it 5/10. She states this is the first time in long time when she woke up without panic attack in the morning. Pt slept well last night. Nursing notes indicate that Pt slept for 6 hours. Pt states her appetite is good. Currently, Pt denies any suicidal ideation, homicidal ideation and, visual and auditory hallucination. Pt denies any headache, nausea, vomiting, dizziness, chest pain, SOB, abdominal pain, diarrhea, and constipation. Pt denies any medication side effects and has been tolerating it well. Pt explained about her Misdemeanor charges in the past, states her Ex husband had a protection order and she entered the home when he was not at home and destroyed some of her stuff and pictures and made a mess there. Her Ex husband called the police and told them that she was being violent  and he is afraid of her. Pt states he lied to the police and she was not violent. Pt states she wants to change the lawyer but she doesn't have any money and she can't afford any other lawyer. She is thinking of going to the hearing alone without the lawyer and says that she has a lot of Evidence to prove that she is not crazy. We also talked about getting a job which will keep her busy and provide her insurance and  finacial support. We talked about talking to her family but she states she is not interested in that as her family is very toxic and would make things worse only. Pt states Latuda worked for her but she can't afford it. Pt wants to increase the dosage for Geodon. Pt was given the opportunity to ask any questions. Pt denies any concerns.  Pt is attending groups. On examination- Pt is cooperative and oriented x4. Pt's mood is  anxious and dysphoric and her affect is constricted. Pt speech is clear with normal volume. Pt is not responding to internal stimuli. Denies SI, HI and AVH.  Attempted to call Friend/roomate Larene Beach @ [phone]- No answer, Left message. CSW got in touch with her roommate- It appears that Pt freak out and break things. Pt is in custody agreement with her Ex and if she behaves well she will get more contact with her kids but apparently she was not behaving well.  Objective:Ptis an 39 y.o.femalewith reported history of bipolar II , Depression and Anxiety. She presented voluntarily for increased depression with SI/HI. She reports SI to fill the bathtub with water and put the hair dryer in. She reports generalized HI with thoughts of running people over with her car.   Principal Problem: <principal problem not specified> Diagnosis: Active Problems:   MDD (major depressive disorder), recurrent severe, without psychosis ([place])  Total Time spent with patient: 20 minutes  Past Psychiatric History: See H&P  Past Medical History:  Past Medical History:  Diagnosis Date   Ankle fracture, right    Anxiety    Arthritis    "in my back" (04/08/2015)   Chronic lower back pain    Complication of anesthesia    difficulty urinating after anesthesia   Headache    "monthly" (04/08/2015)   Post partum depression     Past Surgical History:  Procedure  Laterality Date   ANTERIOR FUSION LUMBAR SPINE  07/06/2017   BREAST SURGERY     CESAREAN SECTION  2007 and 2009   COMBINED ABDOMINOPLASTY AND LIPOSUCTION  12/2014   "liposuction in thighs"   [place]   "to check for endometriosis"   LAPAROSCOPIC CHOLECYSTECTOMY  2007   OPEN REDUCTION INTERNAL FIXATION (ORIF) TIBIA/FIBULA FRACTURE Right 04/08/2015   Procedure: OPEN REDUCTION INTERNAL FIXATION (ORIF)  RIGHT TIBIA PILON FRACTURE;  Surgeon: Wylene Simmer, MD;  Location: [place];  Service:  Orthopedics;  Laterality: Right;   TIBIA FRACTURE SURGERY Right 04/08/2015   ORIF tibial pilon   Family History:  Family History  Problem Relation Age of Onset   Diabetes Father    Cancer Sister 57       breast, had chemo/double mastectomy   Heart disease Maternal Grandfather    Heart disease Paternal Grandfather    Family Psychiatric  History: See H&P Social History:  Social History   Substance and Sexual Activity  Alcohol Use Not Currently   Alcohol/week: 4.0 standard drinks   Types: 4 Cans of beer per week     Social History   Substance and Sexual Activity  Drug Use Yes   Types: Marijuana    Social History   Socioeconomic History   Marital status: Divorced    Spouse name: Not on file   Number of children: Not on file   Years of education: Not on file   Highest education level: Not on file  Occupational History   Not on file  Tobacco Use   Smoking status: Former Smoker    Packs/day: 0.12    Years: 10.00    Pack years: 1.20    Types: Cigarettes    Quit date: 09/06/2004    Years since quitting: 15.6   Smokeless tobacco: Never Used  Scientific laboratory technician Use: Never used  Substance and Sexual Activity   Alcohol use: Not Currently    Alcohol/week: 4.0 standard drinks    Types: 4 Cans of beer per week   Drug use: Yes    Types: Marijuana   Sexual activity: Not Currently    Comment: vasectomy in partner  Other Topics Concern   Not on file  Social History Narrative   2 children- son 2007- Sean,  Taryn 2009   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       Social Determinants of Health   Financial Resource Strain:    Difficulty of Paying Living Expenses: Not on file  Food Insecurity:    Worried About Charity fundraiser in the Last Year: Not on file   [employer] of Food in the Last Year: Not on file  Transportation Needs:    Lack of  Transportation (Medical): Not on file   Lack of Transportation (Non-Medical): Not on file  Physical Activity:    Days of Exercise per Week: Not on file   Minutes of Exercise per Session: Not on file  Stress:    Feeling of Stress : Not on file  Social Connections:    Frequency of Communication with Friends and Family: Not on file   Frequency of Social Gatherings with Friends and Family: Not on file   Attends Religious Services: Not on file   Active Member of Clubs or Organizations: Not on file  Attends Archivist Meetings: Not on file   Marital Status: Not on file   Additional Social History:    Pain Medications: None reported Prescriptions: States she is still taking meds via [name] psychiatrist Effexor, Lamotrigine, Ativan 1 mg PRN & Latuda (pt states she stopped bc she can't afford it but it had helped her) Over the Counter: Not reported History of alcohol / drug use?: No history of alcohol / drug abuse Longest period of sobriety (when/how long): n/a                    Sleep: Good  Appetite:  Good  Current Medications: Current Facility-Administered Medications  Medication Dose Route Frequency Provider Last Rate Last Admin   acetaminophen (TYLENOL) tablet 650 mg  650 mg Oral Q6H PRN Sharma Covert, MD       acyclovir (ZOVIRAX) 200 MG capsule 400 mg  400 mg Oral Daily Sharma Covert, MD       alum & mag hydroxide-simeth (MAALOX/MYLANTA) 200-200-20 MG/5ML suspension 30 mL  30 mL Oral Q4H PRN Sharma Covert, MD       hydrOXYzine (ATARAX/VISTARIL) tablet 25 mg  25 mg Oral TID PRN Sharma Covert, MD       lamoTRIgine (LAMICTAL) tablet 200 mg  200 mg Oral Daily Sharma Covert, MD   200 mg at 05/01/20 2159   loperamide (IMODIUM) capsule 2-4 mg  2-4 mg Oral PRN Sharma Covert, MD       LORazepam (ATIVAN) tablet 1 mg  1 mg Oral Q6H PRN Sharma Covert, MD       OLANZapine zydis (ZYPREXA) disintegrating tablet 5 mg  5 mg  Oral Q8H PRN Sharma Covert, MD       And   LORazepam (ATIVAN) tablet 1 mg  1 mg Oral PRN Sharma Covert, MD       And   ziprasidone (GEODON) injection 20 mg  20 mg Intramuscular PRN Sharma Covert, MD       magnesium hydroxide (MILK OF MAGNESIA) suspension 30 mL  30 mL Oral Daily PRN Sharma Covert, MD       methocarbamol (ROBAXIN) tablet 750 mg  750 mg Oral Daily PRN Sharma Covert, MD       multivitamin with minerals tablet 1 tablet  1 tablet Oral Daily Sharma Covert, MD   1 tablet at 05/01/20 2159   naproxen (NAPROSYN) tablet 250 mg  250 mg Oral BID PRN Sharma Covert, MD       ondansetron (ZOFRAN-ODT) disintegrating tablet 4 mg  4 mg Oral Q6H PRN Sharma Covert, MD       pantoprazole (PROTONIX) EC tablet 40 mg  40 mg Oral Daily Sharma Covert, MD       traZODone (DESYREL) tablet 50 mg  50 mg Oral QHS PRN Sharma Covert, MD       venlafaxine XR (EFFEXOR-XR) 24 hr capsule 225 mg  225 mg Oral Q breakfast Sharma Covert, MD   225 mg at 05/01/20 2158   ziprasidone (GEODON) capsule 20 mg  20 mg Oral BID WC Sharma Covert, MD   20 mg at 05/01/20 2159    Lab Results:  Results for orders placed or performed during the hospital encounter of 04/30/20 (from the past 48 hour(s))  SARS Coronavirus 2 by RT PCR (hospital order, performed in [facility] hospital lab) Nasopharyngeal Nasopharyngeal Swab     Status:  None   Collection Time: 04/30/20 12:44 PM   Specimen: Nasopharyngeal Swab  Result Value Ref Range   SARS Coronavirus 2 NEGATIVE NEGATIVE    Comment: (NOTE) SARS-CoV-2 target nucleic acids are NOT DETECTED.  The SARS-CoV-2 RNA is generally detectable in upper and lower respiratory specimens during the acute phase of infection. The lowest concentration of SARS-CoV-2 viral copies this assay can detect is 250 copies / mL. A negative result does not preclude SARS-CoV-2 infection and should not be used as the sole basis for treatment or  other patient management decisions.  A negative result may occur with improper specimen collection / handling, submission of specimen other than nasopharyngeal swab, presence of viral mutation(s) within the areas targeted by this assay, and inadequate number of viral copies (<250 copies / mL). A negative result must be combined with clinical observations, patient history, and epidemiological information.  Fact Sheet for Patients:   StrictlyIdeas.no  Fact Sheet for Healthcare Providers: BankingDealers.co.za  This test is not yet approved or  cleared by the [place] FDA and has been authorized for detection and/or diagnosis of SARS-CoV-2 by FDA under an Emergency Use Authorization (EUA).  This EUA will remain in effect (meaning this test can be used) for the duration of the COVID-19 declaration under Section 564(b)(1) of the Act, 21 U.S.C. section 360bbb-3(b)(1), unless the authorization is terminated or revoked sooner.  Performed at [facility], [place] [address], [place], [place] [address]   Ethanol     Status: None   Collection Time: 04/30/20  5:56 PM  Result Value Ref Range   Alcohol, Ethyl (B) <10 <10 mg/dL    Comment: (NOTE) Lowest detectable limit for serum alcohol is 10 mg/dL.  For medical purposes only. Performed at [facility], [place] [address] [place], [place] [address]   Hemoglobin A1c     Status: None   Collection Time: 04/30/20  5:56 PM  Result Value Ref Range   Hgb A1c MFr Bld 4.8 4.8 - 5.6 %    Comment: (NOTE) Pre diabetes:          5.7%-6.4%  Diabetes:              >6.4%  Glycemic control for   <7.0% adults with diabetes    Mean Plasma Glucose 91.06 mg/dL    Comment: Performed at [facility] [address], [place], [place] [address]  Lipid panel     Status: Abnormal   Collection Time: 04/30/20  5:56 PM  Result Value Ref Range   Cholesterol 117 0 - 200 mg/dL    Triglycerides 163 (H) <150 mg/dL   HDL 20 (L) >40 mg/dL   Total CHOL/HDL Ratio 5.9 RATIO   VLDL 33 0 - 40 mg/dL   LDL Cholesterol 64 0 - 99 mg/dL    Comment:        Total Cholesterol/HDL:CHD Risk Coronary Heart Disease Risk Table                     Men   Women  1/2 Average Risk   3.4   3.3  Average Risk       5.0   4.4  2 X Average Risk   9.6   7.1  3 X Average Risk  23.4   11.0        Use the calculated Patient Ratio above and the CHD Risk Table to determine the patient's CHD Risk.  ATP III CLASSIFICATION (LDL):  <100     mg/dL   Optimal  100-129  mg/dL   Near or Above                    Optimal  130-159  mg/dL   Borderline  160-189  mg/dL   High  >190     mg/dL   Very High Performed at [facility] [address] [place], [place] [address]   TSH     Status: None   Collection Time: 04/30/20  5:56 PM  Result Value Ref Range   TSH 3.914 0.350 - 4.500 uIU/mL    Comment: Performed by a 3rd Generation assay with a functional sensitivity of <=0.01 uIU/mL. Performed at [facility], [place] [address], [place], [place] [address]   Comprehensive metabolic panel     Status: Abnormal   Collection Time: 04/30/20  5:56 PM  Result Value Ref Range   Sodium 139 135 - 145 mmol/L   Potassium 4.2 3.5 - 5.1 mmol/L   Chloride 106 98 - 111 mmol/L   CO2 25 22 - 32 mmol/L   Glucose, Bld 167 (H) 70 - 99 mg/dL    Comment: Glucose reference range applies only to samples taken after fasting for at least 8 hours.   BUN 12 6 - 20 mg/dL   Creatinine, Ser 0.80 0.44 - 1.00 mg/dL   Calcium 8.1 (L) 8.9 - 10.3 mg/dL   Total Protein 5.9 (L) 6.5 - 8.1 g/dL   Albumin 3.0 (L) 3.5 - 5.0 g/dL   AST 61 (H) 15 - 41 U/L   ALT 62 (H) 0 - 44 U/L   Alkaline Phosphatase 124 38 - 126 U/L   Total Bilirubin 0.3 0.3 - 1.2 mg/dL   GFR calc non Af Amer >60 >60 mL/min   GFR calc Af Amer >60 >60 mL/min   Anion gap 8 5 - 15    Comment: Performed at [facility], [place] [address], [place], [place] [address]  CBC     Status: Abnormal   Collection Time: 05/01/20  6:35 AM  Result Value Ref Range   WBC 15.2 (H) 4.0 - 10.5 K/uL   RBC 4.64 3.87 - 5.11 MIL/uL   Hemoglobin 13.6 12.0 - 15.0 g/dL   HCT 42.7 36 - 46 %   MCV 92.0 80.0 - 100.0 fL   MCH 29.3 26.0 - 34.0 pg   MCHC 31.9 30.0 - 36.0 g/dL   RDW 15.0 11.5 - 15.5 %   Platelets 158 150 - 400 K/uL   nRBC 0.2 0.0 - 0.2 %    Comment: Performed at [facility], [place] [address] [place], [place] [address]  Urinalysis, Routine w reflex microscopic Urine, Clean Catch     Status: None   Collection Time: 05/01/20  6:32 PM  Result Value Ref Range   Color, Urine YELLOW YELLOW   APPearance CLEAR CLEAR   Specific Gravity, Urine 1.016 1.005 - 1.030   pH 7.0 5.0 - 8.0   Glucose, UA NEGATIVE NEGATIVE mg/dL   Hgb urine dipstick NEGATIVE NEGATIVE   Bilirubin Urine NEGATIVE NEGATIVE   Ketones, ur NEGATIVE NEGATIVE mg/dL   Protein, ur NEGATIVE NEGATIVE mg/dL   Nitrite NEGATIVE NEGATIVE   Leukocytes,Ua NEGATIVE NEGATIVE    Comment: Performed at [facility] [address], [place], [place] [address]  Pregnancy, urine     Status: None   Collection Time:  05/01/20  6:32 PM  Result Value Ref Range   Preg Test, Ur NEGATIVE NEGATIVE    Comment: Performed at [facility], [place] [address], [place], [place] [address]  Urine rapid drug screen (hosp performed)not at [facility]     Status: Abnormal   Collection Time: 05/01/20  6:32 PM  Result Value Ref Range   Opiates NONE DETECTED NONE DETECTED   Cocaine NONE DETECTED NONE DETECTED   Benzodiazepines NONE DETECTED NONE DETECTED   Amphetamines NONE DETECTED NONE DETECTED   Tetrahydrocannabinol POSITIVE (A) NONE DETECTED   Barbiturates NONE DETECTED NONE DETECTED    Comment: (NOTE) DRUG SCREEN FOR MEDICAL PURPOSES ONLY.  IF CONFIRMATION IS NEEDED FOR ANY PURPOSE, NOTIFY LAB WITHIN 5 DAYS.  LOWEST  DETECTABLE LIMITS FOR URINE DRUG SCREEN Drug Class                     Cutoff (ng/mL) Amphetamine and metabolites    1000 Barbiturate and metabolites    200 Benzodiazepine                 440 Tricyclics and metabolites     300 Opiates and metabolites        300 Cocaine and metabolites        300 THC                            50 Performed at [facility], [place] [address] [place], [place] [address]     Blood Alcohol level:  Lab Results  Component Value Date   ETH <10 04/30/2020   ETH <10 53/66/4403    Metabolic Disorder Labs: Lab Results  Component Value Date   HGBA1C 4.8 04/30/2020   MPG 91.06 04/30/2020   MPG 93.93 11/05/2017   No results found for: PROLACTIN Lab Results  Component Value Date   CHOL 117 04/30/2020   TRIG 163 (H) 04/30/2020   HDL 20 (L) 04/30/2020   CHOLHDL 5.9 04/30/2020   VLDL 33 04/30/2020   LDLCALC 64 04/30/2020   LDLCALC 150 (H) 06/20/2018    Physical Findings: AIMS: Facial and Oral Movements Muscles of Facial Expression: None, normal Lips and Perioral Area: None, normal Jaw: None, normal Tongue: None, normal,Extremity Movements Upper (arms, wrists, hands, fingers): None, normal Lower (legs, knees, ankles, toes): None, normal, Trunk Movements Neck, shoulders, hips: None, normal, Overall Severity Severity of abnormal movements (highest score from questions above): None, normal Incapacitation due to abnormal movements: None, normal Patient's awareness of abnormal movements (rate only patient's report): No Awareness, Dental Status Current problems with teeth and/or dentures?: No Does patient usually wear dentures?: No  CIWA:  CIWA-Ar Total: 0 COWS:     Musculoskeletal: Strength & Muscle Tone: within normal limits Gait & Station: normal Patient leans: N/A  Psychiatric Specialty Exam: Physical Exam Vitals and nursing note reviewed.  Constitutional:      General: She is not in acute distress.    Appearance: Normal  appearance. She is not ill-appearing, toxic-appearing or diaphoretic.  HENT:     Head: Normocephalic and atraumatic.  Pulmonary:     Effort: Pulmonary effort is normal.  Neurological:     General: No focal deficit present.     Mental Status: She is alert.     Review of Systems  Constitutional: Negative for activity change, appetite change and fever.  Eyes: Negative for visual disturbance.  Respiratory: Negative for chest tightness and shortness of  breath.   Cardiovascular: Negative for chest pain.  Gastrointestinal: Negative for abdominal pain, constipation, diarrhea, nausea and vomiting.  Neurological: Negative for dizziness and headaches.  Psychiatric/Behavioral: Positive for dysphoric mood. Negative for agitation, hallucinations and sleep disturbance. The patient is nervous/anxious.     Blood pressure 100/74, pulse (!) 128, temperature 98.5 F (36.9 C), temperature source Oral, resp. rate 18, height 5\' 5"  (1.651 m), weight 90.7 kg, SpO2 97 %.Body mass index is 33.28 kg/m.  General Appearance: Fairly Groomed  Eye Contact:  Good  Speech:  Clear and Coherent  Volume:  Normal  Mood:  Anxious and Dysphoric  Affect:  Constricted  Thought Process:  Descriptions of Associations: Intact  Orientation:  Full (Time, Place, and Person)  Thought Content:  Delusions and Hallucinations: None  Suicidal Thoughts:  No  Homicidal Thoughts:  No  Memory:  Immediate;   Fair Recent;   Fair Remote;   Fair  Judgement:  Fair  Insight:  Fair  Psychomotor Activity:  Increased  Concentration:  Concentration: Fair and Attention Span: Fair  Recall:  [name] of Knowledge:  Fair  Language:  Good  Akathisia:  Negative  Handed:  Right  AIMS (if indicated):     Assets:  Desire for Improvement Resilience  ADL's:  Intact  Cognition:  WNL  Sleep:  Number of Hours: 6     Treatment Plan Summary: Ptis an 39 y.o.femalewith reported history of bipolar II , Depression and Anxiety. She presented  voluntarily for increased depression with SI/HI. She reports SI to fill the bathtub with water and put the hair dryer in. She reports generalized HI with thoughts of running people over with her car. Today, Pt is cooperative and oriented x4. Pt's mood is anxious and dysphoric and her affect is constricted. Pt speech is clear with normal volume. Pt is not responding to internal stimuli. Denies SI, HI and AVH.  BP- 100/37mmHg, PR- 128/min Labs-Urine Toxicology- Positive for THC Preg test - Negative Urinalysis- Normal Plan- Daily contact with patient to assess and evaluate symptoms and progress in treatment Monitor Vitals. -Monitor for Suicidal Ideation and Homicidal Ideation. -Monitor for withdrawal symptoms. -Monitor for medication side effects. -Continue Acyclovir 400 mg Daily -Continue Lamictal 200 mg Daily -Continue Imodium 2-4 mg PRN -Continue Ativan 1 mg hourly PRN for withdrawal symptoms if CIWA >10 - Continue Multivitamin Daily -Continue Naprosyn 250 mg PRN BID for Pain - Agitation Protocol -Continue Zofran 4 mg 6 Hourly PRN -Continue Protonix 40 mg Daily -Continue Effexor 225 mg daily -Increase Geodon to 40 mg BID -Continue Robaxin 750 mg PRN for muscle spasm. -Continue Hydroxyzine 25 mg TID PRN for Anxiety. -Continue Trazodone 50 mg QHS PRN for sleep.  Armando Reichert, MD 05/02/2020, 10:22 AM

## 2020-05-02 NOTE — Progress Notes (Signed)
Got a call back from [employer]-  She said she met Tynleigh 2 years ago when they both were at [facility] and decided that they would become support system to each other. She said pt was living in her car in [name] and her kids were here so she asked her to move to [place] so as to have more contact with her kids. She said she has never seen Elexius breaking things or getting agitated. She said she has been more depressed, sleeping all day and crying a lot for last few weeks. Pt has been living with her friend for last 3 months. She said Pt had custody agreement with her Ex to see her kids over holidays but now that she is living here in [place] they are changing it to every other weekend. She said that she broke 50 b and she violated that by entering in to her Ex husband's home and broke things. She said she is not aware that the [place] has been forcing her to sign paperwork to give away custody but all she knows that Pt has been having some problems with the lawyer. She confirmed that Pt can return to her home after discharge.

## 2020-05-03 MED ORDER — ACYCLOVIR 200 MG PO CAPS: 400.0000 mg | ORAL_CAPSULE | Freq: Every day | ORAL | 0 refills | Status: DC

## 2020-05-03 MED ORDER — METHOCARBAMOL 750 MG PO TABS
750.0000 mg | ORAL_TABLET | Freq: Every day | ORAL | 0 refills | Status: DC | PRN
Start: 2020-05-03 — End: 2021-11-18

## 2020-05-03 MED ORDER — ZIPRASIDONE HCL 20 MG PO CAPS
20.0000 mg | ORAL_CAPSULE | Freq: Two times a day (BID) | ORAL | Status: DC
  Filled 2020-05-03 (×4): qty 1

## 2020-05-03 MED ORDER — ACYCLOVIR 200 MG PO CAPS: 400.0000 mg | ORAL_CAPSULE | Freq: Every day | ORAL | 0 refills | Status: AC

## 2020-05-03 MED ORDER — HYDROXYZINE HCL 25 MG PO TABS: 25.0000 mg | ORAL_TABLET | Freq: Three times a day (TID) | ORAL | 0 refills | Status: DC | PRN

## 2020-05-03 MED ORDER — ZIPRASIDONE HCL 20 MG PO CAPS
20.0000 mg | ORAL_CAPSULE | Freq: Two times a day (BID) | ORAL | 0 refills | Status: DC
Start: 2020-05-03 — End: 2021-11-18

## 2020-05-03 MED ORDER — ADULT MULTIVITAMIN W/MINERALS CH: 1.0000 | ORAL_TABLET | Freq: Every day | ORAL | 0 refills | Status: DC

## 2020-05-03 MED ORDER — TRAZODONE HCL 50 MG PO TABS
50.0000 mg | ORAL_TABLET | Freq: Every evening | ORAL | 0 refills | Status: DC | PRN
Start: 2020-05-03 — End: 2021-11-18

## 2020-05-03 MED ORDER — LAMOTRIGINE 200 MG PO TABS
200.0000 mg | ORAL_TABLET | Freq: Every day | ORAL | 0 refills | Status: AC
Start: 2020-05-03 — End: 2021-12-16

## 2020-05-03 MED ORDER — VENLAFAXINE HCL ER 75 MG PO CP24
225.0000 mg | ORAL_CAPSULE | Freq: Every day | ORAL | 0 refills | Status: AC
Start: 2020-05-03 — End: 2023-10-13

## 2020-05-03 NOTE — Progress Notes (Signed)
  [facility] Adult Case Management Discharge Plan :  Will you be returning to the same living situation after discharge:  Yes,  with roommate At discharge, do you have transportation home?: Yes,  arranged by patient Do you have the ability to pay for your medications: No.  Release of information consent forms completed and emailed to Medical Records, then turned in to Medical Records by CSW.  Patient to Follow up at:  [facility], [facility]. Go on 05/06/2020.   Why: You have an intake appointment on 05/06/20 at 830am. Come through side entrance by the parking lot Contact information: [place] [address] [phone]               Next level of care provider has access to [place] and Suicide Prevention discussed: Yes,  per handoff from weekday social worker     Has patient been referred to the Quitline?: N/A patient is not a smoker  Patient has been referred for addiction treatment: Yes  Maretta Los, LCSW 05/03/2020, 10:55 AM

## 2020-05-03 NOTE — BHH Suicide Risk Assessment (Signed)
[place] INPATIENT:  Family/Significant Other Suicide Prevention Education  Suicide Prevention Education:  Education Completed; roommate Angelene Giovanni [phone],  (name of family member/significant other) has been identified by the patient as the family member/significant other with whom the patient will be residing, and identified as the person(s) who will aid the patient in the event of a mental health crisis (suicidal ideations/suicide attempt).  With written consent from the patient, the family member/significant other has been provided the following suicide prevention education, prior to the and/or following the discharge of the patient.  The suicide prevention education provided includes the following:  Suicide risk factors  Suicide prevention and interventions  National Suicide Hotline telephone number  [facility] assessment telephone number  [facility] Emergency Assistance [place] and/or Residential Mobile Crisis Unit telephone number  Request made of family/significant other to:  Remove weapons (e.g., guns, rifles, knives), all items previously/currently identified as safety concern.    Remove drugs/medications (over-the-counter, prescriptions, illicit drugs), all items previously/currently identified as a safety concern.  The family member/significant other verbalizes understanding of the suicide prevention education information provided.  The family member/significant other agrees to remove the items of safety concern listed above.  Per handoff from weekday social worker Macon Large, the above information was provided.  Berlin Hun Grossman-Orr 05/03/2020, 10:56 AM

## 2020-05-03 NOTE — BHH Suicide Risk Assessment (Signed)
[facility] Discharge Suicide Risk Assessment   Principal Problem: <principal problem not specified> Discharge Diagnoses: Active Problems:   MDD (major depressive disorder), recurrent severe, without psychosis ([place])   Total Time spent with patient: 15 minutes  Musculoskeletal: Strength & Muscle Tone: within normal limits Gait & Station: normal Patient leans: N/A  Psychiatric Specialty Exam: Review of Systems  All other systems reviewed and are negative.   Blood pressure 100/65, pulse (!) 120, temperature 98.4 F (36.9 C), temperature source Oral, resp. rate 18, height 5\' 5"  (1.651 m), weight 90.7 kg, SpO2 99 %.Body mass index is 33.28 kg/m.  General Appearance: Casual  Eye Contact::  Good  Speech:  Normal Rate409  Volume:  Normal  Mood:  Anxious  Affect:  Congruent  Thought Process:  Coherent and Descriptions of Associations: Circumstantial  Orientation:  Full (Time, Place, and Person)  Thought Content:  Logical  Suicidal Thoughts:  No  Homicidal Thoughts:  No  Memory:  Immediate;   Fair Recent;   Fair Remote;   Fair  Judgement:  Intact  Insight:  Fair  Psychomotor Activity:  Normal  Concentration:  Fair  Recall:  [name] of Knowledge:Good  Language: Good  Akathisia:  Negative  Handed:  Right  AIMS (if indicated):     Assets:  Desire for Improvement Housing Resilience  Sleep:  Number of Hours: 6.75  Cognition: WNL  ADL's:  Intact   Mental Status Per Nursing Assessment::   On Admission:  Suicidal ideation indicated by patient  Demographic Factors:  Divorced or widowed, Caucasian, Low socioeconomic status and Unemployed  Loss Factors: Loss of significant relationship  Historical Factors: Impulsivity  Risk Reduction Factors:   Sense of responsibility to family  Continued Clinical Symptoms:  Bipolar Disorder:   Bipolar II Personality Disorders:   Cluster B  Cognitive Features That Contribute To Risk:  Thought constriction (tunnel vision)    Suicide  Risk:  Minimal: No identifiable suicidal ideation.  Patients presenting with no risk factors but with morbid ruminations; may be classified as minimal risk based on the severity of the depressive symptoms   Follow-up [place], [place]. Go on 05/06/2020.   Why: You have an intake appointment on 05/06/20 at 830am. Come through side entrance by the parking lot Contact information: [place] [address] [phone]               Plan Of Care/Follow-up recommendations:  Activity:  ad lib  Sharma Covert, MD 05/03/2020, 8:07 AM
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
## 2020-05-03 NOTE — Progress Notes (Signed)
   05/03/20 0050  Psych Admission Type (Psych Patients Only)  Admission Status Voluntary  Psychosocial Assessment  Patient Complaints Anxiety;Crying spells  Eye Contact Fair  Facial Expression Animated  Affect Sad  Speech Logical/coherent  Interaction Assertive  Appearance/Hygiene Unremarkable  Behavior Characteristics Cooperative  Mood Anxious;Depressed  Thought Process  Coherency WDL  Content WDL  Delusions WDL  Perception WDL  Hallucination None reported or observed  Judgment WDL  Confusion WDL  Danger to Self  Current suicidal ideation? Denies  Danger to Others  Danger to Others None reported or observed  Patient pleasant on approach tonight. Reports that her mood has been up and down today. Crying in room earlier today per report but come to dayroom and it has helped. No SI tonight. Smiling and laughing with peers. Took medications tonight but didn't want protonix.

## 2020-05-03 NOTE — [facility] Group Notes (Signed)
Adult Psychoeducational Group Note  Date:  05/03/2020 Time:  12:40 PM  Group Topic/Focus:  Goals Group:   The focus of this group is to help patients establish daily goals to achieve during treatment and discuss how the patient can incorporate goal setting into their daily lives to aide in recovery.  Participation Level:  Did Not Attend   Paulino Rily 05/03/2020, 12:40 PM

## 2020-05-03 NOTE — Progress Notes (Signed)
Patient denies SI/HI. Pt verbalized readiness for discharge today. Pt received both written and verbal discharge instructions. Pt verbalized understanding of discharge instructions. Pt agreed to f/u appt and med regimen. Pt received an AVS, SRA, transitional record, prescriptions and sample meds. Pt gathered belongings from her room and locker. Pt safely discharge to the lobby.

## 2020-05-03 NOTE — [facility] Group Notes (Signed)
LCSW Group Therapy Note  05/03/2020   10:00-11:00am   Type of Therapy and Topic:  Group Therapy: Anger Cues and Responses  Participation Level:  Active   Description of Group:   In this group, patients learned how to recognize the physical, cognitive, emotional, and behavioral responses they have to anger-provoking situations.  They identified a recent time they became angry and how they reacted.  They analyzed how their reaction was possibly beneficial and how it was possibly unhelpful.  The group discussed a variety of healthier coping skills that could help with such a situation in the future.  Focus was placed on how helpful it is to recognize the underlying emotions to our anger, because working on those can lead to a more permanent solution as well as our ability to focus on the important rather than the urgent.  Therapeutic Goals: 1. Patients will remember their last incident of anger and how they felt emotionally and physically, what their thoughts were at the time, and how they behaved. 2. Patients will identify how their behavior at that time worked for them, as well as how it worked against them. 3. Patients will explore possible new behaviors to use in future anger situations. 4. Patients will learn that anger itself is normal and cannot be eliminated, and that healthier reactions can assist with resolving conflict rather than worsening situations.  Summary of Patient Progress:  Patient engaged in group. Patient reports she feels frustrated today because I feel like we are being over medicated. Patient shared she gets angry really quickly so it's hard for her to cope or think logically. Patient reports she also struggles with anxiety.   Therapeutic Modalities:   Cognitive Behavioral Therapy  Tye Savoy

## 2020-08-11 DIAGNOSIS — F3181 Bipolar II disorder: Secondary | ICD-10-CM | POA: Diagnosis not present

## 2020-08-11 DIAGNOSIS — F331 Major depressive disorder, recurrent, moderate: Secondary | ICD-10-CM | POA: Diagnosis not present

## 2020-08-11 DIAGNOSIS — F411 Generalized anxiety disorder: Secondary | ICD-10-CM | POA: Diagnosis not present

## 2020-08-20 DIAGNOSIS — F411 Generalized anxiety disorder: Secondary | ICD-10-CM | POA: Diagnosis not present

## 2020-08-20 DIAGNOSIS — F331 Major depressive disorder, recurrent, moderate: Secondary | ICD-10-CM | POA: Diagnosis not present

## 2020-08-20 DIAGNOSIS — F314 Bipolar disorder, current episode depressed, severe, without psychotic features: Secondary | ICD-10-CM | POA: Diagnosis not present

## 2020-08-20 DIAGNOSIS — F4010 Social phobia, unspecified: Secondary | ICD-10-CM | POA: Diagnosis not present

## 2020-08-20 DIAGNOSIS — F4310 Post-traumatic stress disorder, unspecified: Secondary | ICD-10-CM | POA: Diagnosis not present

## 2020-10-01 DIAGNOSIS — F411 Generalized anxiety disorder: Secondary | ICD-10-CM | POA: Diagnosis not present

## 2020-10-01 DIAGNOSIS — F314 Bipolar disorder, current episode depressed, severe, without psychotic features: Secondary | ICD-10-CM | POA: Diagnosis not present

## 2020-10-01 DIAGNOSIS — F4310 Post-traumatic stress disorder, unspecified: Secondary | ICD-10-CM | POA: Diagnosis not present

## 2020-10-31 DIAGNOSIS — F411 Generalized anxiety disorder: Secondary | ICD-10-CM | POA: Diagnosis not present

## 2020-10-31 DIAGNOSIS — F314 Bipolar disorder, current episode depressed, severe, without psychotic features: Secondary | ICD-10-CM | POA: Diagnosis not present

## 2020-10-31 DIAGNOSIS — F4310 Post-traumatic stress disorder, unspecified: Secondary | ICD-10-CM | POA: Diagnosis not present

## 2021-05-10 DIAGNOSIS — M79671 Pain in right foot: Secondary | ICD-10-CM | POA: Diagnosis not present

## 2021-05-10 DIAGNOSIS — S93601A Unspecified sprain of right foot, initial encounter: Secondary | ICD-10-CM | POA: Diagnosis not present

## 2021-05-10 DIAGNOSIS — W1839XA Other fall on same level, initial encounter: Secondary | ICD-10-CM | POA: Diagnosis not present

## 2021-05-21 DIAGNOSIS — F4310 Post-traumatic stress disorder, unspecified: Secondary | ICD-10-CM | POA: Diagnosis not present

## 2021-05-21 DIAGNOSIS — F411 Generalized anxiety disorder: Secondary | ICD-10-CM | POA: Diagnosis not present

## 2021-05-21 DIAGNOSIS — F314 Bipolar disorder, current episode depressed, severe, without psychotic features: Secondary | ICD-10-CM | POA: Diagnosis not present

## 2021-08-14 DIAGNOSIS — F411 Generalized anxiety disorder: Secondary | ICD-10-CM | POA: Diagnosis not present

## 2021-08-14 DIAGNOSIS — F314 Bipolar disorder, current episode depressed, severe, without psychotic features: Secondary | ICD-10-CM | POA: Diagnosis not present

## 2021-08-14 DIAGNOSIS — F4310 Post-traumatic stress disorder, unspecified: Secondary | ICD-10-CM | POA: Diagnosis not present

## 2021-11-18 ENCOUNTER — Encounter: Payer: Self-pay | Admitting: Family

## 2021-11-18 ENCOUNTER — Ambulatory Visit (INDEPENDENT_AMBULATORY_CARE_PROVIDER_SITE_OTHER): Payer: Medicaid Other | Admitting: Family

## 2021-11-18 VITALS — BP 103/71 | HR 86 | Temp 97.5°F | Wt 228.8 lb

## 2021-11-18 DIAGNOSIS — Z0001 Encounter for general adult medical examination with abnormal findings: Secondary | ICD-10-CM

## 2021-11-18 DIAGNOSIS — S82891A Other fracture of right lower leg, initial encounter for closed fracture: Secondary | ICD-10-CM | POA: Insufficient documentation

## 2021-11-18 DIAGNOSIS — F411 Generalized anxiety disorder: Secondary | ICD-10-CM | POA: Diagnosis not present

## 2021-11-18 DIAGNOSIS — F314 Bipolar disorder, current episode depressed, severe, without psychotic features: Secondary | ICD-10-CM | POA: Diagnosis not present

## 2021-11-18 DIAGNOSIS — F4310 Post-traumatic stress disorder, unspecified: Secondary | ICD-10-CM | POA: Diagnosis not present

## 2021-11-18 DIAGNOSIS — B009 Herpesviral infection, unspecified: Secondary | ICD-10-CM

## 2021-11-18 DIAGNOSIS — Z0000 Encounter for general adult medical examination without abnormal findings: Secondary | ICD-10-CM

## 2021-11-18 MED ORDER — VALACYCLOVIR HCL 500 MG PO TABS: ORAL_TABLET | ORAL | 5 refills | Status: DC

## 2021-11-18 NOTE — Patient Instructions (Addendum)
Welcome to [facility]! It was a pleasure meeting you today. ? ?Go to the lab for blood work today. ? ?I ordered a breast mammogram today, they will call you directly to schedule. ? ?As discussed, I have sent the Valtrex to your pharmacy. ?For suppression: Take 1 pill daily, but take 2 pills daily 1 week prior to your menstrual cycle, during your cycle, then reduce to 1 pill daily.  ?For active episode: Take 4 tablets (total 2000 mg) by mouth q12h x 1 day; Start ASAP after symptom onset. ? ?Please schedule a visit for a PAP smear and 6 month follow up visit today. ? ? ? ? ?PLEASE NOTE: ? ?If you had any LAB tests please let us know if you have not heard back within a few days. You may see your results on MyChart before we have a chance to review them but we will give you a call once they are reviewed by [place]. If we ordered any REFERRALS today, please let us know if you have not heard from their office within the next week.  ?Let us know through MyChart if you are needing REFILLS, or have your pharmacy send [place] the request. You can also use MyChart to communicate with me or any office staff. ? ?Please try these tips to maintain a healthy lifestyle: ? ?Eat most of your calories during the day when you are active. Eliminate processed foods including packaged sweets (pies, cakes, cookies), reduce intake of potatoes, white bread, white pasta, and white rice. Look for whole grain options, oat flour or almond flour. ? ?Each meal should contain half fruits/vegetables, one quarter protein, and one quarter carbs (no bigger than a computer mouse). ? ?Cut down on sweet beverages. This includes juice, soda, and sweet tea. Also watch fruit intake, though this is a healthier sweet option, it still contains natural sugar! Limit to 3 servings daily. ? ?Drink at least 1 glass of water with each meal and aim for at least 8 glasses per day ? ?Exercise at least 150 minutes every week.  ? ?

## 2021-11-18 NOTE — Assessment & Plan Note (Signed)
Chronic - pt reports having episodes monthly prior to her menses. No current outbreak today, advised taking 1 tab qd for suppression, 1 week prior to menses take 2 pills and during cycle, then reduce to 1 pill daily. f/u 6 mos. ?

## 2021-11-18 NOTE — Progress Notes (Signed)
?Phone [phone] ?  ?Subjective:  ? ?Patient is a 41 y.o. female presenting for annual physical.   ? ?Chief Complaint  ?Patient presents with  ? Establish Care  ? Herpes Zoster  ?  Discuss getting on medication for herpes. Diagnosed in 2017.   ? Annual Exam  ?  Not fasting w/ Labs  ? ?Sexually Transmitted Disease: Patient presents for sexually transmitted disease follow up. Sexual history reviewed with the patient. STD exposure: none recently.  Previous history of STD:  HSV. Current symptoms include none.  Contraception: IUD.  ? ?See problem oriented charting- ?ROS- full  review of systems was completed and negative ?except for: STD treatment noted in HPI above. ? ?The following were reviewed and entered/updated in epic: ?Past Medical History:  ?Diagnosis Date  ? Ankle fracture, right   ? Anxiety   ? Arthritis   ? "in my back" (04/08/2015)  ? Chronic lower back pain   ? Complication of anesthesia   ? difficulty urinating after anesthesia  ? Headache   ? "monthly" (04/08/2015)  ? Lumbar pain with radiation down both legs 09/11/2013  ? Post partum depression   ? Preventative health care 10/29/2014  ? ?Patient Active Problem List  ? Diagnosis Date Noted  ? Cervical spondylosis with radiculopathy 06/09/2018  ? S/P cervical spinal fusion 06/09/2018  ? Suicidal ideation 02/16/2018  ? MDD (major depressive disorder), recurrent severe, without psychosis ([place]) 02/15/2018  ? Neck pain 12/02/2017  ? Cervical disc herniation 12/02/2017  ? Bipolar disorder, unspecified ([place]) 11/04/2017  ? Cervical disc disorder with radiculopathy of cervical region 10/21/2017  ? S/P spinal surgery 08/12/2017  ? Sacroiliac joint pain 05/10/2017  ? Lumbar radiculopathy 04/01/2017  ? 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 DDD (degenerative disc disease), lumbar 01/13/2017  ? Depression with anxiety 08/04/2016  ? HSV-2 (herpes simplex virus 2) infection 04/07/2016  ? PMDD (premenstrual dysphoric disorder) 07/29/2015  ? Obesity 06/10/2014  ?  Chronic meniscal tear of knee 11/23/2013  ? Generalized anxiety disorder 06/18/2013  ? ?Past Surgical History:  ?Procedure Laterality Date  ? ANTERIOR FUSION LUMBAR SPINE  07/06/2017  ? BREAST SURGERY    ? CESAREAN SECTION  2007 and 2009  ? COMBINED ABDOMINOPLASTY AND LIPOSUCTION  12/2014  ? "liposuction in thighs"  ? FRACTURE SURGERY    ? LAPAROSCOPIC ABDOMINAL EXPLORATION  1997  ? "to check for endometriosis"  ? LAPAROSCOPIC CHOLECYSTECTOMY  2007  ? OPEN REDUCTION INTERNAL FIXATION (ORIF) TIBIA/FIBULA FRACTURE Right 04/08/2015  ? Procedure: OPEN REDUCTION INTERNAL FIXATION (ORIF)  RIGHT TIBIA PILON FRACTURE;  Surgeon: Wylene Simmer, MD;  Location: [place];  Service: Orthopedics;  Laterality: Right;  ? TIBIA FRACTURE SURGERY Right 04/08/2015  ? ORIF tibial pilon  ? ? ?Family History  ?Problem Relation Age of Onset  ? Diabetes Father   ? Cancer Sister 17  ?     breast, had chemo/double mastectomy  ? Heart disease Maternal Grandfather   ? Heart disease Paternal Grandfather   ? ? ?Medications- reviewed and updated ?Current Outpatient Medications  ?Medication Sig Dispense Refill  ? lamoTRIgine (LAMICTAL) 200 MG tablet Take 1 tablet (200 mg total) by mouth at bedtime. 30 tablet 0  ? LATUDA 60 MG TABS SMARTSIG:1 Tablet(s) By Mouth Every Evening    ? levonorgestrel (MIRENA, 52 MG,) 20 MCG/DAY IUD by Intrauterine route.    ? LORazepam (ATIVAN) 1 MG tablet Take 1 mg by mouth daily as needed.    ? valACYclovir (VALTREX) 500 MG tablet For  suppression: Take 1 pill daily. Take 2 pills daily 1 week prior to your menstrual cycle, during your cycle, then reduce to 1 pill daily. For active episode: Take 4 tablets (total 2000 mg) by mouth q12h x 1 day; Start ASAP after symptom onset. 60 tablet 5  ? venlafaxine XR (EFFEXOR-XR) 75 MG 24 hr capsule Take 3 capsules (225 mg total) by mouth at bedtime. 90 capsule 0  ? ?No current facility-administered medications for this visit.  ? ? ?Allergies-reviewed and updated ?Allergies  ?Allergen Reactions   ? Ciprofloxacin Hives  ? Lexapro [Escitalopram] Nausea And Vomiting  ? Trintellix [Vortioxetine]   ? Amoxicillin Hives and Other (See Comments)  ?  Has patient had a PCN reaction causing immediate rash, facial/tongue/throat swelling, SOB or lightheadedness with hypotension: no ?Has patient had a PCN reaction causing severe rash involving mucus membranes or skin necrosis: no ?Has patient had a PCN reaction that required hospitalization: no ?Has patient had a PCN reaction occurring within the last 10 years: no - childhood reaction ?If all of the above answers are "NO", then may proceed with Cephalosporin use. ?  ? Ceclor [Cefaclor] Hives  ? Erythromycin Base Rash  ? Penicillins Hives and Other (See Comments)  ?  Has patient had a PCN reaction causing immediate rash, facial/tongue/throat swelling, SOB or lightheadedness with hypotension: no ?Has patient had a PCN reaction causing severe rash involving mucus membranes or skin necrosis: no ?Has patient had a PCN reaction that required hospitalization: no ?Has patient had a PCN reaction occurring within the last 10 years: no - childhood reaction ?If all of the above answers are "NO", then may proceed with Cephalosporin use.  ? Phenergan [Promethazine Hcl] Hives  ? Sulfa Antibiotics Hives  ? Zithromax [Azithromycin] Hives  ? ? ?Social History  ? ?Social History Narrative  ? 2 children- son 2007- [name] 2009  ? 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 Married  ? Completed tech school  ? Enjoys shopping, spending time with friends, reading.   ?   ? ?Objective  ?Objective:  ?BP 103/71 (BP Location: Left Arm, Patient Position: Sitting, Cuff Size: Large)   Pulse 86   Temp (!) 97.5 ?F (36.4 ?C) (Temporal)   Wt 228 lb 12.8 oz (103.8 kg)   LMP 10/14/2021 (Exact Date)   SpO2 98%   BMI 38.07 kg/m?  ?Physical Exam ?Vitals and nursing note reviewed.  ?Constitutional:   ?   Appearance: Normal appearance.  ?HENT:  ?   Head: Normocephalic.  ?   Right Ear: Tympanic  membrane normal.  ?   Left Ear: Tympanic membrane normal.  ?   Nose: Nose normal.  ?   Mouth/Throat:  ?   Mouth: Mucous membranes are moist.  ?Eyes:  ?   Pupils: Pupils are equal, round, and reactive to light.  ?Cardiovascular:  ?   Rate and Rhythm: Normal rate and regular rhythm.  ?Pulmonary:  ?   Effort: Pulmonary effort is normal.  ?   Breath sounds: Normal breath sounds.  ?Musculoskeletal:     ?   General: Normal range of motion.  ?   Cervical back: Normal range of motion.  ?Lymphadenopathy:  ?   Cervical: No cervical adenopathy.  ?Skin: ?   General: Skin is warm and dry.  ?Neurological:  ?   Mental Status: She is alert.  ?Psychiatric:     ?   Mood and Affect: Mood normal.     ?  Behavior: Behavior normal.  ? ? ?  ?Assessment and Plan  ? ?Health Maintenance counseling: ?1. Anticipatory guidance: Patient counseled regarding regular dental exams q6 months, eye exams,  avoiding smoking and second hand smoke, limiting alcohol to 1 beverage per day, no illicit drugs.   ?2. Risk factor reduction:  Advised patient of need for regular exercise and diet rich with fruits and vegetables to reduce risk of heart attack and stroke.  ?Wt Readings from Last 3 Encounters:  ?11/18/21 228 lb 12.8 oz (103.8 kg)  ?06/20/18 226 lb (102.5 kg)  ?06/12/18 229 lb (103.9 kg)  ? ?3. Immunizations/screenings/ancillary studies ?Immunization History  ?Administered Date(s) Administered  ? Influenza,inj,Quad PF,6+ Mos 06/18/2013, 06/10/2014, 07/29/2015, 07/14/2016, 06/29/2018, 06/19/2019  ? Moderna Sars-Covid-2 Vaccination 01/16/2020, 02/13/2020  ? Td 10/28/2014  ? Tdap 09/06/2004  ? ?Health Maintenance Due  ?Topic Date Due  ? PAP SMEAR-Modifier  05/29/2021  ?  ?4. Cervical cancer screening- due, pt to schedule f/u visit ?5. Breast cancer screening-  mammogram - last one a few years ago, sister with breast CA, ordering today ?6. Colon cancer screening - N/A ?7. Skin cancer screening- advised regular sunscreen use. Denies worrisome, changing,  or new skin lesions.  ?8. Birth control/STD check- Mirena IUD ?9. Osteoporosis screening- N/A ?10. Smoking associated screening (lung cancer screening, AAA screen 65-75, UA)- N/A  ? ?Problem List Items Addressed

## 2021-11-19 LAB — CBC WITH DIFFERENTIAL/PLATELET
Basophils Absolute: 0.1 10*3/uL (ref 0.0–0.1)
Basophils Relative: 0.9 % (ref 0.0–3.0)
Eosinophils Absolute: 0.3 10*3/uL (ref 0.0–0.7)
Eosinophils Relative: 3.9 % (ref 0.0–5.0)
HCT: 42.1 % (ref 36.0–46.0)
Hemoglobin: 14.1 g/dL (ref 12.0–15.0)
Lymphocytes Relative: 42.7 % (ref 12.0–46.0)
Lymphs Abs: 3 10*3/uL (ref 0.7–4.0)
MCHC: 33.5 g/dL (ref 30.0–36.0)
MCV: 88 fl (ref 78.0–100.0)
Monocytes Absolute: 0.6 10*3/uL (ref 0.1–1.0)
Monocytes Relative: 8.4 % (ref 3.0–12.0)
Neutro Abs: 3.1 10*3/uL (ref 1.4–7.7)
Neutrophils Relative %: 44.1 % (ref 43.0–77.0)
Platelets: 212 10*3/uL (ref 150.0–400.0)
RBC: 4.78 Mil/uL (ref 3.87–5.11)
RDW: 13.3 % (ref 11.5–15.5)
WBC: 7.1 10*3/uL (ref 4.0–10.5)

## 2021-11-19 LAB — COMPREHENSIVE METABOLIC PANEL
ALT: 14 U/L (ref 0–35)
AST: 16 U/L (ref 0–37)
Albumin: 4.4 g/dL (ref 3.5–5.2)
Alkaline Phosphatase: 86 U/L (ref 39–117)
BUN: 12 mg/dL (ref 6–23)
CO2: 26 mEq/L (ref 19–32)
Calcium: 9.5 mg/dL (ref 8.4–10.5)
Chloride: 101 mEq/L (ref 96–112)
Creatinine, Ser: 1.04 mg/dL (ref 0.40–1.20)
GFR: 66.44 mL/min (ref >60.00)
Glucose, Bld: 85 mg/dL (ref 70–99)
Potassium: 3.9 mEq/L (ref 3.5–5.1)
Sodium: 138 mEq/L (ref 135–145)
Total Bilirubin: 0.4 mg/dL (ref 0.2–1.2)
Total Protein: 7.1 g/dL (ref 6.0–8.3)

## 2021-11-19 LAB — LIPID PANEL
Cholesterol: 187 mg/dL (ref 0–200)
HDL: 48 mg/dL (ref >39.00)
LDL Cholesterol: 106 mg/dL — ABNORMAL HIGH (ref 0–99)
NonHDL: 138.57
Total CHOL/HDL Ratio: 4
Triglycerides: 163 mg/dL — ABNORMAL HIGH (ref 0.0–149.0)
VLDL: 32.6 mg/dL (ref 0.0–40.0)

## 2021-11-19 LAB — TSH: TSH: 1.35 u[IU]/mL (ref 0.35–5.50)

## 2021-11-20 NOTE — Progress Notes (Signed)
Hi Jazz, ? ?Your glucose, electrolytes, blood count, thyroid, liver & kidney function are all normal. ? ?Your triglycerides are a little high, mostly due to not fasting, so this is not concerning. Your LDL (bad #) is a little high, so try to reduce any fried foods, alcohol, nonnutritional snacks e.g. chips/cookies,pies, cakes and candies, fatty meat (red meat), high fat dairy foods:  including cheese, milk, ice cream.  ?Increase fruits/vegetables/fiber.   ?Continue or restart an exercise routine, shooting for 50mn 5-7days per week.  ? ?Recommend repeating fasting lipids (no food or drink except black coffee or water after midnight) in 6 months with an office visit, can schedule today. ? ?Any questions, let me know!

## 2021-11-30 ENCOUNTER — Ambulatory Visit (INDEPENDENT_AMBULATORY_CARE_PROVIDER_SITE_OTHER): Payer: Medicaid Other | Admitting: Family

## 2021-11-30 ENCOUNTER — Other Ambulatory Visit (HOSPITAL_COMMUNITY)
Admission: RE | Admit: 2021-11-30 | Discharge: 2021-11-30 | Disposition: A | Discharge status: Home / Self Care | Payer: Medicaid Other | Source: Ambulatory Visit | Attending: Family | Admitting: Family

## 2021-11-30 VITALS — BP 110/72 | HR 100 | Temp 98.6°F | Ht 65.0 in | Wt 231.2 lb

## 2021-11-30 DIAGNOSIS — B009 Herpesviral infection, unspecified: Secondary | ICD-10-CM

## 2021-11-30 DIAGNOSIS — Z124 Encounter for screening for malignant neoplasm of cervix: Secondary | ICD-10-CM | POA: Diagnosis present

## 2021-11-30 NOTE — Progress Notes (Signed)
? ?Subjective:  ? ? ? Patient ID: Gina James, female    DOB: 1980/10/26, 41 y.o.   MRN: [id number] ? ?Chief Complaint  ?Patient presents with  ? Gynecologic Exam  ? ?HPI: ?PAP smear encounter:  pt here for PAP smear. Denies any concerns. Would like STD testing done as well.  ? ?Sexually Transmitted Disease: Patient presents for sexually transmitted disease follow up. Sexual history reviewed with the patient. STD exposure: none recently.  Previous history of STD:  HSV. Current symptoms include none.  Contraception: IUD.  ? ?Health Maintenance Due  ?Topic Date Due  ? PAP SMEAR-Modifier  05/29/2021  ? ? ?Past Medical History:  ?Diagnosis Date  ? Ankle fracture, right   ? Anxiety   ? Arthritis   ? "in my back" (04/08/2015)  ? Chronic lower back pain   ? Complication of anesthesia   ? difficulty urinating after anesthesia  ? Headache   ? "monthly" (04/08/2015)  ? Lumbar pain with radiation down both legs 09/11/2013  ? Post partum depression   ? Preventative health care 10/29/2014  ? ? ?Past Surgical History:  ?Procedure Laterality Date  ? ANTERIOR FUSION LUMBAR SPINE  07/06/2017  ? BREAST SURGERY    ? CESAREAN SECTION  2007 and 2009  ? COMBINED ABDOMINOPLASTY AND LIPOSUCTION  12/2014  ? "liposuction in thighs"  ? FRACTURE SURGERY    ? LAPAROSCOPIC ABDOMINAL EXPLORATION  1997  ? "to check for endometriosis"  ? LAPAROSCOPIC CHOLECYSTECTOMY  2007  ? OPEN REDUCTION INTERNAL FIXATION (ORIF) TIBIA/FIBULA FRACTURE Right 04/08/2015  ? Procedure: OPEN REDUCTION INTERNAL FIXATION (ORIF)  RIGHT TIBIA PILON FRACTURE;  Surgeon: Wylene Simmer, MD;  Location: [place];  Service: Orthopedics;  Laterality: Right;  ? TIBIA FRACTURE SURGERY Right 04/08/2015  ? ORIF tibial pilon  ? ? ?Outpatient Medications Prior to Visit  ?Medication Sig Dispense Refill  ? lamoTRIgine (LAMICTAL) 200 MG tablet Take 1 tablet (200 mg total) by mouth at bedtime. 30 tablet 0  ? LATUDA 60 MG TABS SMARTSIG:1 Tablet(s) By Mouth Every Evening    ? levonorgestrel (MIRENA, 52 MG,)  20 MCG/DAY IUD by Intrauterine route.    ? LORazepam (ATIVAN) 1 MG tablet Take 1 mg by mouth daily as needed.    ? venlafaxine XR (EFFEXOR-XR) 75 MG 24 hr capsule Take 3 capsules (225 mg total) by mouth at bedtime. 90 capsule 0  ? valACYclovir (VALTREX) 500 MG tablet For suppression: Take 1 pill daily. Take 2 pills daily 1 week prior to your menstrual cycle, during your cycle, then reduce to 1 pill daily. For active episode: Take 4 tablets (total 2000 mg) by mouth q12h x 1 day; Start ASAP after symptom onset. 60 tablet 5  ? ?No facility-administered medications prior to visit.  ? ? ?Allergies  ?Allergen Reactions  ? Ciprofloxacin Hives  ? Lexapro [Escitalopram] Nausea And Vomiting  ? Trintellix [Vortioxetine]   ? Amoxicillin Hives and Other (See Comments)  ?  Has patient had a PCN reaction causing immediate rash, facial/tongue/throat swelling, SOB or lightheadedness with hypotension: no ?Has patient had a PCN reaction causing severe rash involving mucus membranes or skin necrosis: no ?Has patient had a PCN reaction that required hospitalization: no ?Has patient had a PCN reaction occurring within the last 10 years: no - childhood reaction ?If all of the above answers are "NO", then may proceed with Cephalosporin use. ?  ? Ceclor [Cefaclor] Hives  ? Erythromycin Base Rash  ? Penicillins Hives and Other (See Comments)  ?  Has patient had a PCN reaction causing immediate rash, facial/tongue/throat swelling, SOB or lightheadedness with hypotension: no ?Has patient had a PCN reaction causing severe rash involving mucus membranes or skin necrosis: no ?Has patient had a PCN reaction that required hospitalization: no ?Has patient had a PCN reaction occurring within the last 10 years: no - childhood reaction ?If all of the above answers are "NO", then may proceed with Cephalosporin use.  ? Phenergan [Promethazine Hcl] Hives  ? Sulfa Antibiotics Hives  ? Zithromax [Azithromycin] Hives  ? ? ? ?   ?Objective:  ?  ?Physical  Exam ?Vitals and nursing note reviewed.  ?Constitutional:   ?   Appearance: Normal appearance.  ?Cardiovascular:  ?   Rate and Rhythm: Normal rate and regular rhythm.  ?Pulmonary:  ?   Effort: Pulmonary effort is normal.  ?   Breath sounds: Normal breath sounds.  ?Genitourinary: ?   Exam position: Lithotomy position.  ?   Labia:     ?   Right: No rash or lesion.     ?   Left: No rash or lesion.   ?   Vagina: Normal.  ?   Cervix: Discharge present.  ?   Comments: cervix not well-visualized; PAP smear specimen obtained. ?Musculoskeletal:     ?   General: Normal range of motion.  ?Skin: ?   General: Skin is warm and dry.  ?Neurological:  ?   Mental Status: She is alert.  ?Psychiatric:     ?   Mood and Affect: Mood normal.     ?   Behavior: Behavior normal.  ? ? ?BP 110/72   Pulse 100   Temp 98.6 ?F (37 ?C)   Ht '5\' 5"'$  (1.651 m)   Wt 231 lb 3.2 oz (104.9 kg)   SpO2 99%   BMI 38.47 kg/m?  ?Wt Readings from Last 3 Encounters:  ?11/30/21 231 lb 3.2 oz (104.9 kg)  ?11/18/21 228 lb 12.8 oz (103.8 kg)  ?06/20/18 226 lb (102.5 kg)  ? ? ?   ?Assessment & Plan:  ? ?Problem List Items Addressed This Visit   ? ?  ? Other  ? HSV-2 (herpes simplex virus 2) infection  ?  Chronic - pt reports pharmacy not giving her the Valtrex refill, resending and clarifying instructions. ?  ?  ? Relevant Medications  ? valACYclovir (VALTREX) 500 MG tablet  ? ?Other Visit Diagnoses   ? ? Encounter for Pap smear of cervix with HPV DNA cotesting    -  Primary  ? Relevant Orders  ? Cytology - PAP  ? ?  ? ? ?Meds ordered this encounter  ?Medications  ? valACYclovir (VALTREX) 500 MG tablet  ?  Sig: For suppression: Take 1 pill daily, then 1 week prior to your menstrual cycle take 2 pills daily, then 2 days after your cycle reduce to 1 pill daily. For active episode: Take 4 tablets (total 2000 mg) by mouth q12h x 1 day; Start ASAP after symptom onset.  ?  Dispense:  60 tablet  ?  Refill:  5  ?  Order Specific Question:   Supervising Provider  ?   Answer:   ANDY, CAMILLE L [[id number]]  ? ? ?Jeanie Sewer, NP ? ?

## 2021-12-02 MED ORDER — VALACYCLOVIR HCL 500 MG PO TABS: ORAL_TABLET | ORAL | 5 refills | Status: DC

## 2021-12-02 NOTE — Assessment & Plan Note (Signed)
Chronic - pt reports pharmacy not giving her the Valtrex refill, resending and clarifying instructions. ?

## 2021-12-08 LAB — CYTOLOGY - PAP
Chlamydia: NEGATIVE
Comment: NEGATIVE
Comment: NEGATIVE
Comment: NEGATIVE
Comment: NEGATIVE
Comment: NEGATIVE
Comment: NORMAL
Diagnosis: NEGATIVE
HPV 16: NEGATIVE
HPV 18 / 45: NEGATIVE
High risk HPV: POSITIVE — AB
Neisseria Gonorrhea: NEGATIVE
Trichomonas: NEGATIVE

## 2021-12-11 ENCOUNTER — Encounter: Payer: Self-pay | Admitting: Family

## 2021-12-11 NOTE — Progress Notes (Signed)
Hi Gina James, ? ?Your PAP test is negative for any cancer cells, and negative for the high risk HPV (STD virus) strains that lead to cervical cancer, but you are positive for one of the high risk strains, but is less likely to cause cancer. ? ?Have you ever had a positive HPV test in the past? If yes, I would recommend follow up with GYN, if no, it is recommended to recheck the PAP in 1 year and the HPV usually resolves on its own. ? ?Let me know, thanks.

## 2021-12-16 ENCOUNTER — Ambulatory Visit: Payer: BC Managed Care – PPO | Admitting: Family

## 2021-12-16 ENCOUNTER — Encounter: Payer: Self-pay | Admitting: Family

## 2021-12-16 VITALS — BP 109/78 | HR 81 | Temp 98.3°F | Ht 65.0 in | Wt 233.0 lb

## 2021-12-16 DIAGNOSIS — M5126 Other intervertebral disc displacement, lumbar region: Secondary | ICD-10-CM | POA: Diagnosis not present

## 2021-12-16 MED ORDER — METHOCARBAMOL 500 MG PO TABS: 500.0000 mg | ORAL_TABLET | Freq: Three times a day (TID) | ORAL | 2 refills | Status: DC | PRN

## 2021-12-16 MED ORDER — MELOXICAM 7.5 MG PO TABS: 7.5000 mg | ORAL_TABLET | Freq: Every day | ORAL | 0 refills | Status: DC

## 2021-12-16 NOTE — Progress Notes (Signed)
? ?Subjective:  ? ? ? Patient ID: Gina James, female    DOB: [birth year], 41 y.o.   MRN: [id number] ? ?Chief Complaint  ?Patient presents with  ? Back Pain  ?  Pt c/o back pain for years but standing on her feet a lot is causing muscle spasms. Has tried Ibuprofen, heat massages and stretching which does help.  ? ?HPI: ?Pain ?She reports chronic lumbar pain. There was not an injury that may have caused the pain. The pain started  many years ago and is gradually worsening. The pain does not radiate. The pain is described as aching, soreness, and stabbing, is moderate in intensity, occurring intermittently. Symptoms are worse in the: afternoon, evening, nighttime  ?Aggravating factors: standing and walking She has tried application of ice and NSAIDs with little relief.  ? ?There are no preventive care reminders to display for this patient. ? ?Past Medical History:  ?Diagnosis Date  ? Ankle fracture, right   ? Anxiety   ? Arthritis   ? "in my back" (04/08/2015)  ? Chronic lower back pain   ? Complication of anesthesia   ? difficulty urinating after anesthesia  ? Headache   ? "monthly" (04/08/2015)  ? Lumbar pain with radiation down both legs 09/11/2013  ? Post partum depression   ? Preventative health care 10/29/2014  ? ? ?Past Surgical History:  ?Procedure Laterality Date  ? ANTERIOR FUSION LUMBAR SPINE  07/06/2017  ? BREAST SURGERY    ? CESAREAN SECTION  2007 and 2009  ? COMBINED ABDOMINOPLASTY AND LIPOSUCTION  12/2014  ? "liposuction in thighs"  ? FRACTURE SURGERY    ? LAPAROSCOPIC ABDOMINAL EXPLORATION  1997  ? "to check for endometriosis"  ? LAPAROSCOPIC CHOLECYSTECTOMY  2007  ? OPEN REDUCTION INTERNAL FIXATION (ORIF) TIBIA/FIBULA FRACTURE Right 04/08/2015  ? Procedure: OPEN REDUCTION INTERNAL FIXATION (ORIF)  RIGHT TIBIA PILON FRACTURE;  Surgeon: Wylene Simmer, MD;  Location: [place];  Service: Orthopedics;  Laterality: Right;  ? TIBIA FRACTURE SURGERY Right 04/08/2015  ? ORIF tibial pilon  ? ? ?Outpatient Medications Prior to  Visit  ?Medication Sig Dispense Refill  ? lamoTRIgine (LAMICTAL) 200 MG tablet Take 1 tablet (200 mg total) by mouth at bedtime. 30 tablet 0  ? LATUDA 60 MG TABS SMARTSIG:1 Tablet(s) By Mouth Every Evening    ? levonorgestrel (MIRENA, 52 MG,) 20 MCG/DAY IUD by Intrauterine route.    ? LORazepam (ATIVAN) 1 MG tablet Take 1 mg by mouth daily as needed.    ? valACYclovir (VALTREX) 500 MG tablet For suppression: Take 1 pill daily, then 1 week prior to your menstrual cycle take 2 pills daily, then 2 days after your cycle reduce to 1 pill daily. For active episode: Take 4 tablets (total 2000 mg) by mouth q12h x 1 day; Start ASAP after symptom onset. 60 tablet 5  ? venlafaxine XR (EFFEXOR-XR) 75 MG 24 hr capsule Take 3 capsules (225 mg total) by mouth at bedtime. 90 capsule 0  ? ?No facility-administered medications prior to visit.  ? ? ?Allergies  ?Allergen Reactions  ? Ciprofloxacin Hives  ? Lexapro [Escitalopram] Nausea And Vomiting  ? Trintellix [Vortioxetine]   ? Amoxicillin Hives and Other (See Comments)  ?  Has patient had a PCN reaction causing immediate rash, facial/tongue/throat swelling, SOB or lightheadedness with hypotension: no ?Has patient had a PCN reaction causing severe rash involving mucus membranes or skin necrosis: no ?Has patient had a PCN reaction that required hospitalization: no ?Has patient had a  PCN reaction occurring within the last 10 years: no - childhood reaction ?If all of the above answers are "NO", then may proceed with Cephalosporin use. ?  ? Ceclor [Cefaclor] Hives  ? Erythromycin Base Rash  ? Penicillins Hives and Other (See Comments)  ?  Has patient had a PCN reaction causing immediate rash, facial/tongue/throat swelling, SOB or lightheadedness with hypotension: no ?Has patient had a PCN reaction causing severe rash involving mucus membranes or skin necrosis: no ?Has patient had a PCN reaction that required hospitalization: no ?Has patient had a PCN reaction occurring within the last  10 years: no - childhood reaction ?If all of the above answers are "NO", then may proceed with Cephalosporin use.  ? Phenergan [Promethazine Hcl] Hives  ? Sulfa Antibiotics Hives  ? Zithromax [Azithromycin] Hives  ? ? ? ?   ?Objective:  ?  ?Physical Exam ?Vitals and nursing note reviewed.  ?Constitutional:   ?   Appearance: Normal appearance.  ?Cardiovascular:  ?   Rate and Rhythm: Normal rate and regular rhythm.  ?Pulmonary:  ?   Effort: Pulmonary effort is normal.  ?   Breath sounds: Normal breath sounds.  ?Musculoskeletal:  ?   Lumbar back: Decreased range of motion.  ?Skin: ?   General: Skin is warm and dry.  ?Neurological:  ?   Mental Status: She is alert.  ?Psychiatric:     ?   Mood and Affect: Mood normal.     ?   Behavior: Behavior normal.  ? ? ?BP 109/78 (BP Location: Left Arm, Patient Position: Sitting, Cuff Size: Large)   Pulse 81   Temp 98.3 ?F (36.8 ?C) (Temporal)   Ht '5\' 5"'$  (1.651 m)   Wt 233 lb (105.7 kg)   LMP 12/13/2021 (Exact Date)   SpO2 97%   BMI 38.77 kg/m?  ?Wt Readings from Last 3 Encounters:  ?12/16/21 233 lb (105.7 kg)  ?11/30/21 231 lb 3.2 oz (104.9 kg)  ?11/18/21 228 lb 12.8 oz (103.8 kg)  ? ? ?   ?Assessment & Plan:  ? ?Problem List Items Addressed This Visit   ? ?  ? Musculoskeletal and Integument  ? Lumbar discogenic pain syndrome - Primary  ?  Chronic - pt having more pain lately due to her job which requires a lot of standing and walking. Taking Ibuprofen daily. Sending Mobic and Robaxin, advised to stop the Ibuprofen. Also recommend trying OTC Lidocaine patches. f/u prn. ?  ?  ? Relevant Medications  ? methocarbamol (ROBAXIN) 500 MG tablet  ? meloxicam (MOBIC) 7.5 MG tablet  ? ? ?Meds ordered this encounter  ?Medications  ? methocarbamol (ROBAXIN) 500 MG tablet  ?  Sig: Take 1-2 tablets (500-1,000 mg total) by mouth every 8 (eight) hours as needed for muscle spasms.  ?  Dispense:  120 tablet  ?  Refill:  2  ?  Order Specific Question:   Supervising Provider  ?  Answer:   ANDY,  CAMILLE L [[id number]]  ? meloxicam (MOBIC) 7.5 MG tablet  ?  Sig: Take 1-2 tablets (7.5-15 mg total) by mouth daily. Do NOT take Ibuprofen with this.  ?  Dispense:  60 tablet  ?  Refill:  0  ?  Order Specific Question:   Supervising Provider  ?  Answer:   ANDY, CAMILLE L [[id number]]  ? ? ?Jeanie Sewer, NP ? ?

## 2021-12-16 NOTE — Assessment & Plan Note (Signed)
Chronic - pt having more pain lately due to her job which requires a lot of standing and walking. Taking Ibuprofen daily. Sending Mobic and Robaxin, advised to stop the Ibuprofen. Also recommend trying OTC Lidocaine patches. f/u prn. ?

## 2022-01-02 ENCOUNTER — Other Ambulatory Visit: Payer: Self-pay | Admitting: Family

## 2022-01-02 DIAGNOSIS — M5126 Other intervertebral disc displacement, lumbar region: Secondary | ICD-10-CM

## 2022-05-19 DIAGNOSIS — F314 Bipolar disorder, current episode depressed, severe, without psychotic features: Secondary | ICD-10-CM | POA: Diagnosis not present

## 2022-05-19 DIAGNOSIS — F411 Generalized anxiety disorder: Secondary | ICD-10-CM | POA: Diagnosis not present

## 2022-05-19 DIAGNOSIS — F4310 Post-traumatic stress disorder, unspecified: Secondary | ICD-10-CM | POA: Diagnosis not present

## 2022-08-19 ENCOUNTER — Ambulatory Visit: Payer: BC Managed Care – PPO | Admitting: Family

## 2023-02-14 ENCOUNTER — Encounter: Payer: Self-pay | Admitting: Family

## 2023-02-14 ENCOUNTER — Ambulatory Visit: Payer: Medicaid Other | Admitting: Family

## 2023-02-14 ENCOUNTER — Other Ambulatory Visit (HOSPITAL_COMMUNITY)
Admission: RE | Admit: 2023-02-14 | Discharge: 2023-02-14 | Disposition: A | Discharge status: Home / Self Care | Payer: Medicaid Other | Source: Ambulatory Visit | Attending: Family | Admitting: Family

## 2023-02-14 VITALS — BP 117/81 | HR 75 | Temp 97.7°F | Ht 65.0 in | Wt 240.0 lb

## 2023-02-14 DIAGNOSIS — Z124 Encounter for screening for malignant neoplasm of cervix: Secondary | ICD-10-CM

## 2023-02-14 DIAGNOSIS — M5126 Other intervertebral disc displacement, lumbar region: Secondary | ICD-10-CM | POA: Diagnosis not present

## 2023-02-14 DIAGNOSIS — Z833 Family history of diabetes mellitus: Secondary | ICD-10-CM

## 2023-02-14 DIAGNOSIS — B009 Herpesviral infection, unspecified: Secondary | ICD-10-CM | POA: Diagnosis not present

## 2023-02-14 LAB — POCT GLYCOSYLATED HEMOGLOBIN (HGB A1C): Hemoglobin A1C: 5.2 % (ref 4.0–5.6)

## 2023-02-14 LAB — GLUCOSE, POCT (MANUAL RESULT ENTRY): POC Glucose: 96 mg/dl (ref 70–99)

## 2023-02-14 MED ORDER — METHOCARBAMOL 500 MG PO TABS: 500.0000 mg | ORAL_TABLET | Freq: Three times a day (TID) | ORAL | 2 refills | Status: AC | PRN

## 2023-02-14 MED ORDER — VALACYCLOVIR HCL 500 MG PO TABS: ORAL_TABLET | ORAL | 5 refills | Status: DC

## 2023-02-14 NOTE — Assessment & Plan Note (Signed)
Chronic, stable taking Methocarbamol 500-1,000mg  tid prn, Ibuprofen prn sending refill continue to advised on daily exercise, stretching, wt loss to decrease sx f/u 6mos

## 2023-02-14 NOTE — Progress Notes (Signed)
Patient ID: Gina James, female    DOB: 12/11/1980, 42 y.o.   MRN: [id number]  Chief Complaint  Patient presents with   Gynecologic Exam   Diabetes    Discuss type 2 diabetes, family hx.    Back Pain    HPI: Family hx of diabetes:  pt has family hx of diabetes, parents and sister. Pt has had a 7lb weight gain, denies sx of polyphagia, polydipsia or polyuria. Reports just overall not feeling well. Sexually Transmitted Disease: Patient presents for sexually transmitted disease follow up. Sexual history reviewed with the patient. STD exposure: none recently.  Previous history of STD:  HSV. Current symptoms include none.  Contraception: IUD.  PAP smear recheck:  PAP done last year, HPV positive for non-high risk HPV, advised to recheck in 1 yr. Pain She reports chronic lumbar pain. There was not an injury that may have caused the pain. The pain started many years ago and is gradually worsening. The pain does not radiate. The pain is described as aching, soreness, and stabbing, is moderate in intensity, occurring intermittently. Symptoms are worse in the: afternoon, evening, nighttime  Aggravating factors: standing and walking. Pt taking Robaxin most days with relief, occasionally Ibuprofen also.  Assessment & Plan:  Lumbar discogenic pain syndrome Assessment & Plan: Chronic, stable taking Methocarbamol 500-1,000mg  tid prn, Ibuprofen prn sending refill continue to advised on daily exercise, stretching, wt loss to decrease sx f/u 6mos  Orders: -     Methocarbamol; Take 1-2 tablets (500-1,000 mg total) by mouth every 8 (eight) hours as needed for muscle spasms.  Dispense: 120 tablet; Refill: 2  HSV-2 (herpes simplex virus 2) infection Assessment & Plan: Chronic taking Valtrex qd preventatively sending refill of Valtrex 500mg  qd f/u 6 mos  Orders: -     valACYclovir HCl; For suppression: Take 1 pill daily, then 1 week prior to your menstrual cycle take 2 pills daily, then 2 days  after your cycle reduce to 1 pill daily. For active episode: Take 4 tablets (total 2000 mg) by mouth q12h x 1 day; Start ASAP after symptom onset.  Dispense: 60 tablet; Refill: 5  Encounter for Pap smear of cervix with HPV DNA cotesting -     Cytology - PAP  Family history of diabetes mellitus in sister - CBG 96 and A1C 5.2, advised pt she does not have T2DM and advised on preventive measures including low carb diet, losing weight, exercise. Handout provided.  -     POCT glycosylated hemoglobin (Hb A1C) -     POCT glucose (manual entry)  Subjective:    Outpatient Medications Prior to Visit  Medication Sig Dispense Refill   LATUDA 60 MG TABS SMARTSIG:1 Tablet(s) By Mouth Every Evening     levonorgestrel (MIRENA, 52 MG,) 20 MCG/DAY IUD by Intrauterine route.     LORazepam (ATIVAN) 1 MG tablet Take 1 mg by mouth daily as needed.     meloxicam (MOBIC) 7.5 MG tablet TAKE 1 TO 2 TABLETS(7.5 TO 15 MG) BY MOUTH DAILY. DO NOT TAKE IBUPROFEN WITH THIS 60 tablet 0   methocarbamol (ROBAXIN) 500 MG tablet Take 1-2 tablets (500-1,000 mg total) by mouth every 8 (eight) hours as needed for muscle spasms. 120 tablet 2   valACYclovir (VALTREX) 500 MG tablet For suppression: Take 1 pill daily, then 1 week prior to your menstrual cycle take 2 pills daily, then 2 days after your cycle reduce to 1 pill daily. For active episode: Take 4 tablets (total 2000  mg) by mouth q12h x 1 day; Start ASAP after symptom onset. 60 tablet 5   lamoTRIgine (LAMICTAL) 200 MG tablet Take 1 tablet (200 mg total) by mouth at bedtime. 30 tablet 0   venlafaxine XR (EFFEXOR-XR) 75 MG 24 hr capsule Take 3 capsules (225 mg total) by mouth at bedtime. 90 capsule 0   No facility-administered medications prior to visit.   Past Medical History:  Diagnosis Date   Ankle fracture, right    Anxiety    Arthritis    "in my back" (04/08/2015)   Cervical disc disorder with radiculopathy of cervical region 10/21/2017   Cervical disc herniation  12/02/2017   Chronic lower back pain    Complication of anesthesia    difficulty urinating after anesthesia   Headache    "monthly" (04/08/2015)   Lumbar pain with radiation down both legs 09/11/2013   Lumbar radiculopathy 04/01/2017   Formatting of this note might be different from the original.  Added automatically from request for surgery [id number]   Neck pain 12/02/2017   Post partum depression    Preventative health care 10/29/2014   S/P spinal surgery 08/12/2017   Past Surgical History:  Procedure Laterality Date   ANTERIOR FUSION LUMBAR SPINE  07/06/2017   BREAST SURGERY     CESAREAN SECTION  2007 and 2009   COMBINED ABDOMINOPLASTY AND LIPOSUCTION  12/2014   "liposuction in thighs"   FRACTURE SURGERY     LAPAROSCOPIC ABDOMINAL EXPLORATION  1997   "to check for endometriosis"   LAPAROSCOPIC CHOLECYSTECTOMY  2007   OPEN REDUCTION INTERNAL FIXATION (ORIF) TIBIA/FIBULA FRACTURE Right 04/08/2015   Procedure: OPEN REDUCTION INTERNAL FIXATION (ORIF)  RIGHT TIBIA PILON FRACTURE;  Surgeon: Toni Arthurs, MD;  Location: [facility];  Service: Orthopedics;  Laterality: Right;   TIBIA FRACTURE SURGERY Right 04/08/2015   ORIF tibial pilon   Allergies  Allergen Reactions   Ciprofloxacin Hives   Lexapro [Escitalopram] Nausea And Vomiting   Trintellix [Vortioxetine]    Amoxicillin Hives and Other (See Comments)    Has patient had a PCN reaction causing immediate rash, facial/tongue/throat swelling, SOB or lightheadedness with hypotension: no Has patient had a PCN reaction causing severe rash involving mucus membranes or skin necrosis: no Has patient had a PCN reaction that required hospitalization: no Has patient had a PCN reaction occurring within the last 10 years: no - childhood reaction If all of the above answers are "NO", then may proceed with Cephalosporin use.    Ceclor [Cefaclor] Hives   Erythromycin Base Rash   Penicillins Hives and Other (See Comments)    Has patient had a PCN reaction  causing immediate rash, facial/tongue/throat swelling, SOB or lightheadedness with hypotension: no Has patient had a PCN reaction causing severe rash involving mucus membranes or skin necrosis: no Has patient had a PCN reaction that required hospitalization: no Has patient had a PCN reaction occurring within the last 10 years: no - childhood reaction If all of the above answers are "NO", then may proceed with Cephalosporin use.   Phenergan [Promethazine Hcl] Hives   Sulfa Antibiotics Hives   Zithromax [Azithromycin] Hives      Objective:    Physical Exam Vitals and nursing note reviewed. Exam conducted with a chaperone present.  Constitutional:      Appearance: Normal appearance. She is obese.  Cardiovascular:     Rate and Rhythm: Normal rate and regular rhythm.  Pulmonary:     Effort: Pulmonary effort is normal.     Breath  sounds: Normal breath sounds.  Genitourinary:    Exam position: Lithotomy position.     Pubic Area: No rash or pubic lice.      Labia:        Right: No rash.        Left: No rash.      Vagina: Normal.     Cervix: Normal.     Comments: PAP smear specimen obtained for testing, IUD strings visualized. Musculoskeletal:        General: Normal range of motion.  Skin:    General: Skin is warm and dry.  Neurological:     Mental Status: She is alert.  Psychiatric:        Mood and Affect: Mood normal.        Behavior: Behavior normal.    BP 117/81 (BP Location: Left Arm, Patient Position: Sitting, Cuff Size: Large)   Pulse 75   Temp 97.7 F (36.5 C) (Temporal)   Ht 5\' 5"  (1.651 m)   Wt 240 lb (108.9 kg)   LMP 01/17/2023 (Exact Date)   SpO2 98%   BMI 39.94 kg/m  Wt Readings from Last 3 Encounters:  02/14/23 240 lb (108.9 kg)  12/16/21 233 lb (105.7 kg)  11/30/21 231 lb 3.2 oz (104.9 kg)       Dulce Sellar, NP

## 2023-02-14 NOTE — Assessment & Plan Note (Signed)
Chronic taking Valtrex qd preventatively sending refill of Valtrex 500mg  qd f/u 6 mos

## 2023-02-14 NOTE — Patient Instructions (Addendum)
It was very nice to see you today!  Your PAP results should be ready in a few days.  Your blood sugar is 96 today (fasting) and your A1C (3 mo average) of your blood sugar is 5.2 which is in normal range.  No diabetes at this time, but try to work on losing some weight and eating a diabetic diet for overall good health. See the handout attached.  Have a great week!      PLEASE NOTE:  If you had any lab tests please let us know if you have not heard back within a few days. You may see your results on MyChart before we have a chance to review them but we will give you a call once they are reviewed by [place]. If we ordered any referrals today, please let us know if you have not heard from their office within the next week.

## 2023-02-15 ENCOUNTER — Other Ambulatory Visit: Payer: Self-pay | Admitting: Family

## 2023-02-15 DIAGNOSIS — B009 Herpesviral infection, unspecified: Secondary | ICD-10-CM

## 2023-02-16 ENCOUNTER — Encounter: Payer: Self-pay | Admitting: Family

## 2023-02-16 ENCOUNTER — Other Ambulatory Visit: Payer: Self-pay

## 2023-02-16 DIAGNOSIS — B009 Herpesviral infection, unspecified: Secondary | ICD-10-CM

## 2023-02-16 MED ORDER — VALACYCLOVIR HCL 500 MG PO TABS: ORAL_TABLET | ORAL | 5 refills | Status: AC

## 2023-02-17 LAB — CYTOLOGY - PAP
Adequacy: ABSENT
Comment: NEGATIVE
Diagnosis: NEGATIVE
High risk HPV: NEGATIVE

## 2023-03-20 ENCOUNTER — Other Ambulatory Visit: Payer: Self-pay

## 2023-03-20 ENCOUNTER — Encounter (HOSPITAL_BASED_OUTPATIENT_CLINIC_OR_DEPARTMENT_OTHER): Payer: Self-pay

## 2023-03-20 ENCOUNTER — Emergency Department (HOSPITAL_BASED_OUTPATIENT_CLINIC_OR_DEPARTMENT_OTHER)
Admission: EM | Admit: 2023-03-20 | Discharge: 2023-03-20 | Disposition: A | Discharge status: Home / Self Care | Payer: MEDICAID | Attending: Emergency Medicine | Admitting: Emergency Medicine

## 2023-03-20 DIAGNOSIS — G43909 Migraine, unspecified, not intractable, without status migrainosus: Secondary | ICD-10-CM | POA: Diagnosis present

## 2023-03-20 DIAGNOSIS — G43809 Other migraine, not intractable, without status migrainosus: Secondary | ICD-10-CM | POA: Diagnosis not present

## 2023-03-20 MED ORDER — KETOROLAC TROMETHAMINE 30 MG/ML IJ SOLN
30.0000 mg | Freq: Once | INTRAMUSCULAR | Status: AC
  Administered 2023-03-20: 30 mg via INTRAVENOUS
  Filled 2023-03-20: qty 1

## 2023-03-20 MED ORDER — PROCHLORPERAZINE EDISYLATE 10 MG/2ML IJ SOLN
10.0000 mg | Freq: Once | INTRAMUSCULAR | Status: AC
  Administered 2023-03-20: 10 mg via INTRAVENOUS
  Filled 2023-03-20: qty 2

## 2023-03-20 MED ORDER — SODIUM CHLORIDE 0.9 % IV BOLUS
1000.0000 mL | Freq: Once | INTRAVENOUS | Status: AC
  Administered 2023-03-20: 1000 mL via INTRAVENOUS

## 2023-03-20 MED ORDER — DIPHENHYDRAMINE HCL 50 MG/ML IJ SOLN
25.0000 mg | Freq: Once | INTRAMUSCULAR | Status: AC
  Administered 2023-03-20: 25 mg via INTRAVENOUS
  Filled 2023-03-20: qty 1

## 2023-03-20 NOTE — ED Provider Notes (Signed)
[place] EMERGENCY DEPARTMENT AT [facility] Provider Note   CSN: [id number] Arrival date & time: 03/20/23  0107     History  Chief Complaint  Patient presents with   Migraine    Gina James is a 42 y.o. female.  HPI     This is a 42 year old female who presents with concern for migraine.  Has a history of migraines.  Reports onset of symptoms yesterday.  She reports headache that is classic for migraine and bitemporal.  She rates her pain at 10 out of 10.  She took Tylenol, ibuprofen, muscle relaxer, and caffeine with no relief.  Reports photosensitivity.  Denies weakness, numbness, strokelike symptoms.  No fevers or neck stiffness.  Home Medications Prior to Admission medications   Medication Sig Start Date End Date Taking? Authorizing Provider  lamoTRIgine (LAMICTAL) 200 MG tablet Take 1 tablet (200 mg total) by mouth at bedtime. 05/03/20 12/16/21  Karsten Ro, MD  LATUDA 60 MG TABS SMARTSIG:1 Tablet(s) By Mouth Every Evening 08/17/21   [provider]  levonorgestrel (MIRENA, 52 MG,) 20 MCG/DAY IUD by Intrauterine route.    [provider]  LORazepam (ATIVAN) 1 MG tablet Take 1 mg by mouth daily as needed. 08/14/21   [provider]  meloxicam (MOBIC) 7.5 MG tablet TAKE 1 TO 2 TABLETS(7.5 TO 15 MG) BY MOUTH DAILY. DO NOT TAKE IBUPROFEN WITH THIS 01/04/22   Dulce Sellar, NP  methocarbamol (ROBAXIN) 500 MG tablet Take 1-2 tablets (500-1,000 mg total) by mouth every 8 (eight) hours as needed for muscle spasms. 02/14/23   Dulce Sellar, NP  valACYclovir (VALTREX) 500 MG tablet For suppression: Take 1 pill daily, then 1 week prior to your menstrual cycle take 2 pills daily, then 2 days after your cycle reduce to 1 pill daily. For active episode: Take 4 tablets (total 2000 mg) by mouth q12h x 1 day; Start ASAP after symptom onset. 02/16/23   Dulce Sellar, NP  venlafaxine XR (EFFEXOR-XR) 75 MG 24 hr capsule Take 3 capsules (225 mg  total) by mouth at bedtime. 05/03/20 12/16/21  Karsten Ro, MD      Allergies    Ciprofloxacin, Lexapro [escitalopram], Trintellix [vortioxetine], Amoxicillin, Ceclor [cefaclor], Erythromycin base, Penicillins, Phenergan [promethazine hcl], Sulfa antibiotics, and Zithromax [azithromycin]    Review of Systems   Review of Systems  Constitutional:  Negative for fever.  Eyes:  Positive for photophobia.  Neurological:  Positive for headaches.  All other systems reviewed and are negative.   Physical Exam Updated Vital Signs BP 119/80   Pulse 72   Temp 98.1 F (36.7 C) (Oral)   Resp 16   Ht 1.651 m (5\' 5" )   Wt 108.9 kg   SpO2 98%   BMI 39.94 kg/m  Physical Exam Vitals and nursing note reviewed.  Constitutional:      Appearance: She is well-developed. She is not ill-appearing.  HENT:     Head: Normocephalic and atraumatic.  Eyes:     Pupils: Pupils are equal, round, and reactive to light.  Cardiovascular:     Rate and Rhythm: Normal rate and regular rhythm.     Heart sounds: Normal heart sounds.  Pulmonary:     Effort: Pulmonary effort is normal. No respiratory distress.  Abdominal:     Palpations: Abdomen is soft.  Musculoskeletal:     Cervical back: Neck supple.  Skin:    General: Skin is warm and dry.  Neurological:     Mental Status: She is alert  and oriented to person, place, and time.     Comments: Cranial nerves II through XII intact, 5 out of 5 strength in all 4 extremities  Psychiatric:        Mood and Affect: Mood normal.     ED Results / Procedures / Treatments   Labs (all labs ordered are listed, but only abnormal results are displayed) Labs Reviewed - No data to display  EKG None  Radiology No results found.  Procedures Procedures    Medications Ordered in ED Medications  sodium chloride 0.9 % bolus 1,000 mL (0 mLs Intravenous Stopped 03/20/23 0307)  prochlorperazine (COMPAZINE) injection 10 mg (10 mg Intravenous Given 03/20/23 0159)   diphenhydrAMINE (BENADRYL) injection 25 mg (25 mg Intravenous Given 03/20/23 0200)  ketorolac (TORADOL) 30 MG/ML injection 30 mg (30 mg Intravenous Given 03/20/23 0200)    ED Course/ Medical Decision Making/ A&P                             Medical Decision Making Risk Prescription drug management.   This patient presents to the ED for concern of headache, this involves an extensive number of treatment options, and is a complaint that carries with it a high risk of complications and morbidity.  I considered the following differential and admission for this acute, potentially life threatening condition.  The differential diagnosis includes migraine headache, tension headache, less likely infectious etiology such as meningitis, less likely subarachnoid  MDM:    This is a 42 year old female who presents with a headache.  Reports headache is consistent with her prior migraines.  No red flags.  Patient was given a migraine cocktail.  On recheck, she states she feels much better.  Do not feel she needs advanced imaging as I have low suspicion for subarachnoid hemorrhage.  She has no fever and no other signs or symptoms of infection so I have low suspicion for meningitis.  Will have her follow-up with PCP.  (Labs, imaging, consults)  Labs: I Ordered, and personally interpreted labs.  The pertinent results include: None  Imaging Studies ordered: I ordered imaging studies including none I independently visualized and interpreted imaging. I agree with the radiologist interpretation  Additional history obtained from chart review.  External records from outside source obtained and reviewed including prior evaluations Cardiac Monitoring: The patient was maintained on a cardiac monitor.  If on the cardiac monitor, I personally viewed and interpreted the cardiac monitored which showed an underlying rhythm of: Sinus rhythm  Reevaluation: After the interventions noted above, I reevaluated the patient  and found that they have :improved  Social Determinants of Health:  lives independently  Disposition: Discharge  Co morbidities that complicate the patient evaluation  Past Medical History:  Diagnosis Date   Ankle fracture, right    Anxiety    Arthritis    "in my back" (04/08/2015)   Cervical disc disorder with radiculopathy of cervical region 10/21/2017   Cervical disc herniation 12/02/2017   Chronic lower back pain    Complication of anesthesia    difficulty urinating after anesthesia   Headache    "monthly" (04/08/2015)   Lumbar pain with radiation down both legs 09/11/2013   Lumbar radiculopathy 04/01/2017   Formatting of this note might be different from the original.  Added automatically from request for surgery [id number]   Neck pain 12/02/2017   Post partum depression    Preventative health care 10/29/2014   S/P spinal surgery  08/12/2017     Medicines Meds ordered this encounter  Medications   sodium chloride 0.9 % bolus 1,000 mL   prochlorperazine (COMPAZINE) injection 10 mg   diphenhydrAMINE (BENADRYL) injection 25 mg   ketorolac (TORADOL) 30 MG/ML injection 30 mg    I have reviewed the patients home medicines and have made adjustments as needed  Problem List / ED Course: Problem List Items Addressed This Visit   None Visit Diagnoses     Other migraine without status migrainosus, not intractable    -  Primary   Relevant Medications   ketorolac (TORADOL) 30 MG/ML injection 30 mg (Completed)                   Final Clinical Impression(s) / ED Diagnoses Final diagnoses:  Other migraine without status migrainosus, not intractable    Rx / DC Orders ED Discharge Orders     None         Shon Baton, MD 03/20/23 0310

## 2023-03-20 NOTE — Discharge Instructions (Signed)
You were seen today for migraine headache.  This improved with cocktail in the emergency department.  Follow-up with your primary doctor.

## 2023-03-20 NOTE — ED Triage Notes (Signed)
Pt POV from home reporting 10/10 migraine, hx of same. Endorses photosensitivity and blurred vision. Took tylenol, ibuprofen, muscle relaxer, and caffeine PTA with no improvement

## 2023-03-20 NOTE — ED Notes (Signed)
Reviewed AVS with patient, patient expressed understanding of directions, denies further questions at this time. 

## 2023-04-28 ENCOUNTER — Encounter (INDEPENDENT_AMBULATORY_CARE_PROVIDER_SITE_OTHER): Payer: MEDICAID | Admitting: Family

## 2023-04-28 DIAGNOSIS — B3731 Acute candidiasis of vulva and vagina: Secondary | ICD-10-CM

## 2023-05-01 DIAGNOSIS — B3731 Acute candidiasis of vulva and vagina: Secondary | ICD-10-CM

## 2023-05-01 MED ORDER — FLUCONAZOLE 150 MG PO TABS
ORAL_TABLET | ORAL | 0 refills | Status: AC
Start: 2023-05-01 — End: ?

## 2023-05-01 NOTE — Telephone Encounter (Signed)
Please see the MyChart message reply(ies) for my assessment and plan.  The patient gave consent for this Medical Advice Message and is aware that it may result in a bill to their insurance company as well as the possibility that this may result in a co-payment or deductible. They are an established patient, but are not seeking medical advice exclusively about a problem treated during an in person or video visit in the last 7 days. I did not recommend an in person or video visit within 7 days of my reply.  I spent a total of 11 minutes cumulative time within 7 days through MyChart messaging Hudnell, Stephanie, NP 

## 2023-06-07 NOTE — Progress Notes (Unsigned)
Patient ID: Gina James, female    DOB: 1981-05-22, 42 y.o.   MRN: [id number]  No chief complaint on file.   HPI:           Assessment & Plan:     Subjective:    Outpatient Medications Prior to Visit  Medication Sig Dispense Refill   fluconazole (DIFLUCAN) 150 MG tablet Take 1 pill today and the 2nd pill in 3 days. 2 tablet 0   lamoTRIgine (LAMICTAL) 200 MG tablet Take 1 tablet (200 mg total) by mouth at bedtime. 30 tablet 0   LATUDA 60 MG TABS SMARTSIG:1 Tablet(s) By Mouth Every Evening     levonorgestrel (MIRENA, 52 MG,) 20 MCG/DAY IUD by Intrauterine route.     LORazepam (ATIVAN) 1 MG tablet Take 1 mg by mouth daily as needed.     meloxicam (MOBIC) 7.5 MG tablet TAKE 1 TO 2 TABLETS(7.5 TO 15 MG) BY MOUTH DAILY. DO NOT TAKE IBUPROFEN WITH THIS 60 tablet 0   methocarbamol (ROBAXIN) 500 MG tablet Take 1-2 tablets (500-1,000 mg total) by mouth every 8 (eight) hours as needed for muscle spasms. 120 tablet 2   valACYclovir (VALTREX) 500 MG tablet For suppression: Take 1 pill daily, then 1 week prior to your menstrual cycle take 2 pills daily, then 2 days after your cycle reduce to 1 pill daily. For active episode: Take 4 tablets (total 2000 mg) by mouth q12h x 1 day; Start ASAP after symptom onset. 60 tablet 5   venlafaxine XR (EFFEXOR-XR) 75 MG 24 hr capsule Take 3 capsules (225 mg total) by mouth at bedtime. 90 capsule 0   No facility-administered medications prior to visit.   Past Medical History:  Diagnosis Date   Ankle fracture, right    Anxiety    Arthritis    "in my back" (04/08/2015)   Cervical disc disorder with radiculopathy of cervical region 10/21/2017   Cervical disc herniation 12/02/2017   Chronic lower back pain    Complication of anesthesia    difficulty urinating after anesthesia   Headache    "monthly" (04/08/2015)   Lumbar pain with radiation down both legs 09/11/2013   Lumbar radiculopathy 04/01/2017   Formatting of this note might be different from the  original.  Added automatically from request for surgery [id number]   Neck pain 12/02/2017   Post partum depression    Preventative health care 10/29/2014   S/P spinal surgery 08/12/2017   Past Surgical History:  Procedure Laterality Date   ANTERIOR FUSION LUMBAR SPINE  07/06/2017   BREAST SURGERY     CESAREAN SECTION  2007 and 2009   COMBINED ABDOMINOPLASTY AND LIPOSUCTION  12/2014   "liposuction in thighs"   FRACTURE SURGERY     LAPAROSCOPIC ABDOMINAL EXPLORATION  1997   "to check for endometriosis"   LAPAROSCOPIC CHOLECYSTECTOMY  2007   OPEN REDUCTION INTERNAL FIXATION (ORIF) TIBIA/FIBULA FRACTURE Right 04/08/2015   Procedure: OPEN REDUCTION INTERNAL FIXATION (ORIF)  RIGHT TIBIA PILON FRACTURE;  Surgeon: Toni Arthurs, MD;  Location: [facility] OR;  Service: Orthopedics;  Laterality: Right;   TIBIA FRACTURE SURGERY Right 04/08/2015   ORIF tibial pilon   Allergies  Allergen Reactions   Ciprofloxacin Hives   Lexapro [Escitalopram] Nausea And Vomiting   Trintellix [Vortioxetine]    Amoxicillin Hives and Other (See Comments)    Has patient had a PCN reaction causing immediate rash, facial/tongue/throat swelling, SOB or lightheadedness with hypotension: no Has patient had a PCN reaction causing severe rash involving mucus  membranes or skin necrosis: no Has patient had a PCN reaction that required hospitalization: no Has patient had a PCN reaction occurring within the last 10 years: no - childhood reaction If all of the above answers are "NO", then may proceed with Cephalosporin use.    Ceclor [Cefaclor] Hives   Erythromycin Base Rash   Penicillins Hives and Other (See Comments)    Has patient had a PCN reaction causing immediate rash, facial/tongue/throat swelling, SOB or lightheadedness with hypotension: no Has patient had a PCN reaction causing severe rash involving mucus membranes or skin necrosis: no Has patient had a PCN reaction that required hospitalization: no Has patient had a PCN reaction  occurring within the last 10 years: no - childhood reaction If all of the above answers are "NO", then may proceed with Cephalosporin use.   Phenergan [Promethazine Hcl] Hives   Sulfa Antibiotics Hives   Zithromax [Azithromycin] Hives      Objective:    Physical Exam Vitals and nursing note reviewed. Exam conducted with a chaperone present.  Constitutional:      Appearance: Normal appearance.  Cardiovascular:     Rate and Rhythm: Normal rate and regular rhythm.  Pulmonary:     Effort: Pulmonary effort is normal.     Breath sounds: Normal breath sounds.  Musculoskeletal:        General: Normal range of motion.  Skin:    General: Skin is warm and dry.  Neurological:     Mental Status: She is alert.  Psychiatric:        Mood and Affect: Mood normal.        Behavior: Behavior normal.    There were no vitals taken for this visit. Wt Readings from Last 3 Encounters:  03/20/23 240 lb (108.9 kg)  02/14/23 240 lb (108.9 kg)  12/16/21 233 lb (105.7 kg)       Dulce Sellar, NP

## 2023-06-08 ENCOUNTER — Encounter: Payer: Self-pay | Admitting: Family

## 2023-06-08 ENCOUNTER — Ambulatory Visit (INDEPENDENT_AMBULATORY_CARE_PROVIDER_SITE_OTHER): Payer: MEDICAID | Admitting: Family

## 2023-06-08 VITALS — BP 103/71 | HR 68 | Temp 98.0°F | Ht 65.0 in | Wt 238.4 lb

## 2023-06-08 DIAGNOSIS — R6882 Decreased libido: Secondary | ICD-10-CM

## 2023-06-08 DIAGNOSIS — R110 Nausea: Secondary | ICD-10-CM | POA: Diagnosis not present

## 2023-06-08 DIAGNOSIS — F418 Other specified anxiety disorders: Secondary | ICD-10-CM

## 2023-06-08 DIAGNOSIS — N941 Unspecified dyspareunia: Secondary | ICD-10-CM | POA: Diagnosis not present

## 2023-06-08 MED ORDER — ESTRADIOL 10 MCG VA TABS
1.0000 | ORAL_TABLET | VAGINAL | 5 refills | Status: AC
Start: 2023-06-08 — End: ?

## 2023-06-08 MED ORDER — FAMOTIDINE 40 MG PO TABS
40.0000 mg | ORAL_TABLET | Freq: Every day | ORAL | 2 refills | Status: AC
Start: 2023-06-08 — End: ?

## 2023-06-08 NOTE — Patient Instructions (Addendum)
It was very nice to see you today!   Look up Dr Wyvonne Lenz online for her products that help with labido - she has a vaginal cream - Juvia? also sells Maca powder which helps but you can find this at any local health food store or on [employer].  I have sent over generic Vagifem which is a vaginal pill to use at bedtime to help with dryness and pain with intercourse. Let me know if this is not covered or too expensive.  I also sent over generic Pepcid to help with your am nausea, take this at bedtime for the next 1-2 weeks and see if it helps. Also avoid acid in your diet including tomatoes, juices, & caffeine.  Ask your psychiatrist about adding Wellbutrin to your regimen of meds to offset the lack of labido and help avoid weight gain due to your psych meds.     PLEASE NOTE:  If you had any lab tests please let us know if you have not heard back within a few days. You may see your results on MyChart before we have a chance to review them but we will give you a call once they are reviewed by [place]. If we ordered any referrals today, please let us know if you have not heard from their office within the next week.

## 2023-06-16 ENCOUNTER — Ambulatory Visit: Payer: MEDICAID | Admitting: Family

## 2023-08-17 ENCOUNTER — Emergency Department (HOSPITAL_BASED_OUTPATIENT_CLINIC_OR_DEPARTMENT_OTHER)
Admission: EM | Admit: 2023-08-17 | Discharge: 2023-08-17 | Disposition: A | Discharge status: Home / Self Care | Payer: MEDICAID | Attending: Emergency Medicine | Admitting: Emergency Medicine

## 2023-08-17 ENCOUNTER — Other Ambulatory Visit (HOSPITAL_BASED_OUTPATIENT_CLINIC_OR_DEPARTMENT_OTHER): Payer: Self-pay

## 2023-08-17 ENCOUNTER — Encounter (HOSPITAL_BASED_OUTPATIENT_CLINIC_OR_DEPARTMENT_OTHER): Payer: Self-pay | Admitting: Emergency Medicine

## 2023-08-17 ENCOUNTER — Other Ambulatory Visit: Payer: Self-pay

## 2023-08-17 ENCOUNTER — Ambulatory Visit
Admission: RE | Admit: 2023-08-17 | Discharge: 2023-08-17 | Payer: MEDICAID | Source: Ambulatory Visit | Attending: Family Medicine | Admitting: Family Medicine

## 2023-08-17 ENCOUNTER — Emergency Department (HOSPITAL_BASED_OUTPATIENT_CLINIC_OR_DEPARTMENT_OTHER): Payer: MEDICAID

## 2023-08-17 VITALS — BP 124/88 | HR 75 | Temp 98.9°F | Resp 20

## 2023-08-17 DIAGNOSIS — R197 Diarrhea, unspecified: Secondary | ICD-10-CM | POA: Diagnosis not present

## 2023-08-17 DIAGNOSIS — R1011 Right upper quadrant pain: Secondary | ICD-10-CM | POA: Diagnosis present

## 2023-08-17 DIAGNOSIS — D72829 Elevated white blood cell count, unspecified: Secondary | ICD-10-CM | POA: Diagnosis not present

## 2023-08-17 DIAGNOSIS — R110 Nausea: Secondary | ICD-10-CM | POA: Insufficient documentation

## 2023-08-17 LAB — POCT URINALYSIS DIP (MANUAL ENTRY)
Bilirubin, UA: NEGATIVE
Glucose, UA: NEGATIVE mg/dL
Ketones, POC UA: NEGATIVE mg/dL
Leukocytes, UA: NEGATIVE
Nitrite, UA: NEGATIVE
Protein Ur, POC: NEGATIVE mg/dL
Spec Grav, UA: 1.015 (ref 1.010–1.025)
Urobilinogen, UA: 0.2 U/dL
pH, UA: 6 (ref 5.0–8.0)

## 2023-08-17 LAB — URINALYSIS, ROUTINE W REFLEX MICROSCOPIC
Bacteria, UA: NONE SEEN
Bilirubin Urine: NEGATIVE
Glucose, UA: NEGATIVE mg/dL
Ketones, ur: NEGATIVE mg/dL
Leukocytes,Ua: NEGATIVE
Nitrite: NEGATIVE
Protein, ur: NEGATIVE mg/dL
Specific Gravity, Urine: 1.018 (ref 1.005–1.030)
pH: 5.5 (ref 5.0–8.0)

## 2023-08-17 LAB — COMPREHENSIVE METABOLIC PANEL
ALT: 14 U/L (ref 0–44)
AST: 13 U/L — ABNORMAL LOW (ref 15–41)
Albumin: 4.2 g/dL (ref 3.5–5.0)
Alkaline Phosphatase: 97 U/L (ref 38–126)
Anion gap: 6 (ref 5–15)
BUN: 6 mg/dL (ref 6–20)
CO2: 28 mmol/L (ref 22–32)
Calcium: 9.1 mg/dL (ref 8.9–10.3)
Chloride: 102 mmol/L (ref 98–111)
Creatinine, Ser: 0.99 mg/dL (ref 0.44–1.00)
GFR, Estimated: >60 mL/min (ref >60)
Glucose, Bld: 76 mg/dL (ref 70–99)
Potassium: 4 mmol/L (ref 3.5–5.1)
Sodium: 136 mmol/L (ref 135–145)
Total Bilirubin: 0.3 mg/dL (ref <1.2)
Total Protein: 6.8 g/dL (ref 6.5–8.1)

## 2023-08-17 LAB — CBC
HCT: 41.6 % (ref 36.0–46.0)
Hemoglobin: 14.2 g/dL (ref 12.0–15.0)
MCH: 29.6 pg (ref 26.0–34.0)
MCHC: 34.1 g/dL (ref 30.0–36.0)
MCV: 86.8 fL (ref 80.0–100.0)
Platelets: 246 10*3/uL (ref 150–400)
RBC: 4.79 MIL/uL (ref 3.87–5.11)
RDW: 13 % (ref 11.5–15.5)
WBC: 15.7 10*3/uL — ABNORMAL HIGH (ref 4.0–10.5)
nRBC: 0 % (ref 0.0–0.2)

## 2023-08-17 LAB — PREGNANCY, URINE: Preg Test, Ur: NEGATIVE

## 2023-08-17 LAB — LIPASE, BLOOD: Lipase: 24 U/L (ref 11–51)

## 2023-08-17 MED ORDER — IOHEXOL 300 MG/ML  SOLN
100.0000 mL | Freq: Once | INTRAMUSCULAR | Status: AC | PRN
  Administered 2023-08-17: 100 mL via INTRAVENOUS

## 2023-08-17 MED ORDER — HYDROMORPHONE HCL 1 MG/ML IJ SOLN
0.5000 mg | Freq: Once | INTRAMUSCULAR | Status: AC
  Administered 2023-08-17: 0.5 mg via INTRAVENOUS
  Filled 2023-08-17: qty 1

## 2023-08-17 MED ORDER — OXYCODONE HCL 5 MG PO TABS
5.0000 mg | ORAL_TABLET | Freq: Four times a day (QID) | ORAL | 0 refills | Status: AC | PRN
  Filled 2023-08-17: qty 8, 2d supply, fill #0

## 2023-08-17 MED ORDER — ONDANSETRON 4 MG PO TBDP
4.0000 mg | ORAL_TABLET | Freq: Once | ORAL | Status: AC
  Administered 2023-08-17: 4 mg via ORAL

## 2023-08-17 MED ORDER — PANTOPRAZOLE SODIUM 40 MG PO TBEC
40.0000 mg | DELAYED_RELEASE_TABLET | Freq: Every day | ORAL | 0 refills | Status: DC
  Filled 2023-08-17: qty 30, 30d supply, fill #0

## 2023-08-17 MED ORDER — SODIUM CHLORIDE 0.9 % IV BOLUS
1000.0000 mL | Freq: Once | INTRAVENOUS | Status: AC
  Administered 2023-08-17: 1000 mL via INTRAVENOUS

## 2023-08-17 MED ORDER — SUCRALFATE 1 G PO TABS
1.0000 g | ORAL_TABLET | Freq: Three times a day (TID) | ORAL | 0 refills | Status: DC
  Filled 2023-08-17: qty 60, 15d supply, fill #0

## 2023-08-17 NOTE — ED Triage Notes (Signed)
Pt c/o generalized abd pain with diarrhea x 3 started ~1 week ago-pain to right abd and radiating to right shoulder x 2 days-pain throughout has been worse after eating-taking pepto bismol-NAD-steady gait

## 2023-08-17 NOTE — ED Provider Notes (Signed)
[place] EMERGENCY DEPARTMENT AT [facility] Provider Note   CSN: [id number] Arrival date & time: 08/17/23  1108     History  Chief Complaint  Patient presents with   Abdominal Pain    Gina James is a 42 y.o. female.  Patient presents to the emergency department today for evaluation of right upper quadrant pain.  Patient has a history of cholecystectomy performed in [place] in 2008.  She reports increasing pain starting about a week ago.  She states that she feels pressure in the right upper quadrant that is worse with eating and movement.  She states that she feels generally swollen and inflamed.  She tried Pepto-Bismol which did not help.  She has had associated nausea without vomiting.  She did have some diarrhea which resolved.  No dysuria, increased frequency urgency or hematuria noted.  No chest pain or shortness of breath.  She does take occasional Aleve but not daily.  Denies alcohol use.  She is already on famotidine for reflux but no PPI medication.  She is not a smoker but does vape.  She was referred to the emergency department by urgent care.  She did receive Zofran prior to arrival.        Home Medications Prior to Admission medications   Medication Sig Start Date End Date Taking? Authorizing Provider  oxyCODONE (OXY IR/ROXICODONE) 5 MG immediate release tablet Take 1 tablet (5 mg total) by mouth every 6 (six) hours as needed for severe pain (pain score 7-10). 08/17/23  Yes Renne Crigler, PA-C  pantoprazole (PROTONIX) 40 MG tablet Take 1 tablet (40 mg total) by mouth daily. 08/17/23  Yes Renne Crigler, PA-C  sucralfate (CARAFATE) 1 g tablet Take 1 tablet (1 g total) by mouth 4 (four) times daily -  with meals and at bedtime. 08/17/23  Yes Renne Crigler, PA-C  Estradiol 10 MCG TABS vaginal tablet Place 1 tablet (10 mcg total) vaginally as directed. Start with pill inserted nightly for 2 weeks, then reduce to 3-4 nights per week. 06/08/23   Dulce Sellar, NP  famotidine (PEPCID) 40 MG tablet Take 1 tablet (40 mg total) by mouth at bedtime. To help reduce acid overnight and prevent am nausea. 06/08/23   Dulce Sellar, NP  fluconazole (DIFLUCAN) 150 MG tablet Take 1 pill today and the 2nd pill in 3 days. 05/01/23   Dulce Sellar, NP  lamoTRIgine (LAMICTAL) 200 MG tablet Take 1 tablet (200 mg total) by mouth at bedtime. 05/03/20 12/16/21  Karsten Ro, MD  LATUDA 60 MG TABS SMARTSIG:1 Tablet(s) By Mouth Every Evening 08/17/21   [provider]  levonorgestrel (MIRENA, 52 MG,) 20 MCG/DAY IUD 1 each by Intrauterine route once. 05/28/19 05/27/24  [provider]  LORazepam (ATIVAN) 1 MG tablet Take 1 mg by mouth daily as needed. 08/14/21   [provider]  meloxicam (MOBIC) 7.5 MG tablet TAKE 1 TO 2 TABLETS(7.5 TO 15 MG) BY MOUTH DAILY. DO NOT TAKE IBUPROFEN WITH THIS 01/04/22   Dulce Sellar, NP  methocarbamol (ROBAXIN) 500 MG tablet Take 1-2 tablets (500-1,000 mg total) by mouth every 8 (eight) hours as needed for muscle spasms. 02/14/23   Dulce Sellar, NP  valACYclovir (VALTREX) 500 MG tablet For suppression: Take 1 pill daily, then 1 week prior to your menstrual cycle take 2 pills daily, then 2 days after your cycle reduce to 1 pill daily. For active episode: Take 4 tablets (total 2000 mg) by mouth q12h x 1 day; Start ASAP  after symptom onset. 02/16/23   Dulce Sellar, NP  venlafaxine XR (EFFEXOR-XR) 75 MG 24 hr capsule Take 3 capsules (225 mg total) by mouth at bedtime. 05/03/20 12/16/21  Karsten Ro, MD      Allergies    Ciprofloxacin, Lexapro [escitalopram], Trintellix [vortioxetine], Amoxicillin, Ceclor [cefaclor], Erythromycin base, Penicillins, Phenergan [promethazine hcl], Sulfa antibiotics, and Zithromax [azithromycin]    Review of Systems   Review of Systems  Physical Exam Updated Vital Signs BP 117/77 (BP Location: Right Arm)   Pulse 72   Temp 98.8 F (37.1 C)   Resp 16   Ht 5\' 5"   (1.651 m)   Wt 108.1 kg   LMP 08/16/2023 (Exact Date)   SpO2 100%   BMI 39.66 kg/m   Physical Exam Vitals and nursing note reviewed.  Constitutional:      General: She is not in acute distress.    Appearance: She is well-developed.  HENT:     Head: Normocephalic and atraumatic.     Right Ear: External ear normal.     Left Ear: External ear normal.     Nose: Nose normal.  Eyes:     Conjunctiva/sclera: Conjunctivae normal.  Cardiovascular:     Rate and Rhythm: Normal rate and regular rhythm.     Heart sounds: No murmur heard. Pulmonary:     Effort: No respiratory distress.     Breath sounds: No wheezing, rhonchi or rales.  Abdominal:     Palpations: Abdomen is soft.     Tenderness: There is abdominal tenderness in the right upper quadrant. There is no guarding or rebound. Negative signs include Murphy's sign and McBurney's sign.  Musculoskeletal:     Cervical back: Normal range of motion and neck supple.     Right lower leg: No edema.     Left lower leg: No edema.  Skin:    General: Skin is warm and dry.     Findings: No rash.  Neurological:     General: No focal deficit present.     Mental Status: She is alert. Mental status is at baseline.     Motor: No weakness.  Psychiatric:        Mood and Affect: Mood normal.     ED Results / Procedures / Treatments   Labs (all labs ordered are listed, but only abnormal results are displayed) Labs Reviewed  COMPREHENSIVE METABOLIC PANEL - Abnormal; Notable for the following components:      Result Value   AST 13 (*)    All other components within normal limits  CBC - Abnormal; Notable for the following components:   WBC 15.7 (*)    All other components within normal limits  URINALYSIS, ROUTINE W REFLEX MICROSCOPIC - Abnormal; Notable for the following components:   Hgb urine dipstick TRACE (*)    All other components within normal limits  LIPASE, BLOOD  PREGNANCY, URINE    EKG None  Radiology CT ABDOMEN PELVIS W  CONTRAST  Result Date: 08/17/2023 CLINICAL DATA:  Acute right upper quadrant abdominal pain. EXAM: CT ABDOMEN AND PELVIS WITH CONTRAST TECHNIQUE: Multidetector CT imaging of the abdomen and pelvis was performed using the standard protocol following bolus administration of intravenous contrast. RADIATION DOSE REDUCTION: This exam was performed according to the departmental dose-optimization program which includes automated exposure control, adjustment of the mA and/or kV according to patient size and/or use of iterative reconstruction technique. CONTRAST:  OMNIPAQUE IOHEXOL 300 MG/ML  SOLN COMPARISON:  June 18, 2013. FINDINGS: Lower  chest: No acute abnormality. Hepatobiliary: No focal liver abnormality is seen. Status post cholecystectomy. No biliary dilatation. Pancreas: Unremarkable. No pancreatic ductal dilatation or surrounding inflammatory changes. Spleen: Normal in size without focal abnormality. Adrenals/Urinary Tract: Adrenal glands are unremarkable. Kidneys are normal, without renal calculi, focal lesion, or hydronephrosis. Bladder is unremarkable. Stomach/Bowel: Stomach is within normal limits. Appendix appears normal. No evidence of bowel wall thickening, distention, or inflammatory changes. Vascular/Lymphatic: No significant vascular findings are present. No enlarged abdominal or pelvic lymph nodes. Reproductive: Intrauterine device is noted.  No adnexal abnormality. Other: Moderate size fat containing periumbilical hernia. No ascites. Musculoskeletal: Status post surgical anterior fusion of L5-S1. IMPRESSION: Moderate size fat containing periumbilical hernia. No acute abnormality seen in the abdomen or pelvis. Electronically Signed   By: Lupita Raider M.D.   On: 08/17/2023 14:41    Procedures Procedures    Medications Ordered in ED Medications  HYDROmorphone (DILAUDID) injection 0.5 mg (0.5 mg Intravenous Given 08/17/23 1234)  sodium chloride 0.9 % bolus 1,000 mL ( Intravenous  Stopped 08/17/23 1351)  iohexol (OMNIPAQUE) 300 MG/ML solution 100 mL (100 mLs Intravenous Contrast Given 08/17/23 1325)    ED Course/ Medical Decision Making/ A&P    Patient seen and examined. History obtained directly from patient.  Reviewed urgent care note.  UA performed at urgent care was negative.  Labs/EKG: Ordered CBC, CMP, lipase.  Imaging: Given history of cholecystectomy, CT abdomen pelvis ordered for further evaluation  Medications/Fluids: Ordered: IV Dilaudid, IV fluid bolus.   Most recent vital signs reviewed and are as follows: BP 115/70   Pulse 75   Temp 98.8 F (37.1 C)   Resp 16   Ht 5\' 5"  (1.651 m)   Wt 108.1 kg   LMP 08/16/2023 (Exact Date)   SpO2 100%   BMI 39.66 kg/m   Initial impression: Right upper quadrant abdominal pain  2:53 PM Reassessment performed. Patient appears stable, much more comfortable after pain medication.  Labs personally reviewed and interpreted including: CBC with elevated white blood cell count 15.7; CMP unremarkable; lipase normal.  UA unremarkable.  Pregnancy negative.  Imaging personally visualized and interpreted including: CT abdomen pelvis, agree negative for acute findings in the right upper quadrant, fat-containing hernia noted.  Reviewed pertinent lab work and imaging with patient at bedside. Questions answered.   Most current vital signs reviewed and are as follows: BP 115/70   Pulse 75   Temp 98.8 F (37.1 C) (Oral)   Resp 16   Ht 5\' 5"  (1.651 m)   Wt 108.1 kg   LMP 08/16/2023 (Exact Date)   SpO2 100%   BMI 39.66 kg/m   Plan: Will focus on symptom control at this point.  Will add PPI onto the patient's H2 blocker regimen.  Will trial Carafate.  Will prescribe #8 oxycodone 5 mg tablets to take in case of severe pain.  3:11 PM   Most current vital signs reviewed and are as follows: BP 117/73 (BP Location: Left Arm)   Pulse 77   Temp 98.8 F (37.1 C) (Oral)   Resp 16   Ht 5\' 5"  (1.651 m)   Wt 108.1 kg    LMP 08/16/2023 (Exact Date)   SpO2 99%   BMI 39.66 kg/m   Plan: Discharge to home.   Other home care instructions discussed: Parke Simmers diet, avoidance of NSAIDs/alcohol  ED return instructions discussed: The patient was urged to return to the Emergency Department immediately with worsening of current symptoms, worsening abdominal pain, persistent  vomiting, blood noted in stools, fever, or any other concerns. The patient verbalized understanding.   Follow-up instructions discussed: Patient encouraged to follow-up with their PCP in 3 days.                                 Medical Decision Making Amount and/or Complexity of Data Reviewed Labs: ordered. Radiology: ordered.  Risk Prescription drug management.   For this patient's complaint of abdominal pain, the following conditions were considered on the differential diagnosis: gastritis/PUD, enteritis/duodenitis, appendicitis, cholelithiasis/cholecystitis, cholangitis, pancreatitis, ruptured viscus, colitis, diverticulitis, small/large bowel obstruction, proctitis, cystitis, pyelonephritis, ureteral colic, aortic dissection, aortic aneurysm. In women, ectopic pregnancy, pelvic inflammatory disease, ovarian cysts, and tubo-ovarian abscess were also considered. Atypical chest etiologies were also considered including ACS, PE, and pneumonia.  The patient's vital signs, pertinent lab work and imaging were reviewed and interpreted as discussed in the ED course. Hospitalization was considered for further testing, treatments, or serial exams/observation. However as patient is well-appearing, has a stable exam, and reassuring studies today, I do not feel that they warrant admission at this time. This plan was discussed with the patient who verbalizes agreement and comfort with this plan and seems reliable and able to return to the Emergency Department with worsening or changing symptoms.          Final Clinical Impression(s) / ED Diagnoses Final  diagnoses:  RUQ pain    Rx / DC Orders ED Discharge Orders          Ordered    oxyCODONE (OXY IR/ROXICODONE) 5 MG immediate release tablet  Every 6 hours PRN        08/17/23 1509    pantoprazole (PROTONIX) 40 MG tablet  Daily        08/17/23 1509    sucralfate (CARAFATE) 1 g tablet  3 times daily with meals & bedtime        08/17/23 1509              Renne Crigler, PA-C 08/17/23 1512    Royanne Foots, DO 08/21/23 2032

## 2023-08-17 NOTE — ED Triage Notes (Signed)
Pt via pov from UC with RUQ abdominal pain that strated one week ago. She reports that she had diarrhea and then this pain started after resolution of the diarrhea, which lasted for 5 days. Pt reports that the pain is increased with eating and moving. Denies any urinary symptoms including hematuris. Pt alert & oriented, nad noted.

## 2023-08-17 NOTE — Discharge Instructions (Signed)
Please read and follow all provided instructions.  Your diagnoses today include:  1. RUQ pain     Tests performed today include: Blood cell counts and platelets: Elevated white blood cell count Kidney and liver function tests: Normal liver function testing Pancreas function test (called lipase) Urine test to look for infection A blood or urine test for pregnancy (women only) CT scan of the abdomen pelvis was negative showing only a hernia, unlikely related to your symptoms Vital signs. See below for your results today.   Medications prescribed:  Pantoprazole - stomach acid reducer  Pepcid (famotidine) - antihistamine  You can find this medication over-the-counter.   DO NOT exceed:  20mg  Pepcid every 12 hours  Carafate - for stomach upset and to protect your stomach  Take any prescribed medications only as directed.  Home care instructions:  Follow any educational materials contained in this packet.  Follow-up instructions: Please follow-up with your primary care provider in the next 3 days for further evaluation of your symptoms.    Return instructions:  SEEK IMMEDIATE MEDICAL ATTENTION IF: The pain does not go away or becomes severe  A temperature above 101F develops  Repeated vomiting occurs (multiple episodes)  The pain becomes localized to portions of the abdomen. The right side could possibly be appendicitis. In an adult, the left lower portion of the abdomen could be colitis or diverticulitis.  Blood is being passed in stools or vomit (bright red or black tarry stools)  You develop chest pain, difficulty breathing, dizziness or fainting, or become confused, poorly responsive, or inconsolable (young children) If you have any other emergent concerns regarding your health  Additional Information: Abdominal (belly) pain can be caused by many things. Your caregiver performed an examination and possibly ordered blood/urine tests and imaging (CT scan, x-rays, ultrasound).  Many cases can be observed and treated at home after initial evaluation in the emergency department. Even though you are being discharged home, abdominal pain can be unpredictable. Therefore, you need a repeated exam if your pain does not resolve, returns, or worsens. Most patients with abdominal pain don't have to be admitted to the hospital or have surgery, but serious problems like appendicitis and gallbladder attacks can start out as nonspecific pain. Many abdominal conditions cannot be diagnosed in one visit, so follow-up evaluations are very important.  Your vital signs today were: BP 115/70   Pulse 75   Temp 98.8 F (37.1 C) (Oral)   Resp 16   Ht 5\' 5"  (1.651 m)   Wt 108.1 kg   LMP 08/16/2023 (Exact Date)   SpO2 100%   BMI 39.66 kg/m  If your blood pressure (bp) was elevated above 135/85 this visit, please have this repeated by your doctor within one month. --------------

## 2023-08-17 NOTE — ED Provider Notes (Signed)
[facility]   [id number] 08/17/23 Arrival Time: 0908  ASSESSMENT & PLAN:  1. Right upper quadrant abdominal pain    Unclear etiology. Does not have a gallbladder. Worsening pain today with nausea. To ED via POV; stable upon discharge  Meds ordered this encounter  Medications   ondansetron (ZOFRAN-ODT) disintegrating tablet 4 mg   Results for orders placed or performed during the hospital encounter of 08/17/23  POCT urinalysis dipstick  Result Value Ref Range   Color, UA yellow yellow   Clarity, UA clear clear   Glucose, UA negative negative mg/dL   Bilirubin, UA negative negative   Ketones, POC UA negative negative mg/dL   Spec Grav, UA 6.213 0.865 - 1.025   Blood, UA trace-intact (A) negative   pH, UA 6.0 5.0 - 8.0   Protein Ur, POC negative negative mg/dL   Urobilinogen, UA 0.2 0.2 or 1.0 E.U./dL   Nitrite, UA Negative Negative   Leukocytes, UA Negative Negative     Follow-up Information     Go to  [facility] at [facility].   Specialty: Emergency Medicine Contact information: [address] [place] [address] [phone]                 Reviewed expectations re: course of current medical issues. Questions answered. Outlined signs and symptoms indicating need for more acute intervention. Patient verbalized understanding. After Visit Summary given.   SUBJECTIVE: History from: patient. Gina James is a 42 y.o. female who presents with complaint of RUQ abd pain; x sev days after diarrheal illness this past week; still with loose stools. Reports dull aching pain in RUQ post-prandially that radiates to both sides. Is waking her at night. Nausea without emesis. Reports no PO intake today. S/P cholecystectomy approx 2007 she thinks. Pepto without relief. Denies fever. Pain worse today vs yesterday. Denies urinary symptoms. Denies frequent/heavy alcohol use.  LMP: current; started  yesterday.  Past Surgical History:  Procedure Laterality Date   ANTERIOR FUSION LUMBAR SPINE  07/06/2017   BREAST SURGERY     CESAREAN SECTION  2007 and 2009   COMBINED ABDOMINOPLASTY AND LIPOSUCTION  12/2014   "liposuction in thighs"   FRACTURE SURGERY     LAPAROSCOPIC ABDOMINAL EXPLORATION  1997   "to check for endometriosis"   LAPAROSCOPIC CHOLECYSTECTOMY  2007   OPEN REDUCTION INTERNAL FIXATION (ORIF) TIBIA/FIBULA FRACTURE Right 04/08/2015   Procedure: OPEN REDUCTION INTERNAL FIXATION (ORIF)  RIGHT TIBIA PILON FRACTURE;  Surgeon: Toni Arthurs, MD;  Location: [facility] OR;  Service: Orthopedics;  Laterality: Right;   TIBIA FRACTURE SURGERY Right 04/08/2015   ORIF tibial pilon   OBJECTIVE:  Vitals:   08/17/23 0944  BP: 124/88  Pulse: 75  Resp: 20  Temp: 98.9 F (37.2 C)  TempSrc: Oral  SpO2: 97%    General appearance: alert, oriented, no acute distress but appears uncomfortable HEENT: [place]; AT; oropharynx moist Lungs: unlabored respirations Abdomen: soft; without distention; moderate RUQ TTP; normal bowel sounds; without masses or organomegaly; without guarding or rebound tenderness Back: without CVA tenderness; FROM at waist Extremities: without LE edema; symmetrical; without gross deformities Skin: warm and dry Neurologic: normal gait Psychological: alert and cooperative; normal mood and affect  Labs: Results for orders placed or performed during the hospital encounter of 08/17/23  POCT urinalysis dipstick  Result Value Ref Range   Color, UA yellow yellow   Clarity, UA clear clear   Glucose, UA negative negative mg/dL   Bilirubin, UA  negative negative   Ketones, POC UA negative negative mg/dL   Spec Grav, UA 8.657 8.469 - 1.025   Blood, UA trace-intact (A) negative   pH, UA 6.0 5.0 - 8.0   Protein Ur, POC negative negative mg/dL   Urobilinogen, UA 0.2 0.2 or 1.0 E.U./dL   Nitrite, UA Negative Negative   Leukocytes, UA Negative Negative   Labs Reviewed  POCT URINALYSIS  DIP (MANUAL ENTRY) - Abnormal; Notable for the following components:      Result Value   Blood, UA trace-intact (*)    All other components within normal limits    Imaging: No results found.   Allergies  Allergen Reactions   Ciprofloxacin Hives   Lexapro [Escitalopram] Nausea And Vomiting   Trintellix [Vortioxetine]    Amoxicillin Hives and Other (See Comments)    Has patient had a PCN reaction causing immediate rash, facial/tongue/throat swelling, SOB or lightheadedness with hypotension: no Has patient had a PCN reaction causing severe rash involving mucus membranes or skin necrosis: no Has patient had a PCN reaction that required hospitalization: no Has patient had a PCN reaction occurring within the last 10 years: no - childhood reaction If all of the above answers are "NO", then may proceed with Cephalosporin use.    Ceclor [Cefaclor] Hives   Erythromycin Base Rash   Penicillins Hives and Other (See Comments)    Has patient had a PCN reaction causing immediate rash, facial/tongue/throat swelling, SOB or lightheadedness with hypotension: no Has patient had a PCN reaction causing severe rash involving mucus membranes or skin necrosis: no Has patient had a PCN reaction that required hospitalization: no Has patient had a PCN reaction occurring within the last 10 years: no - childhood reaction If all of the above answers are "NO", then may proceed with Cephalosporin use.   Phenergan [Promethazine Hcl] Hives   Sulfa Antibiotics Hives   Zithromax [Azithromycin] Hives                                               Past Medical History:  Diagnosis Date   Ankle fracture, right    Anxiety    Arthritis    "in my back" (04/08/2015)   Cervical disc disorder with radiculopathy of cervical region 10/21/2017   Cervical disc herniation 12/02/2017   Chronic lower back pain    Complication of anesthesia    difficulty urinating after anesthesia   Headache    "monthly" (04/08/2015)   Lumbar  pain with radiation down both legs 09/11/2013   Lumbar radiculopathy 04/01/2017   Formatting of this note might be different from the original.  Added automatically from request for surgery [id number]   Neck pain 12/02/2017   Post partum depression    Preventative health care 10/29/2014   S/P spinal surgery 08/12/2017    Social History   Socioeconomic History   Marital status: Divorced    Spouse name: Not on file   Number of children: Not on file   Years of education: Not on file   Highest education level: Associate degree: occupational, Scientist, product/process development, or vocational program  Occupational History   Not on file  Tobacco Use   Smoking status: Former    Current packs/day: 0.00    Average packs/day: 0.1 packs/day for 10.0 years (1.2 ttl pk-yrs)    Types: Cigarettes    Start date: 09/06/1994  Quit date: 09/06/2004    Years since quitting: 18.9   Smokeless tobacco: Never  Vaping Use   Vaping status: Never Used  Substance and Sexual Activity   Alcohol use: Not Currently   Drug use: Yes    Types: Marijuana   Sexual activity: Yes    Birth control/protection: I.U.D.  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       Social Determinants of Health   Financial Resource Strain: High Risk (02/11/2023)   Overall Financial Resource Strain (CARDIA)    Difficulty of Paying Living Expenses: Very hard  Food Insecurity: Food Insecurity Present (02/11/2023)   Hunger Vital Sign    Worried About Running Out of Food in the Last Year: Often true    Ran Out of Food in the Last Year: Often true  Transportation Needs: No Transportation Needs (02/11/2023)   PRAPARE - Administrator, Civil Service (Medical): No    Lack of Transportation (Non-Medical): No  Physical Activity: Insufficiently Active  (02/11/2023)   Exercise Vital Sign    Days of Exercise per Week: 1 day    Minutes of Exercise per Session: 10 min  Stress: No Stress Concern Present (02/11/2023)   [employer] of Occupational Health - Occupational Stress Questionnaire    Feeling of Stress : Not at all  Social Connections: Socially Isolated (02/11/2023)   Social Connection and Isolation Panel [NHANES]    Frequency of Communication with Friends and Family: Never    Frequency of Social Gatherings with Friends and Family: Never    Attends Religious Services: Never    Database administrator or Organizations: No    Attends Engineer, structural: Not on file    Marital Status: Living with partner  Intimate Partner Violence: Unknown (05/15/2022)   Received from [employer], [facility]   HITS    Physically Hurt: Not on file    Insult or Talk Down To: Not on file    Threaten Physical Harm: Not on file    Scream or Curse: Not on file    Family History  Problem Relation Age of Onset   Diabetes Father    Cancer Sister 1       breast, had chemo/double mastectomy   Heart disease Maternal Grandfather    Heart disease Paternal Glynda Jaeger, MD 08/17/23 1054

## 2023-08-17 NOTE — ED Notes (Signed)
Pt notified of provider emergency and another provided to be here ~20 min-agreeable to wait to be seen

## 2023-08-18 ENCOUNTER — Ambulatory Visit: Payer: MEDICAID | Admitting: Family

## 2023-08-22 ENCOUNTER — Encounter: Payer: Self-pay | Admitting: Family

## 2023-08-22 ENCOUNTER — Ambulatory Visit (INDEPENDENT_AMBULATORY_CARE_PROVIDER_SITE_OTHER): Payer: MEDICAID | Admitting: Family

## 2023-08-22 VITALS — BP 105/74 | HR 67 | Temp 98.0°F | Ht 65.0 in | Wt 232.0 lb

## 2023-08-22 DIAGNOSIS — R1084 Generalized abdominal pain: Secondary | ICD-10-CM

## 2023-08-22 DIAGNOSIS — K429 Umbilical hernia without obstruction or gangrene: Secondary | ICD-10-CM | POA: Diagnosis not present

## 2023-08-22 DIAGNOSIS — R197 Diarrhea, unspecified: Secondary | ICD-10-CM | POA: Diagnosis not present

## 2023-08-22 MED ORDER — PANTOPRAZOLE SODIUM 40 MG PO TBEC
40.0000 mg | DELAYED_RELEASE_TABLET | Freq: Every day | ORAL | 2 refills | Status: DC
Start: 2023-08-22 — End: 2023-09-02

## 2023-08-22 NOTE — Progress Notes (Signed)
Patient ID: Gina James, female    DOB: [birth year], 42 y.o.   MRN: [id number]  Chief Complaint  Patient presents with   Abdominal Pain    Pt c/o abdominal pain and diarrhea, Pt was seen at [facility] on 12/11. Pt was given RX for GERD but sx has been worsening. Diarrhea and abdominal pain has been getting worse.    Discussed the use of AI scribe software for clinical note transcription with the patient, who gave verbal consent to proceed.  History of Present Illness   The patient presents with ongoing abdominal pain and nausea. She was recently evaluated in the emergency department where a CT scan was performed and was unremarkable. She was informed of a minor umbilical hernia, which she had noticed but did not know what it was. She does not believe this is the source of her pain. She has been experiencing diarrhea, initially every ten minutes, which she initially attributed to food poisoning from lettuce. She has been managing this with Pepto Bismol. She has been taking famotidine for possible GERD symptoms and recently started on pantoprazole and sucralfate from the emergency department. She reports some improvement in pain but then had a bout of diarrhea five times after eating toast with eggs. The pain is localized in the abdomen but has also spread. She has not had an ulcer before but has taken Aleve recently for a migraine and has taken a lot over the years. She has not given up coffee but is also drinking water & no other drinks due to the pain.       Assessment & Plan:     Abdominal Pain w/diarrhea - Recent onset of abdominal pain with associated nausea and diarrhea, seen in ER. CT scan unremarkable. History of GERD and long time NSAID use. Reports some improvement with carafate and Protonix, but diarrhea and pain returned. -Continue Pantoprazole and Sucralfate as prescribed. -Reduce caffeine intake and avoid acidic foods. -Sending GI referral to r/o possible peptic ulcer disease vs. food  allergy given symptoms and recent NSAID use.  Umbilical Hernia - Noted on recent CT scan and confirmed on physical exam. No associated pain or complications. -No intervention required at this time.     Subjective:    Outpatient Medications Prior to Visit  Medication Sig Dispense Refill   Estradiol 10 MCG TABS vaginal tablet Place 1 tablet (10 mcg total) vaginally as directed. Start with pill inserted nightly for 2 weeks, then reduce to 3-4 nights per week. 20 tablet 5   famotidine (PEPCID) 40 MG tablet Take 1 tablet (40 mg total) by mouth at bedtime. To help reduce acid overnight and prevent am nausea. 30 tablet 2   fluconazole (DIFLUCAN) 150 MG tablet Take 1 pill today and the 2nd pill in 3 days. 2 tablet 0   LATUDA 60 MG TABS SMARTSIG:1 Tablet(s) By Mouth Every Evening     levonorgestrel (MIRENA, 52 MG,) 20 MCG/DAY IUD 1 each by Intrauterine route once.     LORazepam (ATIVAN) 1 MG tablet Take 1 mg by mouth daily as needed.     meloxicam (MOBIC) 7.5 MG tablet TAKE 1 TO 2 TABLETS(7.5 TO 15 MG) BY MOUTH DAILY. DO NOT TAKE IBUPROFEN WITH THIS 60 tablet 0   methocarbamol (ROBAXIN) 500 MG tablet Take 1-2 tablets (500-1,000 mg total) by mouth every 8 (eight) hours as needed for muscle spasms. 120 tablet 2   oxyCODONE (OXY IR/ROXICODONE) 5 MG immediate release tablet Take 1 tablet (5 mg total)  by mouth every 6 (six) hours as needed for severe pain (pain score 7-10). 8 tablet 0   pantoprazole (PROTONIX) 40 MG tablet Take 1 tablet (40 mg total) by mouth daily. 30 tablet 0   sucralfate (CARAFATE) 1 g tablet Take 1 tablet (1 g total) by mouth 4 (four) times daily -  with meals and at bedtime. 60 tablet 0   valACYclovir (VALTREX) 500 MG tablet For suppression: Take 1 pill daily, then 1 week prior to your menstrual cycle take 2 pills daily, then 2 days after your cycle reduce to 1 pill daily. For active episode: Take 4 tablets (total 2000 mg) by mouth q12h x 1 day; Start ASAP after symptom onset. 60 tablet  5   lamoTRIgine (LAMICTAL) 200 MG tablet Take 1 tablet (200 mg total) by mouth at bedtime. 30 tablet 0   venlafaxine XR (EFFEXOR-XR) 75 MG 24 hr capsule Take 3 capsules (225 mg total) by mouth at bedtime. 90 capsule 0   No facility-administered medications prior to visit.   Past Medical History:  Diagnosis Date   Ankle fracture, right    Anxiety    Arthritis    "in my back" (04/08/2015)   Cervical disc disorder with radiculopathy of cervical region 10/21/2017   Cervical disc herniation 12/02/2017   Chronic lower back pain    Complication of anesthesia    difficulty urinating after anesthesia   Headache    "monthly" (04/08/2015)   Lumbar pain with radiation down both legs 09/11/2013   Lumbar radiculopathy 04/01/2017   Formatting of this note might be different from the original.  Added automatically from request for surgery [id number]   Neck pain 12/02/2017   Post partum depression    Preventative health care 10/29/2014   S/P spinal surgery 08/12/2017   Past Surgical History:  Procedure Laterality Date   ANTERIOR FUSION LUMBAR SPINE  07/06/2017   BREAST SURGERY     CESAREAN SECTION  2007 and 2009   COMBINED ABDOMINOPLASTY AND LIPOSUCTION  12/2014   "liposuction in thighs"   FRACTURE SURGERY     LAPAROSCOPIC ABDOMINAL EXPLORATION  1997   "to check for endometriosis"   LAPAROSCOPIC CHOLECYSTECTOMY  2007   OPEN REDUCTION INTERNAL FIXATION (ORIF) TIBIA/FIBULA FRACTURE Right 04/08/2015   Procedure: OPEN REDUCTION INTERNAL FIXATION (ORIF)  RIGHT TIBIA PILON FRACTURE;  Surgeon: Toni Arthurs, MD;  Location: [facility] OR;  Service: Orthopedics;  Laterality: Right;   TIBIA FRACTURE SURGERY Right 04/08/2015   ORIF tibial pilon   Allergies  Allergen Reactions   Ciprofloxacin Hives   Lexapro [Escitalopram] Nausea And Vomiting   Trintellix [Vortioxetine]    Amoxicillin Hives and Other (See Comments)    Has patient had a PCN reaction causing immediate rash, facial/tongue/throat swelling, SOB or  lightheadedness with hypotension: no Has patient had a PCN reaction causing severe rash involving mucus membranes or skin necrosis: no Has patient had a PCN reaction that required hospitalization: no Has patient had a PCN reaction occurring within the last 10 years: no - childhood reaction If all of the above answers are "NO", then may proceed with Cephalosporin use.    Ceclor [Cefaclor] Hives   Erythromycin Base Rash   Penicillins Hives and Other (See Comments)    Has patient had a PCN reaction causing immediate rash, facial/tongue/throat swelling, SOB or lightheadedness with hypotension: no Has patient had a PCN reaction causing severe rash involving mucus membranes or skin necrosis: no Has patient had a PCN reaction that required hospitalization: no Has patient  had a PCN reaction occurring within the last 10 years: no - childhood reaction If all of the above answers are "NO", then may proceed with Cephalosporin use.   Phenergan [Promethazine Hcl] Hives   Sulfa Antibiotics Hives   Zithromax [Azithromycin] Hives      Objective:    Physical Exam Vitals and nursing note reviewed.  Constitutional:      Appearance: Normal appearance.  Cardiovascular:     Rate and Rhythm: Normal rate and regular rhythm.  Pulmonary:     Effort: Pulmonary effort is normal.     Breath sounds: Normal breath sounds.  Musculoskeletal:        General: Normal range of motion.  Skin:    General: Skin is warm and dry.  Neurological:     Mental Status: She is alert.  Psychiatric:        Mood and Affect: Mood normal.        Behavior: Behavior normal.    BP 105/74 (BP Location: Left Arm, Patient Position: Sitting, Cuff Size: Large)   Pulse 67   Temp 98 F (36.7 C) (Temporal)   Ht 5\' 5"  (1.651 m)   Wt 232 lb (105.2 kg)   LMP 08/16/2023 (Exact Date)   SpO2 97%   BMI 38.61 kg/m  Wt Readings from Last 3 Encounters:  08/22/23 232 lb (105.2 kg)  08/17/23 238 lb 5.1 oz (108.1 kg)  06/08/23 238 lb 6 oz  (108.1 kg)       Dulce Sellar, NP

## 2023-08-26 ENCOUNTER — Encounter: Payer: Self-pay | Admitting: Family

## 2023-08-29 ENCOUNTER — Encounter: Payer: Self-pay | Admitting: Gastroenterology

## 2023-09-02 ENCOUNTER — Other Ambulatory Visit: Payer: Self-pay

## 2023-09-02 ENCOUNTER — Other Ambulatory Visit (HOSPITAL_BASED_OUTPATIENT_CLINIC_OR_DEPARTMENT_OTHER): Payer: Self-pay

## 2023-09-02 ENCOUNTER — Encounter: Payer: Self-pay | Admitting: Family

## 2023-09-02 DIAGNOSIS — R1084 Generalized abdominal pain: Secondary | ICD-10-CM

## 2023-09-02 MED ORDER — SUCRALFATE 1 G PO TABS
1.0000 g | ORAL_TABLET | Freq: Three times a day (TID) | ORAL | 0 refills | Status: AC
  Filled 2023-09-02: qty 60, 15d supply, fill #0

## 2023-09-02 MED ORDER — PANTOPRAZOLE SODIUM 40 MG PO TBEC
40.0000 mg | DELAYED_RELEASE_TABLET | Freq: Every day | ORAL | 2 refills | Status: DC
Start: 2023-09-02 — End: 2023-09-06
  Filled 2023-09-02: qty 30, 30d supply, fill #0

## 2023-09-02 NOTE — Telephone Encounter (Signed)
yes, thx

## 2023-09-06 MED ORDER — PANTOPRAZOLE SODIUM 40 MG PO TBEC
40.0000 mg | DELAYED_RELEASE_TABLET | Freq: Two times a day (BID) | ORAL | 0 refills | Status: AC
Start: 2023-09-06 — End: ?

## 2023-09-06 MED ORDER — PANTOPRAZOLE SODIUM 40 MG PO TBEC
40.0000 mg | DELAYED_RELEASE_TABLET | Freq: Two times a day (BID) | ORAL | 0 refills | Status: DC
Start: 2023-09-06 — End: 2023-09-06

## 2023-09-08 NOTE — Telephone Encounter (Signed)
 See note

## 2023-09-12 ENCOUNTER — Other Ambulatory Visit (HOSPITAL_BASED_OUTPATIENT_CLINIC_OR_DEPARTMENT_OTHER): Payer: Self-pay

## 2023-09-21 ENCOUNTER — Ambulatory Visit: Payer: MEDICAID | Admitting: Gastroenterology

## 2023-09-21 ENCOUNTER — Other Ambulatory Visit: Payer: MEDICAID

## 2023-09-21 ENCOUNTER — Encounter: Payer: Self-pay | Admitting: Gastroenterology

## 2023-09-21 VITALS — BP 102/74 | HR 97 | Ht 65.0 in | Wt 231.0 lb

## 2023-09-21 DIAGNOSIS — K219 Gastro-esophageal reflux disease without esophagitis: Secondary | ICD-10-CM

## 2023-09-21 DIAGNOSIS — R1013 Epigastric pain: Secondary | ICD-10-CM

## 2023-09-21 DIAGNOSIS — R634 Abnormal weight loss: Secondary | ICD-10-CM | POA: Diagnosis not present

## 2023-09-21 LAB — CBC WITH DIFFERENTIAL/PLATELET
Basophils Absolute: 0.1 10*3/uL (ref 0.0–0.1)
Basophils Relative: 1 % (ref 0.0–3.0)
Eosinophils Absolute: 0.9 10*3/uL — ABNORMAL HIGH (ref 0.0–0.7)
Eosinophils Relative: 10.5 % — ABNORMAL HIGH (ref 0.0–5.0)
HCT: 42.7 % (ref 36.0–46.0)
Hemoglobin: 14.1 g/dL (ref 12.0–15.0)
Lymphocytes Relative: 42.3 % (ref 12.0–46.0)
Lymphs Abs: 3.5 10*3/uL (ref 0.7–4.0)
MCHC: 33 g/dL (ref 30.0–36.0)
MCV: 89.8 fL (ref 78.0–100.0)
Monocytes Absolute: 0.4 10*3/uL (ref 0.1–1.0)
Monocytes Relative: 5.4 % (ref 3.0–12.0)
Neutro Abs: 3.4 10*3/uL (ref 1.4–7.7)
Neutrophils Relative %: 40.8 % — ABNORMAL LOW (ref 43.0–77.0)
Platelets: 258 10*3/uL (ref 150.0–400.0)
RBC: 4.76 Mil/uL (ref 3.87–5.11)
RDW: 13.6 % (ref 11.5–15.5)
WBC: 8.3 10*3/uL (ref 4.0–10.5)

## 2023-09-21 LAB — COMPREHENSIVE METABOLIC PANEL
ALT: 17 U/L (ref 0–35)
AST: 15 U/L (ref 0–37)
Albumin: 4.5 g/dL (ref 3.5–5.2)
Alkaline Phosphatase: 94 U/L (ref 39–117)
BUN: 7 mg/dL (ref 6–23)
CO2: 28 meq/L (ref 19–32)
Calcium: 9.2 mg/dL (ref 8.4–10.5)
Chloride: 103 meq/L (ref 96–112)
Creatinine, Ser: 0.93 mg/dL (ref 0.40–1.20)
GFR: 75.01 mL/min (ref >60.00)
Glucose, Bld: 82 mg/dL (ref 70–99)
Potassium: 4.1 meq/L (ref 3.5–5.1)
Sodium: 139 meq/L (ref 135–145)
Total Bilirubin: 0.4 mg/dL (ref 0.2–1.2)
Total Protein: 6.9 g/dL (ref 6.0–8.3)

## 2023-09-21 MED ORDER — LANSOPRAZOLE 30 MG PO CPDR: 30.0000 mg | DELAYED_RELEASE_CAPSULE | Freq: Every day | ORAL | 3 refills | Status: AC

## 2023-09-21 NOTE — Progress Notes (Signed)
 Chief Complaint: Epigastric pain Primary GI MD: Para Bold  HPI: 43 year old female presents for evaluation of RUQ/Epigastric pain. She has been seen at the ED 12/11 and by PCP 12/16 for same.  Workup notable for CT ab/pelvis with contrast 12/11 showing s/p cholecystectomy. No focal liver abnormality seen. Unremarkable pancreas. Normal stomach/colon. Moderate sized fat containing periumbilical hernia. Normal LFTs Normal kidney function Lipase 24 CBC with leukocytosis of 15.7 Normal UA  The patient presents with a new onset of upper abdominal pain since the beginning of December. The pain is described as a burning sensation, exacerbated by the consumption of foods with strong flavors, particularly those that are acidic, spicy, or greasy. The pain radiates to the shoulder blades. The patient has been managing the symptoms with over-the-counter Prevacid , which she reports to be more effective than the previously prescribed Pantoprazole . She also previously took Carafate  (sucralfate ) until the medication ended, and reports that it was helpful at the time.  In addition to the abdominal pain, the patient experienced a significant change in bowel habits. Initially, she had frequent, almost every seven minutes, loose stools, to the point of feeling like she was "living on the toilet." Over time, the frequency of bowel movements has decreased to once every other day, with the stool now being soft and formed.  The patient has also experienced an unplanned weight loss of twelve pounds over the past three weeks, which she attributes to a significant dietary change in response to the abdominal pain. She denies any difficulty swallowing, blood in the stool, or black stools.      Past Medical History:  Diagnosis Date   Ankle fracture, right    Anxiety    Arthritis    "in my back" (04/08/2015)   Cervical disc disorder with radiculopathy of cervical region 10/21/2017   Cervical disc herniation  12/02/2017   Chronic lower back pain    Complication of anesthesia    difficulty urinating after anesthesia   Diarrhea    Headache    "monthly" (04/08/2015)   Lumbar pain with radiation down both legs 09/11/2013   Lumbar radiculopathy 04/01/2017   Formatting of this note might be different from the original.  Added automatically from request for surgery [id number]   Neck pain 12/02/2017   Post partum depression    Preventative health care 10/29/2014   S/P spinal surgery 08/12/2017    Past Surgical History:  Procedure Laterality Date   ANTERIOR FUSION LUMBAR SPINE  07/06/2017   BREAST SURGERY     CESAREAN SECTION  2007 and 2009   COMBINED ABDOMINOPLASTY AND LIPOSUCTION  12/2014   "liposuction in thighs"   FRACTURE SURGERY     LAPAROSCOPIC ABDOMINAL EXPLORATION  1997   "to check for endometriosis"   LAPAROSCOPIC CHOLECYSTECTOMY  2007   OPEN REDUCTION INTERNAL FIXATION (ORIF) TIBIA/FIBULA FRACTURE Right 04/08/2015   Procedure: OPEN REDUCTION INTERNAL FIXATION (ORIF)  RIGHT TIBIA PILON FRACTURE;  Surgeon: Amada Backer, MD;  Location: [facility] OR;  Service: Orthopedics;  Laterality: Right;   TIBIA FRACTURE SURGERY Right 04/08/2015   ORIF tibial pilon    Current Outpatient Medications  Medication Sig Dispense Refill   Estradiol  10 MCG TABS vaginal tablet Place 1 tablet (10 mcg total) vaginally as directed. Start with pill inserted nightly for 2 weeks, then reduce to 3-4 nights per week. 20 tablet 5   famotidine  (PEPCID ) 40 MG tablet Take 1 tablet (40 mg total) by mouth at bedtime. To help reduce acid overnight and prevent am  nausea. 30 tablet 2   fluconazole  (DIFLUCAN ) 150 MG tablet Take 1 pill today and the 2nd pill in 3 days. 2 tablet 0   lansoprazole  (PREVACID ) 30 MG capsule Take 1 capsule (30 mg total) by mouth daily at 12 noon. 30 capsule 3   LATUDA 60 MG TABS SMARTSIG:1 Tablet(s) By Mouth Every Evening     levonorgestrel  (MIRENA , 52 MG,) 20 MCG/DAY IUD 1 each by Intrauterine route once.      LORazepam  (ATIVAN ) 1 MG tablet Take 1 mg by mouth daily as needed.     meloxicam  (MOBIC ) 7.5 MG tablet TAKE 1 TO 2 TABLETS(7.5 TO 15 MG) BY MOUTH DAILY. DO NOT TAKE IBUPROFEN WITH THIS 60 tablet 0   methocarbamol  (ROBAXIN ) 500 MG tablet Take 1-2 tablets (500-1,000 mg total) by mouth every 8 (eight) hours as needed for muscle spasms. 120 tablet 2   oxyCODONE  (OXY IR/ROXICODONE ) 5 MG immediate release tablet Take 1 tablet (5 mg total) by mouth every 6 (six) hours as needed for severe pain (pain score 7-10). 8 tablet 0   pantoprazole  (PROTONIX ) 40 MG tablet Take 1 tablet (40 mg total) by mouth in the morning and at bedtime. 60 tablet 0   REXULTI 1 MG TABS tablet Take 1 mg by mouth daily.     sucralfate  (CARAFATE ) 1 g tablet Take 1 tablet (1 g total) by mouth 4 (four) times daily -  with meals and at bedtime. 60 tablet 0   valACYclovir  (VALTREX ) 500 MG tablet For suppression: Take 1 pill daily, then 1 week prior to your menstrual cycle take 2 pills daily, then 2 days after your cycle reduce to 1 pill daily. For active episode: Take 4 tablets (total 2000 mg) by mouth q12h x 1 day; Start ASAP after symptom onset. 60 tablet 5   venlafaxine  XR (EFFEXOR -XR) 75 MG 24 hr capsule Take 3 capsules (225 mg total) by mouth at bedtime. 90 capsule 0   lamoTRIgine  (LAMICTAL ) 200 MG tablet Take 1 tablet (200 mg total) by mouth at bedtime. 30 tablet 0   No current facility-administered medications for this visit.    Allergies as of 09/21/2023 - Review Complete 09/21/2023  Allergen Reaction Noted   Ciprofloxacin  Hives 08/09/2017   Lexapro  [escitalopram ] Nausea And Vomiting 05/31/2017   Trintellix [vortioxetine]  02/15/2018   Amoxicillin Hives and Other (See Comments) 06/18/2013   Ceclor [cefaclor] Hives 09/03/2013   Erythromycin base Rash 12/30/2016   Penicillins Hives and Other (See Comments) 06/18/2013   Phenergan [promethazine hcl] Hives 12/17/2014   Sulfa antibiotics Hives 06/18/2013   Zithromax  [azithromycin] Hives 06/18/2013    Family History  Problem Relation Age of Onset   Diabetes Father    Cancer Sister 94       breast, had chemo/double mastectomy   Heart disease Maternal Grandfather    Heart disease Paternal Grandfather     Social History   Socioeconomic History   Marital status: Divorced    Spouse name: Not on file   Number of children: 2   Years of education: Not on file   Highest education level: Associate degree: occupational, Scientist, product/process development, or vocational program  Occupational History   Not on file  Tobacco Use   Smoking status: Former    Current packs/day: 0.00    Average packs/day: 0.1 packs/day for 10.0 years (1.2 ttl pk-yrs)    Types: Cigarettes    Start date: 09/06/1994    Quit date: 09/06/2004    Years since quitting: 19.0  Smokeless tobacco: Never  Vaping Use   Vaping status: Some Days   Substances: Nicotine  Substance and Sexual Activity   Alcohol use: Not Currently   Drug use: Yes    Types: Marijuana    Comment: daily   Sexual activity: Yes    Birth control/protection: I.U.D.  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       Social Drivers of Corporate investment banker Strain: High Risk (08/21/2023)   Overall Financial Resource Strain (CARDIA)    Difficulty of Paying Living Expenses: Very hard  Food Insecurity: Food Insecurity Present (08/21/2023)   Hunger Vital Sign    Worried About Running Out of Food in the Last Year: Often true    Ran Out of Food in the Last Year: Often true  Transportation Needs: No Transportation Needs (08/21/2023)   PRAPARE - Administrator, Civil Service (Medical): No    Lack of Transportation (Non-Medical): No  Physical Activity: Insufficiently Active (08/21/2023)   Exercise Vital Sign    Days of  Exercise per Week: 2 days    Minutes of Exercise per Session: 30 min  Stress: Stress Concern Present (08/21/2023)   [employer] of Occupational Health - Occupational Stress Questionnaire    Feeling of Stress : Very much  Social Connections: Moderately Isolated (08/21/2023)   Social Connection and Isolation Panel [NHANES]    Frequency of Communication with Friends and Family: More than three times a week    Frequency of Social Gatherings with Friends and Family: More than three times a week    Attends Religious Services: Never    Database administrator or Organizations: No    Attends Engineer, structural: Not on file    Marital Status: Living with partner  Intimate Partner Violence: Unknown (05/15/2022)   Received from [facility], [facility]   HITS    Physically Hurt: Not on file    Insult or Talk Down To: Not on file    Threaten Physical Harm: Not on file    Scream or Curse: Not on file    Review of Systems:    Constitutional: No weight loss, fever, chills, weakness or fatigue HEENT: Eyes: No change in vision               Ears, Nose, Throat:  No change in hearing or congestion Skin: No rash or itching Cardiovascular: No chest pain, chest pressure or palpitations   Respiratory: No SOB or cough Gastrointestinal: See HPI and otherwise negative Genitourinary: No dysuria or change in urinary frequency Neurological: No headache, dizziness or syncope Musculoskeletal: No new muscle or joint pain Hematologic: No bleeding or bruising Psychiatric: No history of depression or anxiety    Physical Exam:  Vital signs: BP 102/74   Pulse 97   Ht 5\' 5"  (1.651 m)   Wt 231 lb (104.8 kg)   SpO2 98%   BMI 38.44 kg/m   Constitutional: NAD, Well developed, Well nourished, alert and cooperative Head:  Normocephalic and atraumatic. Eyes:   PEERL, EOMI. No icterus. Conjunctiva pink. Respiratory: Respirations even and unlabored. Lungs clear to auscultation bilaterally.   No  wheezes, crackles, or rhonchi.  Cardiovascular:  Regular rate and rhythm. No peripheral edema, cyanosis  or pallor.  Gastrointestinal:  Soft, nondistended, nontender. No rebound or guarding. Normal bowel sounds. No appreciable masses or hepatomegaly. Rectal:  Not performed.  Msk:  Symmetrical without gross deformities. Without edema, no deformity or joint abnormality.  Neurologic:  Alert and  oriented x4;  grossly normal neurologically.  Skin:   Dry and intact without significant lesions or rashes. Psychiatric: Oriented to person, place and time. Demonstrates good judgement and reason without abnormal affect or behaviors.   RELEVANT LABS AND IMAGING: CBC    Component Value Date/Time   WBC 15.7 (H) 08/17/2023 1139   RBC 4.79 08/17/2023 1139   HGB 14.2 08/17/2023 1139   HCT 41.6 08/17/2023 1139   PLT 246 08/17/2023 1139   MCV 86.8 08/17/2023 1139   MCH 29.6 08/17/2023 1139   MCHC 34.1 08/17/2023 1139   RDW 13.0 08/17/2023 1139   LYMPHSABS 3.0 11/18/2021 1451   MONOABS 0.6 11/18/2021 1451   EOSABS 0.3 11/18/2021 1451   BASOSABS 0.1 11/18/2021 1451    CMP     Component Value Date/Time   NA 136 08/17/2023 1139   K 4.0 08/17/2023 1139   CL 102 08/17/2023 1139   CO2 28 08/17/2023 1139   GLUCOSE 76 08/17/2023 1139   BUN 6 08/17/2023 1139   CREATININE 0.99 08/17/2023 1139   CALCIUM 9.1 08/17/2023 1139   PROT 6.8 08/17/2023 1139   ALBUMIN 4.2 08/17/2023 1139   AST 13 (L) 08/17/2023 1139   ALT 14 08/17/2023 1139   ALKPHOS 97 08/17/2023 1139   BILITOT 0.3 08/17/2023 1139   GFRNONAA >60 08/17/2023 1139   GFRAA >60 04/30/2020 1756     Assessment/Plan:      Epigastric Pain New onset since December, exacerbated by certain foods (acidic, spicy, greasy). Partial relief with pantoprazole  and Prevacid  (feels prevacid  works better). No dysphagia, melena, or hematochezia. Unintentional weight loss due to dietary changes. No ibuprofen/NSAID use. DDX includes esophagitis, gastritis,  PUD. Patient would prefer conservative management and would like to avoid EGD if possible. --Increase Prevacid  to 30mg  daily. --Order CBC, CMP to check for resolution of leukocytosis --Order H. pylori stool antigen test. --Consider endoscopy if symptoms persist or worsen despite PPI and lifestyle modifications. Provided patient education handouts.  Loose Stools Initially frequent and watery, now once every other day and soft-formed. Loose stools have resolved. Possible infectious etiology/gastroenteritis. -- if symptoms return can consider stool studies  General Health Maintenance -Plan for colonoscopy at age 78 (next January). Consider concurrent endoscopy if upper GI symptoms persist.      Suzanna Erp, PA-C [facility] 09/21/2023, 12:59 PM  Cc: Versa Gore, NP

## 2023-09-21 NOTE — Patient Instructions (Addendum)
 Your provider has requested that you go to the basement level for lab work before leaving today. Press "B" on the elevator. The lab is located at the first door on the left as you exit the elevator.   We have sent the following medications to your pharmacy for you to pick up at your convenience: Lansoprazole    Follow up in 6 months   If your blood pressure at your visit was 140/90 or greater, please contact your primary care physician to follow up on this.  _______________________________________________________  If you are age 44 or older, your body mass index should be between 23-30. Your Body mass index is 38.44 kg/m. If this is out of the aforementioned range listed, please consider follow up with your Primary Care Provider.  If you are age 44 or younger, your body mass index should be between 19-25. Your Body mass index is 38.44 kg/m. If this is out of the aformentioned range listed, please consider follow up with your Primary Care Provider.   ________________________________________________________  The [facility] providers would like to encourage you to use MYCHART to communicate with providers for non-urgent requests or questions.  Due to long hold times on the telephone, sending your provider a message by [facility] may be a faster and more efficient way to get a response.  Please allow 48 business hours for a response.  Please remember that this is for non-urgent requests.  _______________________________________________________  Due to recent changes in healthcare laws, you may see the results of your imaging and laboratory studies on MyChart before your provider has had a chance to review them.  We understand that in some cases there may be results that are confusing or concerning to you. Not all laboratory results come back in the same time frame and the provider may be waiting for multiple results in order to interpret others.  Please give us  48 hours in order for your provider to  thoroughly review all the results before contacting the office for clarification of your results.   Thank you for entrusting me with your care and choosing [facility].  Bayley Luan Rumpf, PA-C

## 2023-09-23 ENCOUNTER — Other Ambulatory Visit: Payer: MEDICAID

## 2023-09-23 DIAGNOSIS — R1013 Epigastric pain: Secondary | ICD-10-CM

## 2023-09-23 DIAGNOSIS — K219 Gastro-esophageal reflux disease without esophagitis: Secondary | ICD-10-CM

## 2023-09-25 LAB — H. PYLORI ANTIGEN, STOOL: H pylori Ag, Stl: NEGATIVE

## 2023-09-30 ENCOUNTER — Emergency Department (HOSPITAL_BASED_OUTPATIENT_CLINIC_OR_DEPARTMENT_OTHER)
Admission: EM | Admit: 2023-09-30 | Discharge: 2023-09-30 | Disposition: A | Discharge status: Home / Self Care | Payer: MEDICAID | Attending: Emergency Medicine | Admitting: Emergency Medicine

## 2023-09-30 ENCOUNTER — Other Ambulatory Visit: Payer: Self-pay

## 2023-09-30 ENCOUNTER — Encounter (HOSPITAL_BASED_OUTPATIENT_CLINIC_OR_DEPARTMENT_OTHER): Payer: Self-pay | Admitting: Emergency Medicine

## 2023-09-30 DIAGNOSIS — R519 Headache, unspecified: Secondary | ICD-10-CM | POA: Insufficient documentation

## 2023-09-30 MED ORDER — SODIUM CHLORIDE 0.9 % IV BOLUS
1000.0000 mL | Freq: Once | INTRAVENOUS | Status: AC
  Administered 2023-09-30: 1000 mL via INTRAVENOUS

## 2023-09-30 MED ORDER — DIPHENHYDRAMINE HCL 50 MG/ML IJ SOLN
12.5000 mg | Freq: Once | INTRAMUSCULAR | Status: AC
  Administered 2023-09-30: 12.5 mg via INTRAVENOUS
  Filled 2023-09-30: qty 1

## 2023-09-30 MED ORDER — DEXAMETHASONE SODIUM PHOSPHATE 10 MG/ML IJ SOLN
10.0000 mg | Freq: Once | INTRAMUSCULAR | Status: AC
  Administered 2023-09-30: 10 mg via INTRAVENOUS
  Filled 2023-09-30: qty 1

## 2023-09-30 MED ORDER — METOCLOPRAMIDE HCL 5 MG/ML IJ SOLN
10.0000 mg | Freq: Once | INTRAMUSCULAR | Status: AC
  Administered 2023-09-30: 10 mg via INTRAVENOUS
  Filled 2023-09-30: qty 2

## 2023-09-30 NOTE — Discharge Instructions (Signed)
It was a pleasure taking care of you here in the emergency department  Make sure to follow-up outpatient, return for any worsening symptoms

## 2023-09-30 NOTE — ED Notes (Signed)
Migraine HA unrelieved by Tylenol and Robaxin, no NSAID taken h/o stomach ulcer.

## 2023-09-30 NOTE — ED Triage Notes (Signed)
Migraine started this morning.\ No relief with home meds.

## 2023-09-30 NOTE — ED Provider Notes (Signed)
[facility] AT [facility] Provider Note   CSN: [id number] Arrival date & time: 09/30/23  1622     History  Chief Complaint  Patient presents with   Migraine    Gina James is a 43 y.o. female here for evaluation of headache.  States has history of migraines.  Headache today feels similar to prior headache.  Typically takes Tylenol, ibuprofen, muscle relaxers however did not take due to possible stomach ulcer.  She is currently followed by GI for this.  No bloody stool, abdominal symptoms have not worsened.  No vision changes, numbness, weakness, neck stiffness, fever, difficulty with word finding.  No recent head trauma.  No sudden onset thunderclap headache.  HPI     Home Medications Prior to Admission medications   Medication Sig Start Date End Date Taking? Authorizing Provider  Estradiol 10 MCG TABS vaginal tablet Place 1 tablet (10 mcg total) vaginally as directed. Start with pill inserted nightly for 2 weeks, then reduce to 3-4 nights per week. 06/08/23   Dulce Sellar, NP  famotidine (PEPCID) 40 MG tablet Take 1 tablet (40 mg total) by mouth at bedtime. To help reduce acid overnight and prevent am nausea. 06/08/23   Dulce Sellar, NP  fluconazole (DIFLUCAN) 150 MG tablet Take 1 pill today and the 2nd pill in 3 days. 05/01/23   Dulce Sellar, NP  lamoTRIgine (LAMICTAL) 200 MG tablet Take 1 tablet (200 mg total) by mouth at bedtime. 05/03/20 12/16/21  Karsten Ro, MD  lansoprazole (PREVACID) 30 MG capsule Take 1 capsule (30 mg total) by mouth daily at 12 noon. 09/21/23   McMichael, Bayley M, PA-C  LATUDA 60 MG TABS SMARTSIG:1 Tablet(s) By Mouth Every Evening 08/17/21   [provider]  levonorgestrel (MIRENA, 52 MG,) 20 MCG/DAY IUD 1 each by Intrauterine route once. 05/28/19 05/27/24  [provider]  LORazepam (ATIVAN) 1 MG tablet Take 1 mg by mouth daily as needed. 08/14/21   [provider]  meloxicam (MOBIC)  7.5 MG tablet TAKE 1 TO 2 TABLETS(7.5 TO 15 MG) BY MOUTH DAILY. DO NOT TAKE IBUPROFEN WITH THIS 01/04/22   Dulce Sellar, NP  methocarbamol (ROBAXIN) 500 MG tablet Take 1-2 tablets (500-1,000 mg total) by mouth every 8 (eight) hours as needed for muscle spasms. 02/14/23   Dulce Sellar, NP  oxyCODONE (OXY IR/ROXICODONE) 5 MG immediate release tablet Take 1 tablet (5 mg total) by mouth every 6 (six) hours as needed for severe pain (pain score 7-10). 08/17/23   Renne Crigler, PA-C  pantoprazole (PROTONIX) 40 MG tablet Take 1 tablet (40 mg total) by mouth in the morning and at bedtime. 09/06/23   Hudnell, Judeth Cornfield, NP  REXULTI 1 MG TABS tablet Take 1 mg by mouth daily. 09/16/23   [provider]  sucralfate (CARAFATE) 1 g tablet Take 1 tablet (1 g total) by mouth 4 (four) times daily -  with meals and at bedtime. 09/02/23   Dulce Sellar, NP  valACYclovir (VALTREX) 500 MG tablet For suppression: Take 1 pill daily, then 1 week prior to your menstrual cycle take 2 pills daily, then 2 days after your cycle reduce to 1 pill daily. For active episode: Take 4 tablets (total 2000 mg) by mouth q12h x 1 day; Start ASAP after symptom onset. 02/16/23   Dulce Sellar, NP  venlafaxine XR (EFFEXOR-XR) 75 MG 24 hr capsule Take 3 capsules (225 mg total) by mouth at bedtime. 05/03/20 09/21/23  Karsten Ro, MD  Allergies    Ciprofloxacin, Lexapro [escitalopram], Trintellix [vortioxetine], Amoxicillin, Ceclor [cefaclor], Erythromycin base, Penicillins, Phenergan [promethazine hcl], Sulfa antibiotics, and Zithromax [azithromycin]    Review of Systems   Review of Systems  Constitutional: Negative.   HENT: Negative.    Respiratory: Negative.    Cardiovascular: Negative.   Gastrointestinal: Negative.   Genitourinary: Negative.   Musculoskeletal: Negative.   Skin: Negative.   Neurological:  Positive for headaches. Negative for dizziness, tremors, seizures, syncope, facial asymmetry, speech  difficulty, weakness, light-headedness and numbness.  All other systems reviewed and are negative.   Physical Exam Updated Vital Signs BP 133/77   Pulse 70   Temp 98 F (36.7 C) (Oral)   Resp 18   Ht 5\' 5"  (1.651 m)   Wt 104.3 kg   LMP 08/16/2023   SpO2 95%   BMI 38.27 kg/m  Physical Exam Physical Exam  Constitutional: Pt is oriented Pt appears well-developed and well-nourished. No distress.  HENT:  Head: Normocephalic and atraumatic.  Mouth/Throat: Oropharynx is clear and moist.  Eyes: Conjunctivae and EOM are normal. PERLA No horizontal, vertical or rotational nystagmus  Neck: Normal range of motion. Neck supple.  Full active and passive ROM without pain No midline or paraspinal tenderness No nuchal rigidity or meningeal signs  Cardiovascular: Normal rate, regular rhythm and intact distal pulses.   Pulmonary/Chest: Effort normal and breath sounds normal. No respiratory distress.  Abdominal: no distention  Musculoskeletal: Normal range of motion.  Lymphadenopathy:    No cervical adenopathy.  Neurological: Pt. is alert and oriented to person, place, and time. He has normal reflexes. No cranial nerve deficit.  Exhibits normal muscle tone. Coordination normal.  Mental Status:  Alert, oriented, thought content appropriate. Speech fluent without evidence of aphasia. Able to follow 2 step commands without difficulty.  Cranial Nerves:  2-12 grossly intact Motor:  Equal strength BIL Sensory: =intact BIL Cerebellar: normal F2N Gait: normal gait and balance CV: distal pulses palpable throughout   Skin: Skin is warm and dry. No rash noted. Pt is not diaphoretic.  Psychiatric: Pt has a normal mood and affect. Behavior is normal. Judgment and thought content normal.  Nursing note and vitals reviewed.  ED Results / Procedures / Treatments   Labs (all labs ordered are listed, but only abnormal results are displayed) Labs Reviewed - No data to  display   EKG None  Radiology No results found.  Procedures Procedures    Medications Ordered in ED Medications  metoCLOPramide (REGLAN) injection 10 mg (10 mg Intravenous Given 09/30/23 1910)  diphenhydrAMINE (BENADRYL) injection 12.5 mg (12.5 mg Intravenous Given 09/30/23 1910)  dexamethasone (DECADRON) injection 10 mg (10 mg Intravenous Given 09/30/23 1911)  sodium chloride 0.9 % bolus 1,000 mL (0 mLs Intravenous Stopped 09/30/23 2031)    ED Course/ Medical Decision Making/ A&P   43 here for evaluation of migraine headache.  History of similar.  Feels like her prior headache.  No recent head trauma.  No sudden onset thunderclap headache.  She has a nonfocal neuroexam without deficits.  No fever, neck stiffness or rigidity.  Will treat with migraine cocktail and reassess  Patient reassessed.  Headache completely resolved.  Will have her follow-up outpatient, return for any worsening symptoms  Low suspicion for CVA, dissection, mass, IIH, acute angle-closure glaucoma, meningitis, trigeminal neuralgia, bacterial infectious source.  The patient has been appropriately medically screened and/or stabilized in the ED. I have low suspicion for any other emergent medical condition which would require further screening, evaluation  or treatment in the ED or require inpatient management.  Patient is hemodynamically stable and in no acute distress.  Patient able to ambulate in department prior to ED.  Evaluation does not show acute pathology that would require ongoing or additional emergent interventions while in the emergency department or further inpatient treatment.  I have discussed the diagnosis with the patient and answered all questions.  Pain is been managed while in the emergency department and patient has no further complaints prior to discharge.  Patient is comfortable with plan discussed in room and is stable for discharge at this time.  I have discussed strict return precautions for  returning to the emergency department.  Patient was encouraged to follow-up with PCP/specialist refer to at discharge.                                Medical Decision Making Amount and/or Complexity of Data Reviewed External Data Reviewed: labs, radiology and notes.  Risk OTC drugs. Prescription drug management. Parenteral controlled substances. Decision regarding hospitalization. Diagnosis or treatment significantly limited by social determinants of health.           Final Clinical Impression(s) / ED Diagnoses Final diagnoses:  Acute nonintractable headache, unspecified headache type    Rx / DC Orders ED Discharge Orders     None         Hazle Ogburn A, PA-C 09/30/23 2035    Laurence Spates, MD 10/01/23 1248

## 2023-10-10 ENCOUNTER — Ambulatory Visit (INDEPENDENT_AMBULATORY_CARE_PROVIDER_SITE_OTHER): Payer: MEDICAID | Admitting: Family

## 2023-10-10 ENCOUNTER — Encounter: Payer: Self-pay | Admitting: Family

## 2023-10-10 VITALS — BP 108/75 | HR 94 | Temp 97.8°F | Ht 65.0 in | Wt 231.0 lb

## 2023-10-10 DIAGNOSIS — L0291 Cutaneous abscess, unspecified: Secondary | ICD-10-CM | POA: Diagnosis not present

## 2023-10-10 MED ORDER — DOXYCYCLINE HYCLATE 100 MG PO TABS: 100.0000 mg | ORAL_TABLET | Freq: Two times a day (BID) | ORAL | 0 refills | Status: AC

## 2023-10-10 NOTE — Progress Notes (Signed)
Patient ID: Gina James, female    DOB: [birth year], 43 y.o.   MRN: [id number]  Chief Complaint  Patient presents with   Breast Mass    Pt c/o lump on right breast, present for years but has been worsening in the past week. Pt states mass is painful, red and swollen. Has tried hot compresses which did not help.        Discussed the use of AI scribe software for clinical note transcription with the patient, who gave verbal consent to proceed.  History of Present Illness   The patient presents with a lump above her right breast. She states she has had since 2016, which has increased in size by more than half over the past few months. The lump is located above the breast tissue on the chest area. The lump is tender and has developed an opening, draining pus. Attempts to express the contents have been unsuccessful and cause significant pain. She has not been able to alleviate the symptoms through self-management.     Assessment & Plan:     Chest abscess -  A tender, inflamed, and enlarging mass present since 2016 with recent purulent drainage. The mass is located above the right breast tissue on the chest wall. -Refer to surgical office for incision, drainage, and capsule removal. -Prescribing Doxycycline, but advised patient to consult with surgical office tomorrow if should start antibiotic or wait until seen in office. -Documented the mass with a photograph for surgical office reference. -Keep the area clean with soap & water.      Subjective:    Outpatient Medications Prior to Visit  Medication Sig Dispense Refill   Estradiol 10 MCG TABS vaginal tablet Place 1 tablet (10 mcg total) vaginally as directed. Start with pill inserted nightly for 2 weeks, then reduce to 3-4 nights per week. 20 tablet 5   famotidine (PEPCID) 40 MG tablet Take 1 tablet (40 mg total) by mouth at bedtime. To help reduce acid overnight and prevent am nausea. 30 tablet 2   fluconazole (DIFLUCAN) 150 MG tablet  Take 1 pill today and the 2nd pill in 3 days. 2 tablet 0   lansoprazole (PREVACID) 30 MG capsule Take 1 capsule (30 mg total) by mouth daily at 12 noon. 30 capsule 3   LATUDA 60 MG TABS SMARTSIG:1 Tablet(s) By Mouth Every Evening     levonorgestrel (MIRENA, 52 MG,) 20 MCG/DAY IUD 1 each by Intrauterine route once.     LORazepam (ATIVAN) 1 MG tablet Take 1 mg by mouth daily as needed.     meloxicam (MOBIC) 7.5 MG tablet TAKE 1 TO 2 TABLETS(7.5 TO 15 MG) BY MOUTH DAILY. DO NOT TAKE IBUPROFEN WITH THIS 60 tablet 0   methocarbamol (ROBAXIN) 500 MG tablet Take 1-2 tablets (500-1,000 mg total) by mouth every 8 (eight) hours as needed for muscle spasms. 120 tablet 2   pantoprazole (PROTONIX) 40 MG tablet Take 1 tablet (40 mg total) by mouth in the morning and at bedtime. 60 tablet 0   REXULTI 1 MG TABS tablet Take 1 mg by mouth daily.     sucralfate (CARAFATE) 1 g tablet Take 1 tablet (1 g total) by mouth 4 (four) times daily -  with meals and at bedtime. 60 tablet 0   valACYclovir (VALTREX) 500 MG tablet For suppression: Take 1 pill daily, then 1 week prior to your menstrual cycle take 2 pills daily, then 2 days after your cycle reduce to 1 pill daily. For  active episode: Take 4 tablets (total 2000 mg) by mouth q12h x 1 day; Start ASAP after symptom onset. 60 tablet 5   lamoTRIgine (LAMICTAL) 200 MG tablet Take 1 tablet (200 mg total) by mouth at bedtime. 30 tablet 0   oxyCODONE (OXY IR/ROXICODONE) 5 MG immediate release tablet Take 1 tablet (5 mg total) by mouth every 6 (six) hours as needed for severe pain (pain score 7-10). (Patient not taking: Reported on 10/10/2023) 8 tablet 0   venlafaxine XR (EFFEXOR-XR) 75 MG 24 hr capsule Take 3 capsules (225 mg total) by mouth at bedtime. 90 capsule 0   No facility-administered medications prior to visit.   Past Medical History:  Diagnosis Date   Ankle fracture, right    Anxiety    Arthritis    "in my back" (04/08/2015)   Cervical disc disorder with  radiculopathy of cervical region 10/21/2017   Cervical disc herniation 12/02/2017   Chronic lower back pain    Complication of anesthesia    difficulty urinating after anesthesia   Diarrhea    Headache    "monthly" (04/08/2015)   Lumbar pain with radiation down both legs 09/11/2013   Lumbar radiculopathy 04/01/2017   Formatting of this note might be different from the original.  Added automatically from request for surgery [id number]   Neck pain 12/02/2017   Post partum depression    Preventative health care 10/29/2014   S/P spinal surgery 08/12/2017   Past Surgical History:  Procedure Laterality Date   ANTERIOR FUSION LUMBAR SPINE  07/06/2017   BREAST SURGERY     CESAREAN SECTION  2007 and 2009   COMBINED ABDOMINOPLASTY AND LIPOSUCTION  12/2014   "liposuction in thighs"   FRACTURE SURGERY     LAPAROSCOPIC ABDOMINAL EXPLORATION  1997   "to check for endometriosis"   LAPAROSCOPIC CHOLECYSTECTOMY  2007   OPEN REDUCTION INTERNAL FIXATION (ORIF) TIBIA/FIBULA FRACTURE Right 04/08/2015   Procedure: OPEN REDUCTION INTERNAL FIXATION (ORIF)  RIGHT TIBIA PILON FRACTURE;  Surgeon: Toni Arthurs, MD;  Location: [facility] OR;  Service: Orthopedics;  Laterality: Right;   TIBIA FRACTURE SURGERY Right 04/08/2015   ORIF tibial pilon   Allergies  Allergen Reactions   Ciprofloxacin Hives   Lexapro [Escitalopram] Nausea And Vomiting   Trintellix [Vortioxetine]    Amoxicillin Hives and Other (See Comments)    Has patient had a PCN reaction causing immediate rash, facial/tongue/throat swelling, SOB or lightheadedness with hypotension: no Has patient had a PCN reaction causing severe rash involving mucus membranes or skin necrosis: no Has patient had a PCN reaction that required hospitalization: no Has patient had a PCN reaction occurring within the last 10 years: no - childhood reaction If all of the above answers are "NO", then may proceed with Cephalosporin use.    Ceclor [Cefaclor] Hives   Erythromycin Base  Rash   Penicillins Hives and Other (See Comments)    Has patient had a PCN reaction causing immediate rash, facial/tongue/throat swelling, SOB or lightheadedness with hypotension: no Has patient had a PCN reaction causing severe rash involving mucus membranes or skin necrosis: no Has patient had a PCN reaction that required hospitalization: no Has patient had a PCN reaction occurring within the last 10 years: no - childhood reaction If all of the above answers are "NO", then may proceed with Cephalosporin use.   Phenergan [Promethazine Hcl] Hives   Sulfa Antibiotics Hives   Zithromax [Azithromycin] Hives      Objective:    Physical Exam Vitals and nursing note  reviewed.  Constitutional:      Appearance: Normal appearance.  Cardiovascular:     Rate and Rhythm: Normal rate and regular rhythm.  Pulmonary:     Effort: Pulmonary effort is normal.     Breath sounds: Normal breath sounds.  Musculoskeletal:        General: Normal range of motion.  Skin:    General: Skin is warm and dry.     Findings: Abscess (right chest above breast, approx. 3cm in diameter, raised, with surrounding erythema) present.  Neurological:     Mental Status: She is alert.  Psychiatric:        Mood and Affect: Mood normal.        Behavior: Behavior normal.     BP 108/75 (BP Location: Left Arm, Patient Position: Sitting, Cuff Size: Large)   Pulse 94   Temp 97.8 F (36.6 C) (Temporal)   Ht 5\' 5"  (1.651 m)   Wt 231 lb (104.8 kg)   LMP 08/16/2023   SpO2 98%   BMI 38.44 kg/m  Wt Readings from Last 3 Encounters:  10/10/23 231 lb (104.8 kg)  09/30/23 230 lb (104.3 kg)  09/21/23 231 lb (104.8 kg)      Dulce Sellar, NP

## 2023-10-13 ENCOUNTER — Ambulatory Visit (INDEPENDENT_AMBULATORY_CARE_PROVIDER_SITE_OTHER): Payer: MEDICAID | Admitting: Surgery

## 2023-10-13 ENCOUNTER — Encounter: Payer: Self-pay | Admitting: Surgery

## 2023-10-13 VITALS — BP 119/80 | HR 83 | Temp 98.5°F | Resp 12 | Ht 65.0 in | Wt 232.0 lb

## 2023-10-13 DIAGNOSIS — L0291 Cutaneous abscess, unspecified: Secondary | ICD-10-CM | POA: Diagnosis not present

## 2023-10-13 NOTE — Patient Instructions (Signed)
 Wound Care Instructions:  -Change packing daily for at least 3-5 days -Remove packing in the shower.  Ok to let soapy water run over top of the incision.  Pat dry once out of the shower -Pack with iodoform gauze.  Use a Q-tip to pack incision site -Keep area covered with gauze or a bandage -Recommend following up with a primary care physician or urgent care for a wound recheck in 1 week.  If the area starts to get more red, tender, or you have fever/chills, present to urgent care or an emergency department.

## 2023-10-14 NOTE — Progress Notes (Signed)
 [facility] History and Physical  Reason for Referral: Right chest wall abscess Referring Physician: Corean Jungling, NP  Chief Complaint   New Patient (Initial Visit)     Gina James is a 43 y.o. female.  HPI: Patient presents for evaluation of a right chest wall abscess.  She has noted that she had a small bump in this area since 2016, but recently it has been increasing in size.  Starting just a few days ago, the area started to get more red and tender.  This is the first time this area is ever been infected.  She was started on doxycycline  by her primary care doctor, and has only taken 1 day of this medication.  She denies any other lumps or bumps anywhere else on her body.  She denies fevers and chills.  Her past medical history is significant for depression/anxiety, and GERD.  She denies use of blood thinning medications.  Her surgical history is significant for a diagnostic laparoscopy for endometriosis, 2 C-sections, laparoscopic cholecystectomy, and back surgery from an anterior approach.  She uses a vape daily and goes through a cartridge every 2 weeks.  She smokes marijuana daily.  She denies use of alcohol and other illicit drugs.  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  Added automatically from request for surgery [id number]   Neck pain 12/02/2017   Post partum depression    Preventative health care 10/29/2014   S/P spinal surgery 08/12/2017    Past Surgical History:   Procedure Laterality Date   ANTERIOR FUSION LUMBAR SPINE  07/06/2017   BREAST SURGERY     CESAREAN SECTION  2007 and 2009   COMBINED ABDOMINOPLASTY AND LIPOSUCTION  12/2014   liposuction in thighs   FRACTURE SURGERY     LAPAROSCOPIC ABDOMINAL EXPLORATION  1997   to check for endometriosis   LAPAROSCOPIC CHOLECYSTECTOMY  2007   OPEN REDUCTION INTERNAL FIXATION (ORIF) TIBIA/FIBULA FRACTURE Right 04/08/2015   Procedure: OPEN REDUCTION INTERNAL FIXATION (ORIF)  RIGHT TIBIA PILON FRACTURE;  Surgeon: Norleen Armor, MD;  Location: [facility] OR;  Service: Orthopedics;  Laterality: Right;   TIBIA FRACTURE SURGERY Right 04/08/2015   ORIF tibial pilon    Family History  Problem Relation Age of Onset   Diabetes Father    Cancer Sister 97       breast, had chemo/double mastectomy   Heart disease Maternal Grandfather    Heart disease Paternal Grandfather     Social History   Tobacco Use   Smoking status: Former    Current packs/day: 0.00    Average packs/day: 0.1 packs/day for 10.0 years (1.2 ttl pk-yrs)    Types: Cigarettes    Start date: 09/06/1994    Quit date: 09/06/2004    Years since quitting: 19.1   Smokeless tobacco: Never  Vaping Use   Vaping status: Some Days   Substances: Nicotine  Substance Use Topics   Alcohol use: Not Currently  Drug use: Yes    Types: Marijuana    Comment: daily    Medications: I have reviewed the patient's current medications. Allergies as of 10/13/2023       Reactions   Ciprofloxacin  Hives   Lexapro  [escitalopram ] Nausea And Vomiting   Trintellix [vortioxetine]    Amoxicillin Hives, Other (See Comments)   Has patient had a PCN reaction causing immediate rash, facial/tongue/throat swelling, SOB or lightheadedness with hypotension: no Has patient had a PCN reaction causing severe rash involving mucus membranes or skin necrosis: no Has patient had a PCN reaction that required hospitalization: no Has patient had a PCN reaction occurring within the last 10  years: no - childhood reaction If all of the above answers are NO, then may proceed with Cephalosporin use.   Ceclor [cefaclor] Hives   Erythromycin Base Rash   Penicillins Hives, Other (See Comments)   Has patient had a PCN reaction causing immediate rash, facial/tongue/throat swelling, SOB or lightheadedness with hypotension: no Has patient had a PCN reaction causing severe rash involving mucus membranes or skin necrosis: no Has patient had a PCN reaction that required hospitalization: no Has patient had a PCN reaction occurring within the last 10 years: no - childhood reaction If all of the above answers are NO, then may proceed with Cephalosporin use.   Phenergan [promethazine Hcl] Hives   Sulfa Antibiotics Hives   Zithromax [azithromycin] Hives        Medication List        Accurate as of October 13, 2023 11:59 PM. If you have any questions, ask your nurse or doctor.          doxycycline  100 MG tablet Commonly known as: VIBRA -TABS Take 1 tablet (100 mg total) by mouth 2 (two) times daily for 7 days.   Estradiol  10 MCG Tabs vaginal tablet Place 1 tablet (10 mcg total) vaginally as directed. Start with pill inserted nightly for 2 weeks, then reduce to 3-4 nights per week.   famotidine  40 MG tablet Commonly known as: PEPCID  Take 1 tablet (40 mg total) by mouth at bedtime. To help reduce acid overnight and prevent am nausea.   fluconazole  150 MG tablet Commonly known as: DIFLUCAN  Take 1 pill today and the 2nd pill in 3 days.   lamoTRIgine  200 MG tablet Commonly known as: LAMICTAL  Take 1 tablet (200 mg total) by mouth at bedtime.   lansoprazole  30 MG capsule Commonly known as: Prevacid  Take 1 capsule (30 mg total) by mouth daily at 12 noon.   Latuda 60 MG Tabs Generic drug: Lurasidone HCl SMARTSIG:1 Tablet(s) By Mouth Every Evening   LORazepam  1 MG tablet Commonly known as: ATIVAN  Take 1 mg by mouth daily as needed.   meloxicam  7.5 MG tablet Commonly  known as: MOBIC  TAKE 1 TO 2 TABLETS(7.5 TO 15 MG) BY MOUTH DAILY. DO NOT TAKE IBUPROFEN WITH THIS   methocarbamol  500 MG tablet Commonly known as: Robaxin  Take 1-2 tablets (500-1,000 mg total) by mouth every 8 (eight) hours as needed for muscle spasms.   Mirena  (52 MG) 20 MCG/DAY Iud Generic drug: levonorgestrel  1 each by Intrauterine route once.   oxyCODONE  5 MG immediate release tablet Commonly known as: Oxy IR/ROXICODONE  Take 1 tablet (5 mg total) by mouth every 6 (six) hours as needed for severe pain (pain score 7-10).   pantoprazole  40 MG tablet Commonly known as: Protonix  Take 1 tablet (40 mg total) by mouth in the morning and at bedtime.   Rexulti 1 MG  Tabs tablet Generic drug: brexpiprazole Take 1 mg by mouth daily.   sucralfate  1 g tablet Commonly known as: Carafate  Take 1 tablet (1 g total) by mouth 4 (four) times daily -  with meals and at bedtime.   valACYclovir  500 MG tablet Commonly known as: VALTREX  For suppression: Take 1 pill daily, then 1 week prior to your menstrual cycle take 2 pills daily, then 2 days after your cycle reduce to 1 pill daily. For active episode: Take 4 tablets (total 2000 mg) by mouth q12h x 1 day; Start ASAP after symptom onset.   venlafaxine  XR 75 MG 24 hr capsule Commonly known as: EFFEXOR -XR Take 3 capsules (225 mg total) by mouth at bedtime.         ROS:  Constitutional: negative for chills, fatigue, and fevers Eyes: negative for visual disturbance and pain Ears, nose, mouth, throat, and face: negative for ear drainage, sore throat, and sinus problems Respiratory: negative for cough, wheezing, and shortness of breath Cardiovascular: negative for chest pain and palpitations Gastrointestinal: negative for abdominal pain, nausea, reflux symptoms, and vomiting Genitourinary:negative for dysuria and frequency Integument/breast: negative for dryness and rash Hematologic/lymphatic: negative for bleeding and  lymphadenopathy Musculoskeletal:negative for back pain and neck pain Neurological: negative for dizziness and tremors Endocrine: negative for temperature intolerance  Blood pressure 119/80, pulse 83, temperature 98.5 F (36.9 C), temperature source Oral, resp. rate 12, height 5' 5 (1.651 m), weight 232 lb (105.2 kg), last menstrual period 08/16/2023, SpO2 92%. Physical Exam Vitals reviewed.  Constitutional:      Appearance: Normal appearance.  HENT:     Head: Normocephalic and atraumatic.  Eyes:     Extraocular Movements: Extraocular movements intact.     Pupils: Pupils are equal, round, and reactive to light.  Cardiovascular:     Rate and Rhythm: Normal rate and regular rhythm.  Pulmonary:     Effort: Pulmonary effort is normal.     Breath sounds: Normal breath sounds.  Chest:     Comments: Right chest with 3 cm area of erythema, swelling, and induration with central area of fluctuance; located at the 12 o'clock position superiorly on the right breast Abdominal:     General: There is no distension.     Palpations: Abdomen is soft.     Tenderness: There is no abdominal tenderness.  Musculoskeletal:        General: Normal range of motion.     Cervical back: Normal range of motion.  Skin:    General: Skin is warm and dry.  Neurological:     General: No focal deficit present.     Mental Status: She is alert and oriented to person, place, and time.  Psychiatric:        Mood and Affect: Mood normal.        Behavior: Behavior normal.     Results: No results found for this or any previous visit (from the past 48 hours).  No results found.   Assessment & Plan:  Gina James is a 43 y.o. female who presents for evaluation of a right chest abscess  -I discussed with the patient that she likely has a cyst on her right chest that has subsequently become infected.  I would recommend incision and drainage of this abscess at this time -The risk and benefits of incision and  drainage of right chest abscess were discussed including but not limited to bleeding, infection, injury to surrounding structures, and need for additional procedures.  After careful  consideration, Gina James has decided to bedside incision and drainage.  -Please refer below for details of procedure -Advised that she should continue taking her doxycycline  -Wound care instructions provided to the patient.  Advised that at least for the next few days, she should be packing this incision and drainage site with iodoform packing. -Patient and her husband are moving to [name]  in the next day or 2.  Advised that she should have somebody look at this wound within the next week to verify the infection is improving.  Advised that she could try to get in with a new primary care doctor in [name]  versus going to an urgent care for evaluation of the area  All questions were answered to the satisfaction of the patient and family.   Bedside procedure note   Preoperative Diagnosis: Right chest abscess Postoperative Diagnosis: Right chest abscess   Procedure(s) Performed: Incision and drainage of right chest abscess   Performing provider: Dorothyann Brittle, DO   Estimated Blood Loss: Minimal   Findings: Right chest abscess   Procedure: At bedside, right chest was prepped with Betadine and draped with OR towels.  Verbal consent obtained from the patient prior to beginning of procedure.  Timeout was performed.  Area was localized with 1% lidocaine  with epinephrine .  Single straight incision was made over top of the abscess at an area of fluctuance.  A copious amount of purulent drainage was evacuated.  Wound culture was obtained.  The cavity was irrigated with saline.  Hemostasis was noted.  The cavity was then packed with quarter inch iodoform packing and dressed with 4 x 4 and tape.  Patient tolerated the procedure without issue.    Dorothyann Brittle, DO [facility] [address] [place], [place] [address] [phone] (office)

## 2023-10-16 LAB — WOUND CULTURE
MICRO NUMBER:: 16052106
SPECIMEN QUALITY:: ADEQUATE
